# Patient Record
Sex: Female | Born: 1990 | Race: Black or African American | Hispanic: No | Marital: Single | Smoking: Former smoker
Health system: Southern US, Community
[De-identification: ages and names within clinical notes are randomized; demographics above are authoritative.]

## PROBLEM LIST (undated history)

## (undated) ENCOUNTER — Inpatient Hospital Stay (HOSPITAL_COMMUNITY): Payer: Self-pay

## (undated) DIAGNOSIS — E669 Obesity, unspecified: Secondary | ICD-10-CM

## (undated) DIAGNOSIS — O24419 Gestational diabetes mellitus in pregnancy, unspecified control: Secondary | ICD-10-CM

## (undated) DIAGNOSIS — F329 Major depressive disorder, single episode, unspecified: Secondary | ICD-10-CM

## (undated) DIAGNOSIS — F410 Panic disorder [episodic paroxysmal anxiety] without agoraphobia: Secondary | ICD-10-CM

## (undated) DIAGNOSIS — D473 Essential (hemorrhagic) thrombocythemia: Secondary | ICD-10-CM

## (undated) DIAGNOSIS — E119 Type 2 diabetes mellitus without complications: Secondary | ICD-10-CM

## (undated) DIAGNOSIS — A5901 Trichomonal vulvovaginitis: Secondary | ICD-10-CM

## (undated) DIAGNOSIS — F32A Depression, unspecified: Secondary | ICD-10-CM

## (undated) HISTORY — PX: TONSILLECTOMY AND ADENOIDECTOMY: SHX28

## (undated) HISTORY — PX: MOUTH SURGERY: SHX715

## (undated) HISTORY — PX: TONSILLECTOMY: SUR1361

---
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
## 2009-02-22 ENCOUNTER — Inpatient Hospital Stay (HOSPITAL_COMMUNITY): Admission: AD | Admit: 2009-02-22 | Discharge: 2009-02-22 | Payer: Self-pay | Admitting: Obstetrics

## 2009-02-22 ENCOUNTER — Ambulatory Visit: Payer: Self-pay | Admitting: Obstetrics and Gynecology

## 2009-03-23 ENCOUNTER — Ambulatory Visit (HOSPITAL_COMMUNITY): Admission: RE | Admit: 2009-03-23 | Discharge: 2009-03-23 | Payer: Self-pay | Admitting: Obstetrics

## 2009-04-26 ENCOUNTER — Inpatient Hospital Stay (HOSPITAL_COMMUNITY): Admission: AD | Admit: 2009-04-26 | Discharge: 2009-04-26 | Payer: Self-pay | Admitting: Obstetrics

## 2009-05-14 ENCOUNTER — Inpatient Hospital Stay (HOSPITAL_COMMUNITY): Admission: AD | Admit: 2009-05-14 | Discharge: 2009-05-14 | Payer: Self-pay | Admitting: Obstetrics

## 2009-05-16 ENCOUNTER — Inpatient Hospital Stay (HOSPITAL_COMMUNITY): Admission: AD | Admit: 2009-05-16 | Discharge: 2009-05-16 | Payer: Self-pay | Admitting: Obstetrics

## 2009-05-26 ENCOUNTER — Inpatient Hospital Stay (HOSPITAL_COMMUNITY): Admission: AD | Admit: 2009-05-26 | Discharge: 2009-05-28 | Payer: Self-pay | Admitting: Obstetrics

## 2009-06-08 ENCOUNTER — Inpatient Hospital Stay (HOSPITAL_COMMUNITY): Admission: AD | Admit: 2009-06-08 | Discharge: 2009-06-09 | Payer: Self-pay | Admitting: Obstetrics

## 2009-07-10 ENCOUNTER — Encounter: Admission: RE | Admit: 2009-07-10 | Discharge: 2009-07-10 | Payer: Self-pay | Admitting: Family Medicine

## 2009-12-18 ENCOUNTER — Emergency Department (HOSPITAL_COMMUNITY): Admission: EM | Admit: 2009-12-18 | Discharge: 2009-12-18 | Payer: Self-pay | Admitting: Emergency Medicine

## 2010-01-31 ENCOUNTER — Emergency Department (HOSPITAL_COMMUNITY): Admission: EM | Admit: 2010-01-31 | Discharge: 2010-01-31 | Payer: Self-pay | Admitting: Emergency Medicine

## 2010-05-31 ENCOUNTER — Emergency Department (HOSPITAL_COMMUNITY): Admission: EM | Admit: 2010-05-31 | Discharge: 2010-05-31 | Payer: Self-pay | Admitting: Emergency Medicine

## 2010-11-08 LAB — POCT CARDIAC MARKERS
CKMB, poc: <1 ng/mL — ABNORMAL LOW (ref 1.0–8.0)
Myoglobin, poc: 78 ng/mL (ref 12–200)
Troponin i, poc: <0.05 ng/mL (ref 0.00–0.09)

## 2010-11-08 LAB — POCT I-STAT, CHEM 8
BUN: 5 mg/dL — ABNORMAL LOW (ref 6–23)
Calcium, Ion: 1.11 mmol/L — ABNORMAL LOW (ref 1.12–1.32)
Chloride: 104 mEq/L (ref 96–112)
Creatinine, Ser: 1 mg/dL (ref 0.4–1.2)
Glucose, Bld: 90 mg/dL (ref 70–99)
HCT: 42 % (ref 36.0–46.0)
Hemoglobin: 14.3 g/dL (ref 12.0–15.0)
Potassium: 3.6 mEq/L (ref 3.5–5.1)
Sodium: 140 mEq/L (ref 135–145)
TCO2: 26 mmol/L (ref 0–100)

## 2010-11-08 LAB — POCT PREGNANCY, URINE: Preg Test, Ur: NEGATIVE

## 2010-11-08 LAB — D-DIMER, QUANTITATIVE: D-Dimer, Quant: 0.64 ug/mL-FEU — ABNORMAL HIGH (ref 0.00–0.48)

## 2010-11-25 LAB — CBC
HCT: 31.1 % — ABNORMAL LOW (ref 36.0–46.0)
HCT: 35.7 % — ABNORMAL LOW (ref 36.0–46.0)
Hemoglobin: 10.2 g/dL — ABNORMAL LOW (ref 12.0–15.0)
Hemoglobin: 11.5 g/dL — ABNORMAL LOW (ref 12.0–15.0)
MCHC: 32.2 g/dL (ref 30.0–36.0)
MCHC: 32.9 g/dL (ref 30.0–36.0)
MCV: 82.5 fL (ref 78.0–100.0)
MCV: 82.7 fL (ref 78.0–100.0)
Platelets: 354 10*3/uL (ref 150–400)
Platelets: 375 10*3/uL (ref 150–400)
RBC: 3.78 MIL/uL — ABNORMAL LOW (ref 3.87–5.11)
RBC: 4.32 MIL/uL (ref 3.87–5.11)
RDW: 13.6 % (ref 11.5–15.5)
RDW: 14.4 % (ref 11.5–15.5)
WBC: 11.9 10*3/uL — ABNORMAL HIGH (ref 4.0–10.5)
WBC: 21.2 10*3/uL — ABNORMAL HIGH (ref 4.0–10.5)

## 2010-11-25 LAB — CULTURE, ROUTINE-ABSCESS

## 2010-11-25 LAB — RPR: RPR Ser Ql: NONREACTIVE

## 2010-11-26 LAB — URINE CULTURE: Colony Count: 2000

## 2010-11-26 LAB — URINALYSIS, ROUTINE W REFLEX MICROSCOPIC
Bilirubin Urine: NEGATIVE
Glucose, UA: NEGATIVE mg/dL
Hgb urine dipstick: NEGATIVE
Ketones, ur: NEGATIVE mg/dL
Nitrite: NEGATIVE
Protein, ur: NEGATIVE mg/dL
Specific Gravity, Urine: 1.025 (ref 1.005–1.030)
Urobilinogen, UA: 0.2 mg/dL (ref 0.0–1.0)
pH: 6 (ref 5.0–8.0)

## 2010-11-26 LAB — GC/CHLAMYDIA PROBE AMP, GENITAL
Chlamydia, DNA Probe: NEGATIVE
GC Probe Amp, Genital: NEGATIVE

## 2010-11-26 LAB — WET PREP, GENITAL: Trich, Wet Prep: NONE SEEN

## 2010-11-26 LAB — URINE MICROSCOPIC-ADD ON

## 2010-11-28 LAB — CBC
HCT: 36.5 % (ref 36.0–46.0)
Hemoglobin: 12.3 g/dL (ref 12.0–15.0)
MCHC: 33.6 g/dL (ref 30.0–36.0)
MCV: 85 fL (ref 78.0–100.0)
Platelets: 372 10*3/uL (ref 150–400)
RBC: 4.3 MIL/uL (ref 3.87–5.11)
RDW: 13.1 % (ref 11.5–15.5)
WBC: 11.1 10*3/uL — ABNORMAL HIGH (ref 4.0–10.5)

## 2010-11-28 LAB — KLEIHAUER-BETKE STAIN
Fetal Cells %: 0 %
Quantitation Fetal Hemoglobin: 0 mL

## 2010-12-01 LAB — URINALYSIS, ROUTINE W REFLEX MICROSCOPIC
Bilirubin Urine: NEGATIVE
Glucose, UA: NEGATIVE mg/dL
Hgb urine dipstick: NEGATIVE
Ketones, ur: NEGATIVE mg/dL
Nitrite: NEGATIVE
Protein, ur: NEGATIVE mg/dL
Specific Gravity, Urine: 1.02 (ref 1.005–1.030)
Urobilinogen, UA: 0.2 mg/dL (ref 0.0–1.0)
pH: 7.5 (ref 5.0–8.0)

## 2010-12-01 LAB — GC/CHLAMYDIA PROBE AMP, GENITAL
Chlamydia, DNA Probe: NEGATIVE
GC Probe Amp, Genital: NEGATIVE

## 2010-12-01 LAB — URINE MICROSCOPIC-ADD ON

## 2010-12-01 LAB — WET PREP, GENITAL: Clue Cells Wet Prep HPF POC: NONE SEEN

## 2010-12-04 ENCOUNTER — Emergency Department (HOSPITAL_COMMUNITY)
Admission: EM | Admit: 2010-12-04 | Discharge: 2010-12-05 | Discharge status: Against Medical Advice | Payer: Self-pay | Attending: Emergency Medicine | Admitting: Emergency Medicine

## 2010-12-06 LAB — POCT CARDIAC MARKERS
CKMB, poc: 1.6 ng/mL (ref 1.0–8.0)
Myoglobin, poc: 68.8 ng/mL (ref 12–200)
Troponin i, poc: <0.05 ng/mL (ref 0.00–0.09)

## 2010-12-06 LAB — RAPID URINE DRUG SCREEN, HOSP PERFORMED
Amphetamines: NOT DETECTED
Barbiturates: NOT DETECTED
Benzodiazepines: NOT DETECTED
Cocaine: NOT DETECTED
Opiates: NOT DETECTED
Tetrahydrocannabinol: NOT DETECTED

## 2010-12-06 LAB — POCT PREGNANCY, URINE: Preg Test, Ur: NEGATIVE

## 2010-12-06 LAB — URINALYSIS, ROUTINE W REFLEX MICROSCOPIC
Bilirubin Urine: NEGATIVE
Glucose, UA: NEGATIVE mg/dL
Hgb urine dipstick: NEGATIVE
Ketones, ur: NEGATIVE mg/dL
Nitrite: NEGATIVE
Protein, ur: NEGATIVE mg/dL
Specific Gravity, Urine: 1.002 — ABNORMAL LOW (ref 1.005–1.030)
Urobilinogen, UA: 0.2 mg/dL (ref 0.0–1.0)
pH: 7.5 (ref 5.0–8.0)

## 2010-12-06 LAB — POCT I-STAT, CHEM 8
BUN: 5 mg/dL — ABNORMAL LOW (ref 6–23)
Calcium, Ion: 1.21 mmol/L (ref 1.12–1.32)
Chloride: 100 mEq/L (ref 96–112)
Creatinine, Ser: 1 mg/dL (ref 0.4–1.2)
Glucose, Bld: 98 mg/dL (ref 70–99)
HCT: 44 % (ref 36.0–46.0)
Hemoglobin: 15 g/dL (ref 12.0–15.0)
Potassium: 3.6 mEq/L (ref 3.5–5.1)
Sodium: 142 mEq/L (ref 135–145)
TCO2: 28 mmol/L (ref 0–100)

## 2010-12-06 LAB — ETHANOL: Alcohol, Ethyl (B): <5 mg/dL (ref 0–10)

## 2011-01-04 NOTE — Op Note (Signed)
[name], [name]             ACCOUNT NO.:  [id number]   MEDICAL RECORD NO.:  [id number]          PATIENT TYPE:  AMB   LOCATION:  DSC                          FACILITY:  [facility]   PHYSICIAN:  Christopher E. Ezzard Standing, M.D.DATE OF BIRTH:  [birth year]   DATE OF PROCEDURE:  07/17/2007  DATE OF DISCHARGE:                               OPERATIVE REPORT   PREOPERATIVE DIAGNOSES:  1. Recurrent tonsillitis.  2. Adenotonsillar hypertrophy.   POSTOPERATIVE DIAGNOSES:  1. Recurrent tonsillitis.  2. Adenotonsillar hypertrophy.   OPERATION:  Tonsillectomy and adenoidectomy.   SURGEON:  Kristine Garbe. Ezzard Standing, M.D.   ANESTHESIA:  General endotracheal.   COMPLICATIONS:  None.   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  She has missed several days of  school because of recurrent tonsil infections.  On exam she has large 2+  tonsils as well as some large adenoid tissue.  She is taken to the  operating room at this time for tonsillectomy and adenoidectomy.   DESCRIPTION OF PROCEDURE:  After adequate endotracheal anesthesia,  Phyllis Dalton received 1 g of Ancef IV preoperatively as well as 10 mg of  Decadron.  A mouth gag was used to expose the oropharynx.  The left and  right tonsils were then resected from the tonsillar fossa using cautery.  Care was taken to preserve the anterior and posterior tonsillar pillars  as well as the uvula.  Hemostasis was obtained with the cautery.  Following this, a rubber catheter was passed through the nose and out  the mouth retract the soft palate.  The nasopharynx was examined.  Phyllis Dalton had large adenoid tissue.  An adenoid curette was used to remove  the central pad of adenoid tissue.  Nasopharyngeal packs were placed for  hemostasis.  These were then removed, and further hemostasis was  obtained with suction cautery.  At  completion of adenoidectomy, the nose  and nasopharynx were irrigated with saline.  This completed the  procedure.  Phyllis Dalton was awakened from anesthesia and transferred to the  recovery room postoperatively, doing well.   DISPOSITION:  Phyllis Dalton will be observed overnight in the recovery care  center and discharged home the morning on amoxicillin suspension 400 mg  t.i.d. for 1 week, Tylenol or Lortab elixir 1 to 1-1/2 tbsp q.4 h.  p.r.n. pain.  I will have her follow up in my office in 2 weeks for  recheck..           ______________________________  Kristine Garbe Ezzard Standing, M.D.     CEN/MEDQ  D:  07/17/2007  T:  07/17/2007  Job:  [id number]   cc:   Fanny Dance, M.D.

## 2011-05-24 LAB — DIFFERENTIAL
Basophils Absolute: 0.1
Basophils Relative: 1
Eosinophils Absolute: 0.1
Eosinophils Relative: 1
Lymphocytes Relative: 22
Lymphs Abs: 1.6
Monocytes Absolute: 0.8
Monocytes Relative: 10
Neutro Abs: 5
Neutrophils Relative %: 66

## 2011-05-24 LAB — CBC
HCT: 38.9
Hemoglobin: 12.7
MCHC: 32.6
MCV: 83.5
Platelets: 399
RBC: 4.66
RDW: 13.1
WBC: 7.6

## 2011-05-24 LAB — BASIC METABOLIC PANEL
BUN: 10
CO2: 27
Calcium: 9.4
Chloride: 105
Creatinine, Ser: 0.82
GFR calc Af Amer: >60
GFR calc non Af Amer: >60
Glucose, Bld: 78
Potassium: 4.2
Sodium: 138

## 2011-05-24 LAB — D-DIMER, QUANTITATIVE: D-Dimer, Quant: 0.45

## 2011-05-31 LAB — POCT HEMOGLOBIN-HEMACUE
Hemoglobin: 13.3
Operator id: 116011

## 2011-07-16 ENCOUNTER — Encounter: Payer: Self-pay | Admitting: *Deleted

## 2011-07-16 ENCOUNTER — Emergency Department (HOSPITAL_COMMUNITY)
Admission: EM | Admit: 2011-07-16 | Discharge: 2011-07-16 | Disposition: A | Discharge status: Home / Self Care | Payer: Self-pay | Attending: Emergency Medicine | Admitting: Emergency Medicine

## 2011-07-16 DIAGNOSIS — IMO0001 Reserved for inherently not codable concepts without codable children: Secondary | ICD-10-CM | POA: Insufficient documentation

## 2011-07-16 DIAGNOSIS — R05 Cough: Secondary | ICD-10-CM | POA: Insufficient documentation

## 2011-07-16 DIAGNOSIS — J069 Acute upper respiratory infection, unspecified: Secondary | ICD-10-CM | POA: Insufficient documentation

## 2011-07-16 DIAGNOSIS — F17200 Nicotine dependence, unspecified, uncomplicated: Secondary | ICD-10-CM | POA: Insufficient documentation

## 2011-07-16 DIAGNOSIS — R059 Cough, unspecified: Secondary | ICD-10-CM | POA: Insufficient documentation

## 2011-07-16 MED ORDER — IBUPROFEN 800 MG PO TABS: 800.0000 mg | ORAL_TABLET | Freq: Three times a day (TID) | ORAL | Status: AC

## 2011-07-16 MED ORDER — AZITHROMYCIN 250 MG PO TABS: ORAL_TABLET | ORAL | Status: AC

## 2011-07-16 MED ORDER — IBUPROFEN 800 MG PO TABS
800.0000 mg | ORAL_TABLET | Freq: Once | ORAL | Status: AC
  Administered 2011-07-16: 800 mg via ORAL
  Filled 2011-07-16: qty 1

## 2011-07-16 NOTE — ED Provider Notes (Signed)
History     CSN: [id number] Arrival date & time: 07/16/2011 12:53 PM   First MD Initiated Contact with Patient 07/16/11 1400      Chief Complaint  Patient presents with  . Chills  . Generalized Body Aches  . Headache    (Consider location/radiation/quality/duration/timing/severity/associated sxs/prior treatment) HPI  Patient presents to emergency department complaining of a few day history of overall body aches, sinus pressure, nasal drainage, and intermittent headaches. Patient has taken over-the-counter cough and cold remedies without relief of symptoms. Patient states she's felt feverish but no known temperature. Denies neck stiffness, rash, nausea, vomiting, chest pain, shortness of breath, wheezing, abdominal pain, nausea, vomiting, diarrhea. Patient states she takes no medicine on a regular basis has no known medical problems. Denies aggravating or alleviating factors. The bones were gradual onset, persistent, and worsening.  History reviewed. No pertinent past medical history.  History reviewed. No pertinent past surgical history.  No family history on file.  History  Substance Use Topics  . Smoking status: Current Everyday Smoker  . Smokeless tobacco: Not on file  . Alcohol Use: Yes    OB History    Grav Para Term Preterm Abortions TAB SAB Ect Mult Living                  Review of Systems  All other systems reviewed and are negative.    Allergies  Review of patient's allergies indicates no known allergies.  Home Medications   Current Outpatient Rx  Name Route Sig Dispense Refill  . DIPHENHYDRAMINE HCL 25 MG PO CAPS Oral Take 50 mg by mouth every 6 (six) hours as needed. For decongestant.       BP 128/78  Pulse 108  Temp(Src) 98.4 F (36.9 C) (Oral)  Resp 23  SpO2 99%  Physical Exam  Vitals reviewed. Constitutional: She is oriented to person, place, and time. She appears well-developed and well-nourished. No distress.  HENT:  Head:  Normocephalic and atraumatic.  Right Ear: External ear normal.  Left Ear: External ear normal.  Nose: Nose normal.  Mouth/Throat: No oropharyngeal exudate.       Mild erythema of posterior pharynx and tonsils no tonsillar exudate or enlargement. Patent airway. Swallowing secretions well  Eyes: Conjunctivae and EOM are normal. Pupils are equal, round, and reactive to light.  Neck: Normal range of motion. Neck supple.  Cardiovascular: Normal rate, regular rhythm and normal heart sounds.  Exam reveals no gallop and no friction rub.   No murmur heard. Pulmonary/Chest: Effort normal and breath sounds normal. No respiratory distress. She has no wheezes. She has no rales. She exhibits no tenderness.  Abdominal: Soft. She exhibits no distension and no mass. There is no tenderness. There is no rebound and no guarding.  Lymphadenopathy:    She has no cervical adenopathy.  Neurological: She is alert and oriented to person, place, and time. She has normal reflexes.  Skin: Skin is warm and dry. No rash noted. She is not diaphoretic.  Psychiatric: She has a normal mood and affect.    ED Course  Procedures (including critical care time)  Labs Reviewed - No data to display No results found.   1. Cough   2. Upper respiratory tract infection       MDM  Patient is afebrile, nontoxic-appearing, in no acute distress. Pulse ox is 99% on room air. Patient has signs and symptoms of upper respiratory tract infection with cough. Patient is requesting antibiotic. Spoke at length with patient about  likely origin of upper respiratory tract infections being viral however patient insists on antibiotics.        Jenness Corner, [place] 07/16/11 1555

## 2011-07-16 NOTE — ED Notes (Signed)
Upper resp symptoms with chills, body aches, headache,

## 2011-07-16 NOTE — ED Provider Notes (Signed)
Medical screening examination/treatment/procedure(s) were performed by non-physician practitioner and as supervising physician I was immediately available for consultation/collaboration. Iva Knapp, MD, FACEP   Iva L Knapp, MD 07/16/11 1610 

## 2011-10-04 ENCOUNTER — Encounter (HOSPITAL_COMMUNITY): Payer: Self-pay | Admitting: *Deleted

## 2011-10-04 ENCOUNTER — Emergency Department (HOSPITAL_COMMUNITY)
Admission: EM | Admit: 2011-10-04 | Discharge: 2011-10-05 | Disposition: A | Discharge status: Home / Self Care | Payer: Medicaid Other | Attending: Emergency Medicine | Admitting: Emergency Medicine

## 2011-10-04 DIAGNOSIS — B3731 Acute candidiasis of vulva and vagina: Secondary | ICD-10-CM | POA: Insufficient documentation

## 2011-10-04 DIAGNOSIS — F17200 Nicotine dependence, unspecified, uncomplicated: Secondary | ICD-10-CM | POA: Insufficient documentation

## 2011-10-04 DIAGNOSIS — A599 Trichomoniasis, unspecified: Secondary | ICD-10-CM

## 2011-10-04 DIAGNOSIS — M549 Dorsalgia, unspecified: Secondary | ICD-10-CM | POA: Insufficient documentation

## 2011-10-04 DIAGNOSIS — B373 Candidiasis of vulva and vagina: Secondary | ICD-10-CM

## 2011-10-04 DIAGNOSIS — R109 Unspecified abdominal pain: Secondary | ICD-10-CM | POA: Insufficient documentation

## 2011-10-04 DIAGNOSIS — N898 Other specified noninflammatory disorders of vagina: Secondary | ICD-10-CM | POA: Insufficient documentation

## 2011-10-04 LAB — URINALYSIS, ROUTINE W REFLEX MICROSCOPIC
Bilirubin Urine: NEGATIVE
Glucose, UA: NEGATIVE mg/dL
Hgb urine dipstick: NEGATIVE
Ketones, ur: NEGATIVE mg/dL
Nitrite: NEGATIVE
Protein, ur: NEGATIVE mg/dL
Specific Gravity, Urine: 1.025 (ref 1.005–1.030)
Urobilinogen, UA: 0.2 mg/dL (ref 0.0–1.0)
pH: 7.5 (ref 5.0–8.0)

## 2011-10-04 LAB — URINE MICROSCOPIC-ADD ON

## 2011-10-04 LAB — PREGNANCY, URINE: Preg Test, Ur: NEGATIVE

## 2011-10-04 NOTE — ED Notes (Signed)
MD at bedside. 

## 2011-10-04 NOTE — ED Notes (Signed)
Pt in c/o generalized abd pain and lower back pain, also vaginal discharge and constipation

## 2011-10-05 LAB — WET PREP, GENITAL: Yeast Wet Prep HPF POC: NONE SEEN

## 2011-10-05 LAB — GC/CHLAMYDIA PROBE AMP, GENITAL
Chlamydia, DNA Probe: NEGATIVE
GC Probe Amp, Genital: NEGATIVE

## 2011-10-05 MED ORDER — ONDANSETRON 4 MG PO TBDP
4.0000 mg | ORAL_TABLET | Freq: Once | ORAL | Status: AC
  Administered 2011-10-05: 4 mg via ORAL
  Filled 2011-10-05: qty 1

## 2011-10-05 MED ORDER — AZITHROMYCIN 250 MG PO TABS
1000.0000 mg | ORAL_TABLET | Freq: Once | ORAL | Status: AC
  Administered 2011-10-05: 1000 mg via ORAL
  Filled 2011-10-05: qty 4

## 2011-10-05 MED ORDER — LIDOCAINE HCL 1 % IJ SOLN
INTRAMUSCULAR | Status: AC
  Administered 2011-10-05: 01:00:00
  Filled 2011-10-05: qty 20

## 2011-10-05 MED ORDER — FLUCONAZOLE 200 MG PO TABS: 200.0000 mg | ORAL_TABLET | Freq: Every day | ORAL | Status: AC

## 2011-10-05 MED ORDER — METRONIDAZOLE 500 MG PO TABS: 500.0000 mg | ORAL_TABLET | Freq: Three times a day (TID) | ORAL | Status: AC

## 2011-10-05 MED ORDER — CEFTRIAXONE SODIUM 250 MG IJ SOLR
250.0000 mg | Freq: Once | INTRAMUSCULAR | Status: AC
  Administered 2011-10-05: 250 mg via INTRAMUSCULAR
  Filled 2011-10-05: qty 250

## 2011-10-05 NOTE — ED Provider Notes (Signed)
History    21 year old female with lower abdominal pain. Gradual onset yesterday. Crampy in nature without radiation. Pain does not lateralize. Patient is also complaining of thick white vaginal discharge which is going on for couple days. No urinary complaints. No fever or chills. Last menstrual period was the end of January and normal for her. Does not think she is pregnant. No fever or chills. Denies history of abdominal or pelvic surgery. No nausea or vomiting  CSN: [id number]  Arrival date & time 10/04/11  1949   First MD Initiated Contact with Patient 10/04/11 2213      Chief Complaint  Patient presents with  . Abdominal Pain  . Back Pain    (Consider location/radiation/quality/duration/timing/severity/associated sxs/prior treatment) HPI  History reviewed. No pertinent past medical history.  History reviewed. No pertinent past surgical history.  History reviewed. No pertinent family history.  History  Substance Use Topics  . Smoking status: Current Everyday Smoker  . Smokeless tobacco: Not on file  . Alcohol Use: Yes    OB History    Grav Para Term Preterm Abortions TAB SAB Ect Mult Living                  Review of Systems   Review of symptoms negative unless otherwise noted in HPI.   Allergies  Review of patient's allergies indicates no known allergies.  Home Medications   Current Outpatient Rx  Name Route Sig Dispense Refill  . DIPHENHYDRAMINE HCL 25 MG PO CAPS Oral Take 50 mg by mouth every 6 (six) hours as needed. For decongestant.     Marland Kitchen OVER THE COUNTER MEDICATION Vaginal Place 1 application vaginally once. Monastat 1 day treatment.    Marland Kitchen FLUCONAZOLE 200 MG PO TABS Oral Take 1 tablet (200 mg total) by mouth daily. 2 tablet 0    Take one dose now and one dose after your finish y ...  . METRONIDAZOLE 500 MG PO TABS Oral Take 1 tablet (500 mg total) by mouth 3 (three) times daily. 21 tablet 0    BP 122/70  Pulse 78  Temp(Src) 98 F (36.7 C) (Oral)   Resp 20  SpO2 100%  Physical Exam  Nursing note and vitals reviewed. Constitutional: She appears well-developed and well-nourished. No distress.  HENT:  Head: Normocephalic and atraumatic.  Eyes: Conjunctivae are normal. Right eye exhibits no discharge. Left eye exhibits no discharge.  Neck: Neck supple.  Cardiovascular: Normal rate, regular rhythm and normal heart sounds.  Exam reveals no gallop and no friction rub.   No murmur heard. Pulmonary/Chest: Effort normal and breath sounds normal. No respiratory distress.  Abdominal: Soft. She exhibits no distension. There is no tenderness. There is no rebound and no guarding.  Genitourinary:       Normal external femoral genitalia. There is no adnexal adenopathy. No external lesions noted. There is thick clumpy vaginal discharge. No cervical lesions noted. No blood. There is no CMT, adnexal mass or adnexal tenderness. No costovertebral angle tenderness.  Musculoskeletal: She exhibits no edema and no tenderness.       No midline spinal tenderness.   Neurological: She is alert.  Skin: Skin is warm and dry.  Psychiatric: She has a normal mood and affect. Her behavior is normal. Thought content normal.    ED Course  Procedures (including critical care time)  Labs Reviewed  URINALYSIS, ROUTINE W REFLEX MICROSCOPIC - Abnormal; Notable for the following:    APPearance CLOUDY (*)    Leukocytes, UA SMALL (*)  All other components within normal limits  URINE MICROSCOPIC-ADD ON - Abnormal; Notable for the following:    Squamous Epithelial / LPF FEW (*)    All other components within normal limits  PREGNANCY, URINE  GC/CHLAMYDIA PROBE AMP, GENITAL  WET PREP, GENITAL   No results found.   1. Vaginal Discharge   2. Trichomonas   3. Yeast infection of the vagina       MDM  22 year old female with abdominal pain and vaginal discharge. Exam was consistent with yeast infection. Patient's urine also showed trichomonas. Discuss with patient  STD testing and treatment. Patient elected for empiric treatment of GC. There is no CMT, adnexal mass and adnexal tenderness. Abdominal exam is benign. Very low suspicion for surgical abdomen. Patient was treated in the ER for [facility] and given prescription for continued treatment for trichomonas and yeast infection. Patient is also complaining of lower back pain. This is chronic in nature and patient is to for over 2 years. There is no acute trauma. There is no fever, no neurological complaints and has a nonfocal neurological examination. Suspect that imaging of very little utility. Patient denies history of IV drug abuse. Plan symptomatic treatment including NSAIDs .        Raeford Razor, MD 10/05/11 0030

## 2011-10-05 NOTE — Discharge Instructions (Signed)
Vaginitis Vaginitis is when the vagina is sore, puffy (swollen), and red (inflamed). HOME CARE  Take your medicine as told. Finish them even if you start to feel better.   Do not have sex (intercourse) until treatment is done or as told by your doctor.   Take warm baths or sit in warm water (sitz bath).   Do not douche.   Do not use tampons, especially scented ones.   Wear cotton underwear.   Do not wear tight pants or pantyhose.   Tell your sex partner that you have a yeast infection. Your partner should see a doctor if symptoms appear, such as a mild rash or itching.   Your sex partner should be treated if your infection is hard to get rid of.   Use a cream to stop the itching. Make sure it is approved by your doctor.  GET HELP RIGHT AWAY IF:   The infection does not go away with treatment.   You have a temperature by mouth above 102 F (38.9 C).   You have belly (abdominal) pain.   You have bleeding from the vagina.  MAKE SURE YOU:   Understand these instructions.   Will watch this condition.   Will get help right away if you are not doing well or get worse.  Document Released: 11/04/2008 Document Revised: 04/20/2011 Document Reviewed: 11/04/2008 [facility] Patient Information 2012 [place], [place].Candidal Vulvovaginitis Candidal vulvovaginitis is an infection of the vagina and vulva. The vulva is the skin around the opening of the vagina. This may cause itching and discomfort in and around the vagina.  HOME CARE  Only take medicine as told by your doctor.   Do not have sex (intercourse) until the infection is healed or as told by your doctor.   Practice safe sex.   Tell your sex partner about your infection.   Do not douche or use tampons.   Wear cotton underwear. Do not wear tight pants or panty hose.   Eat yogurt. This may help treat and prevent yeast infections.  GET HELP RIGHT AWAY IF:   You have a fever.   Your problems get worse during treatment  or do not get better in 3 days.   You have discomfort, irritation, or itching in your vagina or vulva area.   You have pain after sex.   You start to get belly (abdominal) pain.  MAKE SURE YOU:  Understand these instructions.   Will watch your condition.   Will get help right away if you are not doing well or get worse.  Document Released: 11/04/2008 Document Revised: 04/20/2011 Document Reviewed: 11/04/2008 [facility] Patient Information 2012 [place], [place].Trichomoniasis Trichomoniasis is an infection, caused by the Trichomonas organism, that affects both women and men. In women, the outer female genitalia and the vagina are affected. In men, the penis is mainly affected, but the prostate and other reproductive organs can also be involved. Trichomoniasis is a sexually transmitted disease (STD) and is most often passed to another person through sexual contact. The majority of people who get trichomoniasis do so from a sexual encounter and are also at risk for other STDs. CAUSES   Sexual intercourse with an infected partner.   It can be present in swimming pools or hot tubs.  SYMPTOMS   Abnormal gray-green frothy vaginal discharge in women.   Vaginal itching and irritation in women.   Itching and irritation of the area outside the vagina in women.   Penile discharge with or without pain in  males.   Inflammation of the urethra (urethritis), causing painful urination.   Bleeding after sexual intercourse.  RELATED COMPLICATIONS  Pelvic inflammatory disease.   Infection of the uterus (endometritis).   Infertility.   Tubal (ectopic) pregnancy.   It can be associated with other STDs, including gonorrhea and chlamydia, hepatitis B, and HIV.  COMPLICATIONS DURING PREGNANCY  Early (premature) delivery.   Premature rupture of the membranes (PROM).   Low birth weight.  DIAGNOSIS   Visualization of Trichomonas under the microscope from the vagina discharge.   Ph of the  vagina greater than 4.5, tested with a test tape.   Trich Rapid Test.   Culture of the organism, but this is not usually needed.   It may be found on a Pap test.   Having a "strawberry cervix,"which means the cervix looks very red like a strawberry.  TREATMENT   You may be given medication to fight the infection. Inform your caregiver if you could be or are pregnant. Some medications used to treat the infection should not be taken during pregnancy.   Over-the-counter medications or creams to decrease itching or irritation may be recommended.   Your sexual partner will need to be treated if infected.  HOME CARE INSTRUCTIONS   Take all medication prescribed by your caregiver.   Take over-the-counter medication for itching or irritation as directed by your caregiver.   Do not have sexual intercourse while you have the infection.   Do not douche or wear tampons.   Discuss your infection with your partner, as your partner may have acquired the infection from you. Or, your partner may have been the person who transmitted the infection to you.   Have your sex partner examined and treated if necessary.   Practice safe, informed, and protected sex.   See your caregiver for other STD testing.  SEEK MEDICAL CARE IF:   You still have symptoms after you finish the medication.   You have an oral temperature above 102 F (38.9 C).   You develop belly (abdominal) pain.   You have pain when you urinate.   You have bleeding after sexual intercourse.   You develop a rash.   The medication makes you sick or makes you throw up (vomit).  Document Released: 02/01/2001 Document Revised: 04/20/2011 Document Reviewed: 02/27/2009 [employer] Patient Information 2012 Exit[employer], LLTrichomoniasis Trichomoniasis is an infection, caused by the Trichomonas organism, that affects both women and men. In women, the outer female genitalia and the vagina are affected. In men, the penis is mainly affected,  but the prostate and other reproductive organs can also be involved. Trichomoniasis is a sexually transmitted disease (STD) and is most often passed to another person through sexual contact. The majority of people who get trichomoniasis do so from a sexual encounter and are also at risk for other STDs. CAUSES   Sexual intercourse with an infected partner.   It can be present in swimming pools or hot tubs.  SYMPTOMS   Abnormal gray-green frothy vaginal discharge in women.   Vaginal itching and irritation in women.   Itching and irritation of the area outside the vagina in women.   Penile discharge with or without pain in males.   Inflammation of the urethra (urethritis), causing painful urination.   Bleeding after sexual intercourse.  RELATED COMPLICATIONS  Pelvic inflammatory disease.   Infection of the uterus (endometritis).   Infertility.   Tubal (ectopic) pregnancy.   It can be associated with other STDs, including gonorrhea and  chlamydia, hepatitis B, and HIV.  COMPLICATIONS DURING PREGNANCY  Early (premature) delivery.   Premature rupture of the membranes (PROM).   Low birth weight.  DIAGNOSIS   Visualization of Trichomonas under the microscope from the vagina discharge.   Ph of the vagina greater than 4.5, tested with a test tape.   Trich Rapid Test.   Culture of the organism, but this is not usually needed.   It may be found on a Pap test.   Having a "strawberry cervix,"which means the cervix looks very red like a strawberry.  TREATMENT   You may be given medication to fight the infection. Inform your caregiver if you could be or are pregnant. Some medications used to treat the infection should not be taken during pregnancy.   Over-the-counter medications or creams to decrease itching or irritation may be recommended.   Your sexual partner will need to be treated if infected.  HOME CARE INSTRUCTIONS   Take all medication prescribed by your caregiver.     Take over-the-counter medication for itching or irritation as directed by your caregiver.   Do not have sexual intercourse while you have the infection.   Do not douche or wear tampons.   Discuss your infection with your partner, as your partner may have acquired the infection from you. Or, your partner may have been the person who transmitted the infection to you.   Have your sex partner examined and treated if necessary.   Practice safe, informed, and protected sex.   See your caregiver for other STD testing.  SEEK MEDICAL CARE IF:   You still have symptoms after you finish the medication.   You have an oral temperature above 102 F (38.9 C).   You develop belly (abdominal) pain.   You have pain when you urinate.   You have bleeding after sexual intercourse.   You develop a rash.   The medication makes you sick or makes you throw up (vomit).  Document Released: 02/01/2001 Document Revised: 04/20/2011 Document Reviewed: 02/27/2009 [facility] Patient Information 2012 [place], [place].C.  RESOURCE GUIDE  Dental Problems  Patients with Medicaid: [facility] [id number]                                           [phone] [address] Phone:  [phone]                                                  Phone:  [phone]  If unable to pay or uninsured, contact:  [facility] or [facility]. to become qualified for the adult dental clinic.  Chronic Pain Problems Contact [facility]  [phone] Patients need to be referred by their primary care doctor.  Insufficient Money for Medicine Contact United Way:  call "211" or [facility] [phone].  No Primary Care Doctor Call [facility]  [phone] Other agencies that provide inexpensive medical care    [facility]  [phone]    [facility]  [phone]    [facility]  [phone]  Women's  Clinic  [phone]    [facility]  [phone]    [facility]  [phone]  Psychological Services [facility]  [phone] [facility]  [phone] [facility]   [phone] (emergency services [phone])  Substance Abuse Resources [facility]  9513359855 [facility] [phone] [facility] [place] [phone] Floydene Flock [phone] Residential & Outpatient Substance Abuse Program  239-318-0332  Abuse/Neglect [facility] Child Abuse Hotline (417)320-8811 [facility] Child Abuse Hotline 818-443-8718 (After Hours)  Emergency Shelter [facility] 2164716394  Maternity Homes [facility] at [facility] [place] [facility] [place] 234-596-4032 [facility] 769-041-5334  MRSA Hotline #:   289 025 8388    [facility] Resources  [facility]     [employer]                          [facility] Dept. [address] [place]                       [address]      [address]  Blondell Reveal Phone:  [phone]                                   Phone:  [phone]                 Phone:  [phone]  [facility] Phone:  [phone]  [facility] Child Abuse Hotline 402-621-2249 [phone] (After Hours)

## 2011-12-05 ENCOUNTER — Emergency Department (HOSPITAL_COMMUNITY)
Admission: EM | Admit: 2011-12-05 | Discharge: 2011-12-06 | Disposition: A | Discharge status: Home / Self Care | Payer: Medicaid Other | Attending: Emergency Medicine | Admitting: Emergency Medicine

## 2011-12-05 ENCOUNTER — Encounter (HOSPITAL_COMMUNITY): Payer: Self-pay | Admitting: Emergency Medicine

## 2011-12-05 DIAGNOSIS — M533 Sacrococcygeal disorders, not elsewhere classified: Secondary | ICD-10-CM | POA: Insufficient documentation

## 2011-12-05 DIAGNOSIS — F17200 Nicotine dependence, unspecified, uncomplicated: Secondary | ICD-10-CM | POA: Insufficient documentation

## 2011-12-05 DIAGNOSIS — M545 Low back pain, unspecified: Secondary | ICD-10-CM | POA: Insufficient documentation

## 2011-12-05 DIAGNOSIS — M549 Dorsalgia, unspecified: Secondary | ICD-10-CM

## 2011-12-05 DIAGNOSIS — E669 Obesity, unspecified: Secondary | ICD-10-CM | POA: Insufficient documentation

## 2011-12-05 DIAGNOSIS — R51 Headache: Secondary | ICD-10-CM | POA: Insufficient documentation

## 2011-12-05 DIAGNOSIS — M79609 Pain in unspecified limb: Secondary | ICD-10-CM | POA: Insufficient documentation

## 2011-12-05 DIAGNOSIS — M25539 Pain in unspecified wrist: Secondary | ICD-10-CM | POA: Insufficient documentation

## 2011-12-05 NOTE — ED Notes (Signed)
Pt states she is here for bilateral wrist pain, headache, and low back pain  Denies injury

## 2011-12-05 NOTE — ED Notes (Signed)
Pt. C/o back, head, and bilateral wrist pain.  Back pain radiates down right leg and has been present for several years.  Headache started a week and a half ago and is intermittent.  Pt. Describes head pain as throbbing.  Wrist pain comes and goes and started this past Sunday.

## 2011-12-06 MED ORDER — NAPROXEN 500 MG PO TABS: 500.0000 mg | ORAL_TABLET | Freq: Two times a day (BID) | ORAL | Status: AC

## 2011-12-06 MED ORDER — KETOROLAC TROMETHAMINE 60 MG/2ML IM SOLN
60.0000 mg | Freq: Once | INTRAMUSCULAR | Status: AC
  Administered 2011-12-06: 60 mg via INTRAMUSCULAR
  Filled 2011-12-06: qty 2

## 2011-12-06 NOTE — ED Provider Notes (Signed)
History     CSN: [id number]  Arrival date & time 12/05/11  2125   First MD Initiated Contact with Patient 12/05/11 2254      Chief Complaint  Patient presents with  . Wrist Pain  . Headache    (Consider location/radiation/quality/duration/timing/severity/associated sxs/prior treatment) Patient is a 21 y.o. female presenting with back pain, wrist pain, and headaches. The history is provided by the patient.  Back Pain  This is a chronic problem. The current episode started more than 1 week ago. The problem occurs daily. The problem has not changed since onset.The pain is associated with no known injury. The pain is present in the lumbar spine and sacro-iliac joint. The quality of the pain is described as shooting. The pain radiates to the right thigh. The symptoms are aggravated by bending and twisting. The pain is the same all the time. Associated symptoms include headaches. Pertinent negatives include no fever, no numbness, no abdominal pain, no abdominal swelling, no bowel incontinence, no perianal numbness, no bladder incontinence, no pelvic pain, no paresthesias, no tingling and no weakness. She has tried NSAIDs for the symptoms. Risk factors include obesity.  Wrist Pain This is a new problem. The current episode started yesterday. The problem occurs constantly. The problem has been unchanged. Associated symptoms include headaches. Pertinent negatives include no abdominal pain, fever, joint swelling, myalgias, nausea, numbness, vomiting or weakness. The symptoms are aggravated by bending. She has tried nothing for the symptoms.  Headache  This is a new problem. The current episode started 2 days ago. The problem occurs constantly. The problem has not changed since onset.The headache is associated with nothing. The pain is located in the frontal and bilateral region. The quality of the pain is described as throbbing. The pain does not radiate. Pertinent negatives include no fever, no  malaise/fatigue, no syncope, no nausea and no vomiting. She has tried nothing for the symptoms.  Wrist Pain This is a new problem. The current episode started yesterday. The problem occurs constantly. The problem has been unchanged. Associated symptoms include headaches. Pertinent negatives include no abdominal pain. The symptoms are aggravated by bending. She has tried nothing for the symptoms.   21 year old female who presents with multiple symptoms. She states that she has had chronic back pain for the past 2 years since the birth of her son. Pain is described in the low back, radiating to the SI joints, with occasional shooting pains going down mainly her right leg, but sometimes "switching" to her left leg. The pains radiate down to the level of her knee. Denies any numbness, weakness, inability to bear weight or walk. No bowel or bladder incontinence. Symptoms not significantly changed since onset. She has seen her PCP for this in the past and was given a prescription for ibuprofen, which she states "just isn't working."  She also presents with bilateral wrist pain which started on Sunday. No known injury. She has not noticed any swelling to the area. Denies reduction in range of motion, grip strength.  She also presents with headache. Headache has been present for the past 2 days it is located in the frontal area. No associated photophobia, phonophobia, nausea, vomiting. She's had no chest pain or shortness of breath or diaphoresis. No abdominal pain. She does not have a history of migraine. No treatment at home prior to arrival.   History reviewed. No pertinent past medical history.  History reviewed. No pertinent past surgical history.  Family History  Problem Relation Age of Onset  .  Diabetes Other     History  Substance Use Topics  . Smoking status: Current Everyday Smoker    Types: Cigarettes  . Smokeless tobacco: Not on file  . Alcohol Use: Yes     rare    OB History     Grav Para Term Preterm Abortions TAB SAB Ect Mult Living                  Review of Systems  Constitutional: Negative for fever and malaise/fatigue.  Cardiovascular: Negative for syncope.  Gastrointestinal: Negative for nausea, vomiting, abdominal pain and bowel incontinence.  Genitourinary: Negative for bladder incontinence and pelvic pain.  Musculoskeletal: Positive for back pain. Negative for myalgias and joint swelling.  Neurological: Positive for headaches. Negative for tingling, weakness, numbness and paresthesias.   review of systems as per history of present illness, 10 pt review otherwise negative  Allergies  Review of patient's allergies indicates no known allergies.  Home Medications   Current Outpatient Rx  Name Route Sig Dispense Refill  . IBUPROFEN 400 MG PO TABS Oral Take 400 mg by mouth every 6 (six) hours as needed. For pain relief    . OVER THE COUNTER MEDICATION Vaginal Place 1 application vaginally once. Monastat 1 day treatment.      BP 118/65  Pulse 89  Temp(Src) 99.1 F (37.3 C) (Oral)  Resp 18  SpO2 100%  LMP 11/04/2011  Physical Exam  Nursing note and vitals reviewed. Constitutional: She appears well-developed and well-nourished. No distress.       Obese  HENT:  Head: Normocephalic and atraumatic.  Right Ear: External ear normal.  Left Ear: External ear normal.  Mouth/Throat: Oropharynx is clear and moist. No oropharyngeal exudate.       Tympanic membranes normal bilaterally, no sinus tenderness to palpation.  Eyes: Conjunctivae and EOM are normal. Pupils are equal, round, and reactive to light.  Neck: Normal range of motion. Neck supple.       Negative meningeal signs, neck supple and is not rigid  Cardiovascular: Normal rate, regular rhythm and normal heart sounds.   Pulmonary/Chest: Effort normal and breath sounds normal. She exhibits no tenderness.  Abdominal: Soft. Bowel sounds are normal. There is no tenderness. There is no rebound and no  guarding.  Musculoskeletal:       Right wrist: Normal.       Left wrist: Normal.       Spine: No palpable stepoff, crepitus, or gross deformity appreciated. No midline tenderness. No appreciable spasm of paravertebral muscles. Negative SLR b/l. She does have some tenderness to exam over SI joints, but exam is not completely consistent. Negative axial loading testing.   No focal ttp to wrists or hands, no swelling, crepitus, deformity noted. Grips equal b/l. FROM at wrists. NVI with sensory intact to lt touch in radian, median, ulnar dist.  Lymphadenopathy:    She has no cervical adenopathy.  Neurological: She is alert. No cranial nerve deficit. She exhibits normal muscle tone. Coordination normal.  Reflex Scores:      Achilles reflexes are 2+ on the right side and 2+ on the left side. Skin: Skin is warm and dry. No rash noted. She is not diaphoretic.  Psychiatric: She has a normal mood and affect.    ED Course  Procedures (including critical care time)  Labs Reviewed - No data to display No results found.   1. Wrist pain   2. Back pain   3. Headache  MDM  Pt has no red flags on hx for back pain, no neuro deficits on exam, normal reflexes. No acute findings with wrist pain. No neuro deficits regarding HA.  Patient's records on [place] Controlled Substance Database reviewed. She has received oxycodone #160 on several occasions from a psychiatrist in [place], [place], with the most recent prescription being in February, which is somewhat suspicious. I did not review the database findings with the pt but stated that I did not recommend narcotic treatment given her symptoms, and she was agreeable with this plan. She was given IM Toradol here and discharged home with prescription for naproxen. Instructed to followup with her primary care doctor if she is not improving. RICE discussed. Return precautions discussed.       Grant Fontana, [place] 12/07/11 0155  Grant Fontana,  [place] 12/07/11 [phone]  Medical screening examination/treatment/procedure(s) were performed by non-physician practitioner and as supervising physician I was immediately available for consultation/collaboration.  Sunnie Nielsen, MD 12/07/11 [phone]

## 2011-12-06 NOTE — Discharge Instructions (Signed)
It is important to follow up with Dr. Daphine Deutscher for further evaluation and treatment of your pain. Please take the naproxen as prescribed. Return to the emergency department for numbness, weakness, or otherwise worsening condition.  RESOURCE GUIDE  Dental Problems  Patients with Medicaid: [facility] [phone] [address]                                           [phone] Phone:  [phone]                                                  Phone:  [phone]  If unable to pay or uninsured, contact:  [facility] or [facility]. to become qualified for the adult dental clinic.  Chronic Pain Problems Contact [facility]  [phone] Patients need to be referred by their primary care doctor.  Insufficient Money for Medicine Contact [employer] Way:  call "211" or [facility] (403)171-0906.  No Primary Care Doctor Call [facility]  [phone] Other agencies that provide inexpensive medical care    [facility]  [phone]    [facility]  [phone]    [facility]  [phone]    [facility]  [phone]    [facility]  [phone]    [facility]  [phone]  Psychological Services [facility] Behavioral Health  [phone] [facility]  [phone] [facility] Mental [facility]   [phone] (emergency services ([phone])  Substance Abuse Resources [facility]  769-250-9370 [facility] [phone] [place] [phone] Floydene Flock [phone] Residential & Outpatient Substance Abuse Program  [phone]  Abuse/Neglect [facility] Child Abuse Hotline 519-588-5094 [facility] Child Abuse Hotline 802-768-6663 (After Hours)  Emergency Shelter [facility] [phone]  [facility] [place] 670-671-4549 [facility] [phone]  MRSA  Hotline #:   779-373-2121    [facility] Resources  [facility]     [employer]                          [facility] Dept. [address] [place]                       [address]      [address]  [place]                                                Cristobal Goldmann Phone:  [phone]  Phone:  [phone]                 Phone:  [phone]  [facility] Phone:  [phone]  [facility] Child Abuse Hotline 6821502915 [phone] (After Hours)  Back Exercises Back exercises help treat and prevent back injuries. The goal of back exercises is to increase the strength of your abdominal and back muscles and the flexibility of your back. These exercises should be started when you no longer have back pain. Back exercises include:  Pelvic Tilt. Lie on your back with your knees bent. Tilt your pelvis until the lower part of your back is against the floor. Hold this position 5 to 10 sec and repeat 5 to 10 times.   Knee to Chest. Pull first 1 knee up against your chest and hold for 20 to 30 seconds, repeat this with the other knee, and then both knees. This may be done with the other leg straight or bent, whichever feels better.   Sit-Ups or Curl-Ups. Bend your knees 90 degrees. Start with tilting your pelvis, and do a partial, slow sit-up, lifting your trunk only 30 to 45 degrees off the floor. Take at least 2 to 3 seconds for each sit-up. Do not do sit-ups with your knees out straight. If partial sit-ups are difficult, simply do the above but with only tightening your abdominal muscles and holding it as directed.   Hip-Lift. Lie on your back with your knees flexed 90 degrees. Push down with your feet and shoulders as you raise your hips a couple inches off the floor; hold for 10 seconds, repeat 5 to 10 times.   Back arches. Lie on your stomach, propping  yourself up on bent elbows. Slowly press on your hands, causing an arch in your low back. Repeat 3 to 5 times. Any initial stiffness and discomfort should lessen with repetition over time.   Shoulder-Lifts. Lie face down with arms beside your body. Keep hips and torso pressed to floor as you slowly lift your head and shoulders off the floor.  Do not overdo your exercises, especially in the beginning. Exercises may cause you some mild back discomfort which lasts for a few minutes; however, if the pain is more severe, or lasts for more than 15 minutes, do not continue exercises until you see your caregiver. Improvement with exercise therapy for back problems is slow.  See your caregivers for assistance with developing a proper back exercise program. Document Released: 09/15/2004 Document Revised: 07/28/2011 Document Reviewed: 08/08/2005 [facility] Patient Information 2012 [place], [place].Back Pain, Adult Low back pain is very common. About 1 in 5 people have back pain.The cause of low back pain is rarely dangerous. The pain often gets better over time.About half of people with a sudden onset of back pain feel better in just 2 weeks. About 8 in 10 people feel better by 6 weeks.  CAUSES Some common causes of back pain include:  Strain of the muscles or ligaments supporting the spine.   Wear and tear (degeneration) of the spinal discs.   Arthritis.   Direct injury to the back.  DIAGNOSIS Most of the time, the direct cause of low back pain is not known.However, back pain can be treated effectively even when the exact cause of the pain is unknown.Answering your caregiver's questions about your overall health and symptoms is one of the most accurate ways to make sure the cause of your pain is not dangerous. If your caregiver needs more  information, he or she may order lab work or imaging tests (X-rays or MRIs).However, even if imaging tests show changes in your back, this usually does not require  surgery. HOME CARE INSTRUCTIONS For many people, back pain returns.Since low back pain is rarely dangerous, it is often a condition that people can learn to [facility] their own.   Remain active. It is stressful on the back to sit or stand in one place. Do not sit, drive, or stand in one place for more than 30 minutes at a time. Take short walks on level surfaces as soon as pain allows.Try to increase the length of time you walk each day.   Do not stay in bed.Resting more than 1 or 2 days can delay your recovery.   Do not avoid exercise or work.Your body is made to move.It is not dangerous to be active, even though your back may hurt.Your back will likely heal faster if you return to being active before your pain is gone.   Pay attention to your body when you bend and lift. Many people have less discomfortwhen lifting if they bend their knees, keep the load close to their bodies,and avoid twisting. Often, the most comfortable positions are those that put less stress on your recovering back.   Find a comfortable position to sleep. Use a firm mattress and lie on your side with your knees slightly bent. If you lie on your back, put a pillow under your knees.   Only take over-the-counter or prescription medicines as directed by your caregiver. Over-the-counter medicines to reduce pain and inflammation are often the most helpful.Your caregiver may prescribe muscle relaxant drugs.These medicines help dull your pain so you can more quickly return to your normal activities and healthy exercise.   Put ice on the injured area.   Put ice in a plastic bag.   Place a towel between your skin and the bag.   Leave the ice on for 15 to 20 minutes, 3 to 4 times a day for the first 2 to 3 days. After that, ice and heat may be alternated to reduce pain and spasms.   Ask your caregiver about trying back exercises and gentle massage. This may be of some benefit.   Avoid feeling anxious or  stressed.Stress increases muscle tension and can worsen back pain.It is important to recognize when you are anxious or stressed and learn ways to manage it.Exercise is a great option.  SEEK MEDICAL CARE IF:  You have pain that is not relieved with rest or medicine.   You have pain that does not improve in 1 week.   You have new symptoms.   You are generally not feeling well.  SEEK IMMEDIATE MEDICAL CARE IF:   You have pain that radiates from your back into your legs.   You develop new bowel or bladder control problems.   You have unusual weakness or numbness in your arms or legs.   You develop nausea or vomiting.   You develop abdominal pain.   You feel faint.  Document Released: 08/08/2005 Document Revised: 07/28/2011 Document Reviewed: 12/27/2010 [facility] Patient Information 2012 [place], [place].Headaches, Frequently Asked Questions MIGRAINE HEADACHES Q: What is migraine? What causes it? How can I treat it? A: Generally, migraine headaches begin as a dull ache. Then they develop into a constant, throbbing, and pulsating pain. You may experience pain at the temples. You may experience pain at the front or back of one or both sides of the head. The pain  is usually accompanied by a combination of:  Nausea.   Vomiting.   Sensitivity to light and noise.  Some people (about 15%) experience an aura (see below) before an attack. The cause of migraine is believed to be chemical reactions in the brain. Treatment for migraine may include over-the-counter or prescription medications. It may also include self-help techniques. These include relaxation training and biofeedback.  Q: What is an aura? A: About 15% of people with migraine get an "aura". This is a sign of neurological symptoms that occur before a migraine headache. You may see wavy or jagged lines, dots, or flashing lights. You might experience tunnel vision or blind spots in one or both eyes. The aura can include visual or  auditory hallucinations (something imagined). It may include disruptions in smell (such as strange odors), taste or touch. Other symptoms include:  Numbness.   A "pins and needles" sensation.   Difficulty in recalling or speaking the correct word.  These neurological events may last as long as 60 minutes. These symptoms will fade as the headache begins. Q: What is a trigger? A: Certain physical or environmental factors can lead to or "trigger" a migraine. These include:  Foods.   Hormonal changes.   Weather.   Stress.  It is important to remember that triggers are different for everyone. To help prevent migraine attacks, you need to figure out which triggers affect you. Keep a headache diary. This is a good way to track triggers. The diary will help you talk to your healthcare professional about your condition. Q: Does weather affect migraines? A: Bright sunshine, hot, humid conditions, and drastic changes in barometric pressure may lead to, or "trigger," a migraine attack in some people. But studies have shown that weather does not act as a trigger for everyone with migraines. Q: What is the link between migraine and hormones? A: Hormones start and regulate many of your body's functions. Hormones keep your body in balance within a constantly changing environment. The levels of hormones in your body are unbalanced at times. Examples are during menstruation, pregnancy, or menopause. That can lead to a migraine attack. In fact, about three quarters of all women with migraine report that their attacks are related to the menstrual cycle.  Q: Is there an increased risk of stroke for migraine sufferers? A: The likelihood of a migraine attack causing a stroke is very remote. That is not to say that migraine sufferers cannot have a stroke associated with their migraines. In persons under age 44, the most common associated factor for stroke is migraine headache. But over the course of a person's  normal life span, the occurrence of migraine headache may actually be associated with a reduced risk of dying from cerebrovascular disease due to stroke.  Q: What are acute medications for migraine? A: Acute medications are used to treat the pain of the headache after it has started. Examples over-the-counter medications, NSAIDs, ergots, and triptans.  Q: What are the triptans? A: Triptans are the newest class of abortive medications. They are specifically targeted to treat migraine. Triptans are vasoconstrictors. They moderate some chemical reactions in the brain. The triptans work on receptors in your brain. Triptans help to restore the balance of a neurotransmitter called serotonin. Fluctuations in levels of serotonin are thought to be a main cause of migraine.  Q: Are over-the-counter medications for migraine effective? A: Over-the-counter, or "OTC," medications may be effective in relieving mild to moderate pain and associated symptoms of migraine. But you  should see your caregiver before beginning any treatment regimen for migraine.  Q: What are preventive medications for migraine? A: Preventive medications for migraine are sometimes referred to as "prophylactic" treatments. They are used to reduce the frequency, severity, and length of migraine attacks. Examples of preventive medications include antiepileptic medications, antidepressants, beta-blockers, calcium channel blockers, and NSAIDs (nonsteroidal anti-inflammatory drugs). Q: Why are anticonvulsants used to treat migraine? A: During the past few years, there has been an increased interest in antiepileptic drugs for the prevention of migraine. They are sometimes referred to as "anticonvulsants". Both epilepsy and migraine may be caused by similar reactions in the brain.  Q: Why are antidepressants used to treat migraine? A: Antidepressants are typically used to treat people with depression. They may reduce migraine frequency by regulating  chemical levels, such as serotonin, in the brain.  Q: What alternative therapies are used to treat migraine? A: The term "alternative therapies" is often used to describe treatments considered outside the scope of conventional Western medicine. Examples of alternative therapy include acupuncture, acupressure, and yoga. Another common alternative treatment is herbal therapy. Some herbs are believed to relieve headache pain. Always discuss alternative therapies with your caregiver before proceeding. Some herbal products contain arsenic and other toxins. TENSION HEADACHES Q: What is a tension-type headache? What causes it? How can I treat it? A: Tension-type headaches occur randomly. They are often the result of temporary stress, anxiety, fatigue, or anger. Symptoms include soreness in your temples, a tightening band-like sensation around your head (a "vice-like" ache). Symptoms can also include a pulling feeling, pressure sensations, and contracting head and neck muscles. The headache begins in your forehead, temples, or the back of your head and neck. Treatment for tension-type headache may include over-the-counter or prescription medications. Treatment may also include self-help techniques such as relaxation training and biofeedback. CLUSTER HEADACHES Q: What is a cluster headache? What causes it? How can I treat it? A: Cluster headache gets its name because the attacks come in groups. The pain arrives with little, if any, warning. It is usually on one side of the head. A tearing or bloodshot eye and a runny nose on the same side of the headache may also accompany the pain. Cluster headaches are believed to be caused by chemical reactions in the brain. They have been described as the most severe and intense of any headache type. Treatment for cluster headache includes prescription medication and oxygen. SINUS HEADACHES Q: What is a sinus headache? What causes it? How can I treat it? A: When a cavity in  the bones of the face and skull (a sinus) becomes inflamed, the inflammation will cause localized pain. This condition is usually the result of an allergic reaction, a tumor, or an infection. If your headache is caused by a sinus blockage, such as an infection, you will probably have a fever. An x-ray will confirm a sinus blockage. Your caregiver's treatment might include antibiotics for the infection, as well as antihistamines or decongestants.  REBOUND HEADACHES Q: What is a rebound headache? What causes it? How can I treat it? A: A pattern of taking acute headache medications too often can lead to a condition known as "rebound headache." A pattern of taking too much headache medication includes taking it more than 2 days per week or in excessive amounts. That means more than the label or a caregiver advises. With rebound headaches, your medications not only stop relieving pain, they actually begin to cause headaches. Doctors treat rebound headache by  tapering the medication that is being overused. Sometimes your caregiver will gradually substitute a different type of treatment or medication. Stopping may be a challenge. Regularly overusing a medication increases the potential for serious side effects. Consult a caregiver if you regularly use headache medications more than 2 days per week or more than the label advises. ADDITIONAL QUESTIONS AND ANSWERS Q: What is biofeedback? A: Biofeedback is a self-help treatment. Biofeedback uses special equipment to monitor your body's involuntary physical responses. Biofeedback monitors:  Breathing.   Pulse.   Heart rate.   Temperature.   Muscle tension.   Brain activity.  Biofeedback helps you refine and perfect your relaxation exercises. You learn to control the physical responses that are related to stress. Once the technique has been mastered, you do not need the equipment any more. Q: Are headaches hereditary? A: Four out of five (80%) of people that  suffer report a family history of migraine. Scientists are not sure if this is genetic or a family predisposition. Despite the uncertainty, a child has a 50% chance of having migraine if one parent suffers. The child has a 75% chance if both parents suffer.  Q: Can children get headaches? A: By the time they reach high school, most young people have experienced some type of headache. Many safe and effective approaches or medications can prevent a headache from occurring or stop it after it has begun.  Q: What type of doctor should I see to diagnose and treat my headache? A: Start with your primary caregiver. Discuss his or her experience and approach to headaches. Discuss methods of classification, diagnosis, and treatment. Your caregiver may decide to recommend you to a headache specialist, depending upon your symptoms or other physical conditions. Having diabetes, allergies, etc., may require a more comprehensive and inclusive approach to your headache. The National Head[employer] Foundation will provide, upon request, a list of [facility] physician members in your state. Document Released: 10/29/2003 Document Revised: 07/28/2011 Document Reviewed: 04/07/2008 [facility] Patient Information 2012 [place], [place].

## 2013-01-02 ENCOUNTER — Inpatient Hospital Stay (HOSPITAL_COMMUNITY)
Admission: AD | Admit: 2013-01-02 | Discharge: 2013-01-02 | Disposition: A | Discharge status: Home / Self Care | Payer: Medicaid Other | Source: Ambulatory Visit | Attending: Obstetrics and Gynecology | Admitting: Obstetrics and Gynecology

## 2013-01-02 ENCOUNTER — Inpatient Hospital Stay (HOSPITAL_COMMUNITY): Payer: Medicaid Other

## 2013-01-02 ENCOUNTER — Encounter (HOSPITAL_COMMUNITY): Payer: Self-pay | Admitting: *Deleted

## 2013-01-02 DIAGNOSIS — O26899 Other specified pregnancy related conditions, unspecified trimester: Secondary | ICD-10-CM

## 2013-01-02 DIAGNOSIS — O98819 Other maternal infectious and parasitic diseases complicating pregnancy, unspecified trimester: Secondary | ICD-10-CM | POA: Insufficient documentation

## 2013-01-02 DIAGNOSIS — Z3490 Encounter for supervision of normal pregnancy, unspecified, unspecified trimester: Secondary | ICD-10-CM

## 2013-01-02 DIAGNOSIS — R109 Unspecified abdominal pain: Secondary | ICD-10-CM

## 2013-01-02 DIAGNOSIS — O9989 Other specified diseases and conditions complicating pregnancy, childbirth and the puerperium: Secondary | ICD-10-CM

## 2013-01-02 DIAGNOSIS — A5901 Trichomonal vulvovaginitis: Secondary | ICD-10-CM | POA: Insufficient documentation

## 2013-01-02 LAB — HCG, QUANTITATIVE, PREGNANCY: hCG, Beta Chain, Quant, S: 12372 m[IU]/mL — ABNORMAL HIGH (ref <5)

## 2013-01-02 LAB — URINE MICROSCOPIC-ADD ON

## 2013-01-02 LAB — CBC WITH DIFFERENTIAL/PLATELET
Basophils Absolute: 0.1 10*3/uL (ref 0.0–0.1)
Basophils Relative: 1 % (ref 0–1)
Eosinophils Absolute: 0.1 10*3/uL (ref 0.0–0.7)
Eosinophils Relative: 1 % (ref 0–5)
HCT: 39 % (ref 36.0–46.0)
Hemoglobin: 12.9 g/dL (ref 12.0–15.0)
Lymphocytes Relative: 29 % (ref 12–46)
Lymphs Abs: 3.5 10*3/uL (ref 0.7–4.0)
MCH: 26.7 pg (ref 26.0–34.0)
MCHC: 33.1 g/dL (ref 30.0–36.0)
MCV: 80.7 fL (ref 78.0–100.0)
Monocytes Absolute: 0.9 10*3/uL (ref 0.1–1.0)
Monocytes Relative: 8 % (ref 3–12)
Neutro Abs: 7.5 10*3/uL (ref 1.7–7.7)
Neutrophils Relative %: 62 % (ref 43–77)
Platelets: 459 10*3/uL — ABNORMAL HIGH (ref 150–400)
RBC: 4.83 MIL/uL (ref 3.87–5.11)
RDW: 13.6 % (ref 11.5–15.5)
WBC: 12.1 10*3/uL — ABNORMAL HIGH (ref 4.0–10.5)

## 2013-01-02 LAB — URINALYSIS, ROUTINE W REFLEX MICROSCOPIC
Bilirubin Urine: NEGATIVE
Glucose, UA: NEGATIVE mg/dL
Hgb urine dipstick: NEGATIVE
Ketones, ur: NEGATIVE mg/dL
Nitrite: NEGATIVE
Protein, ur: NEGATIVE mg/dL
Specific Gravity, Urine: >1.03 — ABNORMAL HIGH (ref 1.005–1.030)
Urobilinogen, UA: 0.2 mg/dL (ref 0.0–1.0)
pH: 6 (ref 5.0–8.0)

## 2013-01-02 LAB — POCT PREGNANCY, URINE: Preg Test, Ur: POSITIVE — AB

## 2013-01-02 LAB — WET PREP, GENITAL
Trich, Wet Prep: NONE SEEN
Yeast Wet Prep HPF POC: NONE SEEN

## 2013-01-02 MED ORDER — METRONIDAZOLE 500 MG PO TABS
2000.0000 mg | ORAL_TABLET | Freq: Once | ORAL | Status: AC
  Administered 2013-01-02: 2000 mg via ORAL
  Filled 2013-01-02: qty 4

## 2013-01-02 NOTE — MAU Note (Addendum)
Thinks might have a urinary tract infection.  Pain lower left side started this morning.  Has been constipated, had a bowel movement this morning.  Denies pain with urination, no frequency or urgency.

## 2013-01-02 NOTE — MAU Note (Signed)
preg confirmed with primary physician

## 2013-01-02 NOTE — MAU Note (Signed)
Patient states she started having pain this morning about 0900.

## 2013-01-02 NOTE — Progress Notes (Signed)
Pt treated about 4 yrs ago for anxiety

## 2013-01-02 NOTE — MAU Provider Note (Signed)
History     CSN: [id number]  Arrival date and time: 01/02/13 1729   First Provider Initiated Contact with Patient 01/02/13 1929      Chief Complaint  Patient presents with  . Abdominal Pain   HPI Phyllis Dalton is 22 y.o. G2P1001 6 weeks 0 days presenting with abdominal pain that she describes as a sharp pain in the lower abdomen that began this am. Rates 7/10, at this time 5/10.  Hasn't taken anything for pain.  Hard for her to eat.  Denies nausea, vomiting, fever, chills, diarrhea.Denies vaginal bleeding or discharge.  Reports constipation--last BM today but hard. Plans care with Dr. Gaynell Face.  LMP 11/21/12-described as normal.  1 Sexual partner. Hx of Trichomonal infection 2/14, treated.   Past Medical History  Diagnosis Date  . Medical history non-contributory     Past Surgical History  Procedure Laterality Date  . Mouth surgery      Family History  Problem Relation Age of Onset  . Diabetes Other   . Diabetes Mother   . HIV Mother     History  Substance Use Topics  . Smoking status: Former Smoker    Types: Cigarettes    Quit date: 12/25/2012  . Smokeless tobacco: Not on file  . Alcohol Use: No     Comment: rare    Allergies: No Known Allergies  No prescriptions prior to admission    Review of Systems  Constitutional: Negative for fever and chills.  Respiratory: Negative.   Cardiovascular: Negative.   Gastrointestinal: Positive for abdominal pain (cramping). Negative for heartburn, nausea, vomiting, diarrhea and constipation.  Genitourinary: Negative for dysuria, urgency and frequency.       Neg for vaginal bleeding   Physical Exam   Blood pressure 108/62, pulse 90, temperature 98.5 F (36.9 C), temperature source Oral, resp. rate 18, height 5\' 6"  (1.676 m), weight 237 lb (107.502 kg), last menstrual period 11/21/2012.  Physical Exam  Constitutional: She is oriented to person, place, and time. She appears well-developed and well-nourished. No  distress.  HENT:  Head: Normocephalic.  Neck: Normal range of motion.  Cardiovascular: Normal rate.   Respiratory: Effort normal.  GI: Soft. She exhibits no distension and no mass. There is no tenderness. There is no rebound and no guarding.  Genitourinary: There is no rash, tenderness or lesion on the right labia. There is no rash, tenderness or lesion on the left labia. Uterus is tender (mildly). Uterus is not enlarged. Cervix exhibits no discharge and no friability. Right adnexum displays no mass, no tenderness and no fullness. Left adnexum displays no mass, no tenderness and no fullness. No erythema, tenderness or bleeding around the vagina. Vaginal discharge:  positive for vaginal discharge with odor.  Neurological: She is alert and oriented to person, place, and time.  Skin: Skin is warm and dry.  Psychiatric: She has a normal mood and affect. Her behavior is normal.   Results for orders placed during the hospital encounter of 01/02/13 (from the past 24 hour(s))  URINALYSIS, ROUTINE W REFLEX MICROSCOPIC     Status: Abnormal   Collection Time    01/02/13  6:00 PM      Result Value Range   Color, Urine YELLOW  YELLOW   APPearance HAZY (*) CLEAR   Specific Gravity, Urine >1.030 (*) 1.005 - 1.030   pH 6.0  5.0 - 8.0   Glucose, UA NEGATIVE  NEGATIVE mg/dL   Hgb urine dipstick NEGATIVE  NEGATIVE   Bilirubin Urine NEGATIVE  NEGATIVE   Ketones, ur NEGATIVE  NEGATIVE mg/dL   Protein, ur NEGATIVE  NEGATIVE mg/dL   Urobilinogen, UA 0.2  0.0 - 1.0 mg/dL   Nitrite NEGATIVE  NEGATIVE   Leukocytes, UA TRACE (*) NEGATIVE  URINE MICROSCOPIC-ADD ON     Status: Abnormal   Collection Time    01/02/13  6:00 PM      Result Value Range   Squamous Epithelial / LPF MANY (*) RARE   WBC, UA 7-10  <3 WBC/hpf   Bacteria, UA FEW (*) RARE   Urine-Other TRICHOMONAS PRESENT    POCT PREGNANCY, URINE     Status: Abnormal   Collection Time    01/02/13  7:42 PM      Result Value Range   Preg Test, Ur  POSITIVE (*) NEGATIVE  WET PREP, GENITAL     Status: Abnormal   Collection Time    01/02/13  7:58 PM      Result Value Range   Yeast Wet Prep HPF POC NONE SEEN  NONE SEEN   Trich, Wet Prep NONE SEEN  NONE SEEN   Clue Cells Wet Prep HPF POC MODERATE (*) NONE SEEN   WBC, Wet Prep HPF POC FEW (*) NONE SEEN  CBC WITH DIFFERENTIAL     Status: Abnormal   Collection Time    01/02/13  8:05 PM      Result Value Range   WBC 12.1 (*) 4.0 - 10.5 K/uL   RBC 4.83  3.87 - 5.11 MIL/uL   Hemoglobin 12.9  12.0 - 15.0 g/dL   HCT 62.1  30.8 - 65.7 %   MCV 80.7  78.0 - 100.0 fL   MCH 26.7  26.0 - 34.0 pg   MCHC 33.1  30.0 - 36.0 g/dL   RDW 84.6  96.2 - 95.2 %   Platelets 459 (*) 150 - 400 K/uL   Neutrophils Relative % 62  43 - 77 %   Neutro Abs 7.5  1.7 - 7.7 K/uL   Lymphocytes Relative 29  12 - 46 %   Lymphs Abs 3.5  0.7 - 4.0 K/uL   Monocytes Relative 8  3 - 12 %   Monocytes Absolute 0.9  0.1 - 1.0 K/uL   Eosinophils Relative 1  0 - 5 %   Eosinophils Absolute 0.1  0.0 - 0.7 K/uL   Basophils Relative 1  0 - 1 %   Basophils Absolute 0.1  0.0 - 0.1 K/uL   MAU Course  Procedures  MDM  Flagyl 2gms po given in MAU  20:55 Care turned over to V. Smith Assessment and Plan  A:  Cramping in early pregnancy      Trichomonal infection  KEY,EVE M 01/02/2013, 7:44 PM   Care of pt assumed at 2100. Awaiting [place].   US Ob Comp Less 14 Wks  01/02/2013   *RADIOLOGY REPORT*  Clinical Data: Pelvic pain, pregnant.  OBSTETRIC <14 WK [place] AND TRANSVAGINAL OB US  Technique:  Both transabdominal and transvaginal ultrasound examinations were performed for complete evaluation of the gestation as well as the maternal uterus, adnexal regions, and pelvic cul-de-sac.  Transvaginal technique was performed to assess early pregnancy.  Comparison:  None.  Intrauterine gestational sac:  Visualized/normal in shape. Yolk sac: Identified Embryo: Not identified Cardiac Activity: Not applicable  MSD: 11 mm  5 w 5 d [place] EDC: 08/30/2013   Maternal uterus/adnexae: No subchorionic hemorrhage.  Normal sonographic appearance to the ovaries.  No free fluid.  IMPRESSION: Intrauterine  gestational sac and yolk sac.  No embryo at this time. Estimated age of 5 weeks 5 days by mean sac diameter.  Recommend correlation with quantitative beta HCG and follow-up ultrasound as warranted.   Original Report Authenticated By: Jearld Lesch, M.D.   US Ob Transvaginal  01/02/2013   *RADIOLOGY REPORT*  Clinical Data: Pelvic pain, pregnant.  OBSTETRIC <14 WK [place] AND TRANSVAGINAL OB US  Technique:  Both transabdominal and transvaginal ultrasound examinations were performed for complete evaluation of the gestation as well as the maternal uterus, adnexal regions, and pelvic cul-de-sac.  Transvaginal technique was performed to assess early pregnancy.  Comparison:  None.  Intrauterine gestational sac:  Visualized/normal in shape. Yolk sac: Identified Embryo: Not identified Cardiac Activity: Not applicable  MSD: 11 mm  5 w 5 d [place] EDC: 08/30/2013  Maternal uterus/adnexae: No subchorionic hemorrhage.  Normal sonographic appearance to the ovaries.  No free fluid.  IMPRESSION: Intrauterine gestational sac and yolk sac.  No embryo at this time. Estimated age of 5 weeks 5 days by mean sac diameter.  Recommend correlation with quantitative beta HCG and follow-up ultrasound as warranted.   Original Report Authenticated By: Jearld Lesch, M.D.   Assessment: 1. Abdominal pain complicating pregnancy, antepartum   2. Intrauterine pregnancy   3. Trichomonal vaginitis in pregnancy, first trimester    Plan: D/C home Partner need Tx. No IC until 1 week afterward. Always use condoms.  Follow-up Information   Follow up with MARSHALL,BERNARD A, MD. (for prenatal care)    Contact information:   [address] [place] [address] [phone]       Follow up with THE [facility] [place] MATERNITY ADMISSIONS. (As needed for emergencies)     Contact information:   [address] [place] [address] [phone]       Medication List     As of 01/04/2013  8:22 AM    Notice      You have not been prescribed any medications.        [place], CNM 01/04/2013 8:22 AM

## 2013-01-03 LAB — URINE CULTURE
Colony Count: NO GROWTH
Culture: NO GROWTH

## 2013-01-03 LAB — GC/CHLAMYDIA PROBE AMP
CT Probe RNA: POSITIVE — AB
GC Probe RNA: NEGATIVE

## 2013-01-03 LAB — ABO/RH: ABO/RH(D): O POS

## 2013-01-04 ENCOUNTER — Encounter (HOSPITAL_COMMUNITY): Payer: Self-pay | Admitting: Emergency Medicine

## 2013-01-04 ENCOUNTER — Emergency Department (HOSPITAL_COMMUNITY)
Admission: EM | Admit: 2013-01-04 | Discharge: 2013-01-04 | Discharge status: Against Medical Advice | Payer: Medicaid Other | Attending: Emergency Medicine | Admitting: Emergency Medicine

## 2013-01-04 DIAGNOSIS — F410 Panic disorder [episodic paroxysmal anxiety] without agoraphobia: Secondary | ICD-10-CM | POA: Insufficient documentation

## 2013-01-04 DIAGNOSIS — O99340 Other mental disorders complicating pregnancy, unspecified trimester: Secondary | ICD-10-CM | POA: Insufficient documentation

## 2013-01-04 HISTORY — DX: Panic disorder (episodic paroxysmal anxiety): F41.0

## 2013-01-04 NOTE — ED Notes (Signed)
Pt c/o panic attack today; pt sts hx of same; pt sts is [redacted] weeks pregnant with no complaints

## 2013-01-04 NOTE — ED Notes (Signed)
No answer x2 

## 2013-01-04 NOTE — ED Notes (Signed)
No answer x1

## 2013-01-07 ENCOUNTER — Encounter (HOSPITAL_COMMUNITY): Payer: Self-pay | Admitting: *Deleted

## 2013-01-07 ENCOUNTER — Inpatient Hospital Stay (HOSPITAL_COMMUNITY)
Admission: AD | Admit: 2013-01-07 | Discharge: 2013-01-07 | Disposition: A | Discharge status: Home / Self Care | Payer: Medicaid Other | Source: Ambulatory Visit | Attending: Obstetrics & Gynecology | Admitting: Obstetrics & Gynecology

## 2013-01-07 DIAGNOSIS — K5900 Constipation, unspecified: Secondary | ICD-10-CM

## 2013-01-07 DIAGNOSIS — R109 Unspecified abdominal pain: Secondary | ICD-10-CM | POA: Insufficient documentation

## 2013-01-07 DIAGNOSIS — O99891 Other specified diseases and conditions complicating pregnancy: Secondary | ICD-10-CM | POA: Insufficient documentation

## 2013-01-07 LAB — URINALYSIS, ROUTINE W REFLEX MICROSCOPIC
Bilirubin Urine: NEGATIVE
Glucose, UA: NEGATIVE mg/dL
Hgb urine dipstick: NEGATIVE
Ketones, ur: NEGATIVE mg/dL
Nitrite: NEGATIVE
Protein, ur: 30 mg/dL — AB
Specific Gravity, Urine: 1.02 (ref 1.005–1.030)
Urobilinogen, UA: 0.2 mg/dL (ref 0.0–1.0)
pH: 7.5 (ref 5.0–8.0)

## 2013-01-07 LAB — URINE MICROSCOPIC-ADD ON

## 2013-01-07 MED ORDER — POLYETHYLENE GLYCOL 3350 17 GM/SCOOP PO POWD: 17.0000 g | Freq: Every day | ORAL | Status: DC

## 2013-01-07 MED ORDER — PROMETHAZINE HCL 25 MG PO TABS: 25.0000 mg | ORAL_TABLET | Freq: Four times a day (QID) | ORAL | Status: DC | PRN

## 2013-01-07 NOTE — MAU Provider Note (Signed)
Attestation of Attending Supervision of Advanced Practitioner (CNM/NP): Evaluation and management procedures were performed by the Advanced Practitioner under my supervision and collaboration.  I have reviewed the Advanced Practitioner's note and chart, and I agree with the management and plan.  CONSTANT,PEGGY 01/07/2013 9:19 AM   

## 2013-01-07 NOTE — MAU Note (Signed)
Pt G2 P1 at 6.5wks having generalized abd pain x 1wk.  Pt LBM 5/16, unable to go to the bathroom.  Pt seen in MAU last wk, tx for trichomonas.

## 2013-01-07 NOTE — MAU Provider Note (Signed)
History     CSN: [id number]  Arrival date and time: 01/07/13 2039   First Provider Initiated Contact with Patient 01/07/13 2149      Chief Complaint  Patient presents with  . Abdominal Pain  . Constipation   HPI Ms. Phyllis Dalton is a 22 y.o. G2P1001 at [redacted]w[redacted]d who presents to MAU today with complaint of constipation and diffuse abdominal pain. The patient states pain since last visit on 01/02/13. She has not had BM since then either. She also states difficulty with urination and frequent N/V. She rates her pain at 9/10 now. She has not taken any medications for pain or nausea. She denies fever, but states occasional chills. IUP confirmed at last visit. Denies vaginal bleeding, discharge.  OB History   Grav Para Term Preterm Abortions TAB SAB Ect Mult Living   2 1 1  0 0 0 0 0 0 1      Past Medical History  Diagnosis Date  . Panic attack     Past Surgical History  Procedure Laterality Date  . Mouth surgery      Family History  Problem Relation Age of Onset  . Diabetes Other   . Diabetes Mother   . HIV Mother     History  Substance Use Topics  . Smoking status: Former Smoker    Types: Cigarettes    Quit date: 12/25/2012  . Smokeless tobacco: Not on file  . Alcohol Use: No     Comment: rare    Allergies: No Known Allergies  No prescriptions prior to admission    Review of Systems  Constitutional: Positive for chills. Negative for fever and malaise/fatigue.  Gastrointestinal: Positive for nausea, vomiting, abdominal pain and constipation. Negative for diarrhea.  Genitourinary: Negative for dysuria, urgency and frequency.       Neg - vaginal bleeding, discharge   Physical Exam   Blood pressure 110/68, pulse 91, temperature 98.3 F (36.8 C), temperature source Oral, resp. rate 16, height 5\' 10"  (1.778 m), weight 235 lb (106.595 kg), last menstrual period 11/21/2012.  Physical Exam  Constitutional: She is oriented to person, place, and time. She appears  well-developed and well-nourished. No distress.  Patient has difficulty sitting still  HENT:  Head: Normocephalic and atraumatic.  Cardiovascular: Normal rate, regular rhythm and normal heart sounds.   Respiratory: Effort normal and breath sounds normal. No respiratory distress.  GI: Soft. Bowel sounds are normal. She exhibits no distension and no mass. There is tenderness (moderate diffuse tenderness to palpation). There is no rebound and no guarding.  Neurological: She is alert and oriented to person, place, and time.  Skin: Skin is warm and dry. No erythema.  Psychiatric: She has a normal mood and affect.   Results for orders placed during the hospital encounter of 01/07/13 (from the past 24 hour(s))  URINALYSIS, ROUTINE W REFLEX MICROSCOPIC     Status: Abnormal   Collection Time    01/07/13  9:30 PM      Result Value Range   Color, Urine YELLOW  YELLOW   APPearance CLEAR  CLEAR   Specific Gravity, Urine 1.020  1.005 - 1.030   pH 7.5  5.0 - 8.0   Glucose, UA NEGATIVE  NEGATIVE mg/dL   Hgb urine dipstick NEGATIVE  NEGATIVE   Bilirubin Urine NEGATIVE  NEGATIVE   Ketones, ur NEGATIVE  NEGATIVE mg/dL   Protein, ur 30 (*) NEGATIVE mg/dL   Urobilinogen, UA 0.2  0.0 - 1.0 mg/dL   Nitrite NEGATIVE  NEGATIVE   Leukocytes, UA TRACE (*) NEGATIVE  URINE MICROSCOPIC-ADD ON     Status: Abnormal   Collection Time    01/07/13  9:30 PM      Result Value Range   Squamous Epithelial / LPF MANY (*) RARE   WBC, UA 3-6  <3 WBC/hpf   Bacteria, UA FEW (*) RARE    MAU Course  Procedures None  MDM Patient offered Fleet enema vs Miralax at home. Patient chose Miralax. Patient instructed to take dose hourly until BM. If no BM tonight may return tomorrow for enema.   Assessment and Plan  A: Constipation  P: Discharge home Rx for Miralax and Phenergan given to patient Patient advised on Miralax regimen for constipation Patient advised to increase PO hydration as tolerated Patient encouraged  to keep appointment to start prenatal care with Dr. Gaynell Face as scheduled Patient may return to MAU as needed or if her condition were to change or worsen  Freddi Starr, PA-C  01/07/2013, 10:14 PM

## 2013-01-08 LAB — URINE CULTURE: Colony Count: >=100000

## 2013-01-11 ENCOUNTER — Inpatient Hospital Stay (HOSPITAL_COMMUNITY)
Admission: AD | Admit: 2013-01-11 | Discharge: 2013-01-12 | Disposition: A | Discharge status: Home / Self Care | Payer: Medicaid Other | Source: Ambulatory Visit | Attending: Obstetrics and Gynecology | Admitting: Obstetrics and Gynecology

## 2013-01-11 ENCOUNTER — Inpatient Hospital Stay (HOSPITAL_COMMUNITY): Payer: Medicaid Other

## 2013-01-11 ENCOUNTER — Encounter (HOSPITAL_COMMUNITY): Payer: Self-pay

## 2013-01-11 DIAGNOSIS — O26899 Other specified pregnancy related conditions, unspecified trimester: Secondary | ICD-10-CM

## 2013-01-11 DIAGNOSIS — O3680X1 Pregnancy with inconclusive fetal viability, fetus 1: Secondary | ICD-10-CM

## 2013-01-11 DIAGNOSIS — R51 Headache: Secondary | ICD-10-CM | POA: Insufficient documentation

## 2013-01-11 DIAGNOSIS — O219 Vomiting of pregnancy, unspecified: Secondary | ICD-10-CM

## 2013-01-11 DIAGNOSIS — R109 Unspecified abdominal pain: Secondary | ICD-10-CM | POA: Insufficient documentation

## 2013-01-11 DIAGNOSIS — O210 Mild hyperemesis gravidarum: Secondary | ICD-10-CM | POA: Insufficient documentation

## 2013-01-11 DIAGNOSIS — K5900 Constipation, unspecified: Secondary | ICD-10-CM | POA: Insufficient documentation

## 2013-01-11 DIAGNOSIS — R1084 Generalized abdominal pain: Secondary | ICD-10-CM

## 2013-01-11 DIAGNOSIS — O99891 Other specified diseases and conditions complicating pregnancy: Secondary | ICD-10-CM

## 2013-01-11 DIAGNOSIS — O99611 Diseases of the digestive system complicating pregnancy, first trimester: Secondary | ICD-10-CM

## 2013-01-11 MED ORDER — METOCLOPRAMIDE HCL 10 MG PO TABS: 10.0000 mg | ORAL_TABLET | Freq: Three times a day (TID) | ORAL | Status: DC

## 2013-01-11 MED ORDER — DOCUSATE SODIUM 100 MG PO CAPS: 100.0000 mg | ORAL_CAPSULE | Freq: Two times a day (BID) | ORAL | Status: DC | PRN

## 2013-01-11 MED ORDER — METOCLOPRAMIDE HCL 10 MG PO TABS
10.0000 mg | ORAL_TABLET | Freq: Once | ORAL | Status: AC
  Administered 2013-01-11: 10 mg via ORAL
  Filled 2013-01-11: qty 1

## 2013-01-11 NOTE — MAU Note (Signed)
Have not had BM in about a week. Here few days ago and started on powder. Told if did not help to return for enema. Having abdominal cramping

## 2013-01-11 NOTE — MAU Provider Note (Signed)
Chief Complaint: Constipation, Emesis During Pregnancy and Headache  First Provider Initiated Contact with Patient 01/11/13 2225     SUBJECTIVE HPI: Phyllis Dalton is a 22 y.o. G2P1001 at [redacted]w[redacted]d by LMP who presents with no BM in 2 weeks and generalized abdominal cramping. Seen in MAU x2 for similar symptoms. Started on MiraLAX. No relief of symptoms. Instructed to return to MAU for enema as needed if MiraLAX did not work. No improvement in nausea with Phenergan.   Past Medical History  Diagnosis Date  . Panic attack    OB History   Grav Para Term Preterm Abortions TAB SAB Ect Mult Living   2 1 1  0 0 0 0 0 0 1     # Outc Date GA Lbr Len/2nd Wgt Sex Del Anes PTL Lv   1 TRM      SVD   Yes   2 CUR              Past Surgical History  Procedure Laterality Date  . Mouth surgery     History   Social History  . Marital Status: Single    Spouse Name: N/A    Number of Children: N/A  . Years of Education: N/A   Occupational History  . Not on file.   Social History Main Topics  . Smoking status: Former Smoker    Types: Cigarettes    Quit date: 12/25/2012  . Smokeless tobacco: Not on file  . Alcohol Use: No     Comment: rare  . Drug Use: No  . Sexually Active: Yes   Other Topics Concern  . Not on file   Social History Narrative  . No narrative on file   No current facility-administered medications on file prior to encounter.   Current Outpatient Prescriptions on File Prior to Encounter  Medication Sig Dispense Refill  . polyethylene glycol powder (GLYCOLAX/MIRALAX) powder Take 17 g by mouth daily.  255 g  0  . promethazine (PHENERGAN) 25 MG tablet Take 1 tablet (25 mg total) by mouth every 6 (six) hours as needed for nausea.  30 tablet  0   No Known Allergies  ROS: Positive for nausea, abdominal pain. Negative for fever, chills, vaginal discharge, vaginal bleeding, vomiting, urinary complaints.  OBJECTIVE Blood pressure 114/79, pulse 80, temperature 98.1 F (36.7  C), temperature source Oral, resp. rate 20, height 5\' 5"  (1.651 m), weight 102.422 kg (225 lb 12.8 oz), last menstrual period 11/21/2012, SpO2 100.00%. GENERAL: Well-developed, well-nourished female in mild distress.  HEENT: Normocephalic HEART: normal rate RESP: normal effort ABDOMEN: Distended, mild tenderness. Hyperactive bowel sounds. EXTREMITIES: Nontender, no edema NEURO: Alert and oriented SPECULUM EXAM: Deferred  LAB RESULTS No results found for this or any previous visit (from the past 24 hour(s)).  IMAGING [place] FHR 132, 7.2 week SIUP.   MAU COURSE Excellent results w/ soap suds enema. Abd pain resolved. Pt states she is feeling much better. Nausea resolved with Reglan. ASSESSMENT 1. Constipation in pregnancy in first trimester   2. Pregnancy with inconclusive fetal viability, fetus 1   3. Pregnancy with generalized abdominal pain, antepartum   4. Nausea and vomiting in pregnancy prior to [redacted] weeks gestation    PLAN Discharge home in stable condition. Increase fluids and fiber. Follow-up Information   Please follow up. (Start prenatal care)       Follow up with THE [facility]  MATERNITY ADMISSIONS. (As needed for emergencies)    Contact information:   [address]  [address] [id number] [place] [address] [phone]        Medication List    TAKE these medications       docusate sodium 100 MG capsule  Commonly known as:  COLACE  Take 1 capsule (100 mg total) by mouth 2 (two) times daily as needed for constipation.     metoCLOPramide 10 MG tablet  Commonly known as:  REGLAN  Take 1 tablet (10 mg total) by mouth 3 (three) times daily with meals.     polyethylene glycol powder powder  Commonly known as:  GLYCOLAX/MIRALAX  Take 17 g by mouth daily.     promethazine 25 MG tablet  Commonly known as:  PHENERGAN  Take 1 tablet (25 mg total) by mouth every 6 (six) hours as needed for nausea.       [place], [place] 01/11/2013   10:23 PM

## 2013-01-13 NOTE — MAU Provider Note (Signed)
Attestation of Attending Supervision of Advanced Practitioner (CNM/NP): Evaluation and management procedures were performed by the Advanced Practitioner under my supervision and collaboration.  I have reviewed the Advanced Practitioner's note and chart, and I agree with the management and plan.  CONSTANT,PEGGY 01/13/2013 8:21 AM   

## 2013-01-15 NOTE — MAU Provider Note (Signed)
Attestation of Attending Supervision of Advanced Practitioner (CNM/NP): Evaluation and management procedures were performed by the Advanced Practitioner under my supervision and collaboration. I have reviewed the Advanced Practitioner's note and chart, and I agree with the management and plan.  LEGGETT,KELLY H. 1:48 PM

## 2013-01-21 ENCOUNTER — Encounter (HOSPITAL_COMMUNITY): Payer: Self-pay | Admitting: *Deleted

## 2013-01-21 ENCOUNTER — Inpatient Hospital Stay (HOSPITAL_COMMUNITY)
Admission: AD | Admit: 2013-01-21 | Discharge: 2013-01-21 | Disposition: A | Discharge status: Home / Self Care | Payer: Medicaid Other | Source: Ambulatory Visit | Attending: Obstetrics and Gynecology | Admitting: Obstetrics and Gynecology

## 2013-01-21 DIAGNOSIS — A749 Chlamydial infection, unspecified: Secondary | ICD-10-CM

## 2013-01-21 DIAGNOSIS — R109 Unspecified abdominal pain: Secondary | ICD-10-CM | POA: Insufficient documentation

## 2013-01-21 DIAGNOSIS — N739 Female pelvic inflammatory disease, unspecified: Secondary | ICD-10-CM | POA: Insufficient documentation

## 2013-01-21 DIAGNOSIS — A5619 Other chlamydial genitourinary infection: Secondary | ICD-10-CM | POA: Insufficient documentation

## 2013-01-21 DIAGNOSIS — O26899 Other specified pregnancy related conditions, unspecified trimester: Secondary | ICD-10-CM

## 2013-01-21 HISTORY — DX: Obesity, unspecified: E66.9

## 2013-01-21 LAB — URINALYSIS, ROUTINE W REFLEX MICROSCOPIC
Glucose, UA: NEGATIVE mg/dL
Hgb urine dipstick: NEGATIVE
Ketones, ur: NEGATIVE mg/dL
Leukocytes, UA: NEGATIVE
Nitrite: NEGATIVE
Protein, ur: 30 mg/dL — AB
Specific Gravity, Urine: >1.03 — ABNORMAL HIGH (ref 1.005–1.030)
Urobilinogen, UA: 0.2 mg/dL (ref 0.0–1.0)
pH: 6 (ref 5.0–8.0)

## 2013-01-21 LAB — URINE MICROSCOPIC-ADD ON

## 2013-01-21 MED ORDER — AZITHROMYCIN 1 G PO PACK
1.0000 g | PACK | Freq: Once | ORAL | Status: AC
  Administered 2013-01-21: 1 g via ORAL
  Filled 2013-01-21: qty 1

## 2013-01-21 MED ORDER — ONDANSETRON 4 MG PO TBDP
4.0000 mg | ORAL_TABLET | Freq: Once | ORAL | Status: AC
  Administered 2013-01-21: 4 mg via ORAL
  Filled 2013-01-21: qty 1

## 2013-01-21 MED ORDER — IBUPROFEN 800 MG PO TABS
800.0000 mg | ORAL_TABLET | Freq: Once | ORAL | Status: AC
  Administered 2013-01-21: 800 mg via ORAL
  Filled 2013-01-21: qty 1

## 2013-01-21 MED ORDER — ONDANSETRON 8 MG PO TBDP: 8.0000 mg | ORAL_TABLET | Freq: Three times a day (TID) | ORAL | Status: DC | PRN

## 2013-01-21 NOTE — Progress Notes (Signed)
S:  Pt is feeling better.  Able to keep down po Zofran, Azithromycin, and Motrin per off-going CNM orders.  Pt reports s.o. Will seek treatment at health department.  Pt has had no vomiting in MAU.  She reports lower abdominal pain comes and goes, but better than on arrival.  No VB.  She reports having 2 previous u/s this pregnancy, and viability has been confirmed.   O:  .Marland Kitchen Filed Vitals:   01/21/13 1715 01/21/13 2020  BP: 99/56 126/70  Pulse: 97 98  Temp: 98.7 F (37.1 C)   TempSrc: Oral   Resp: 16 16  .Marland Kitchen Results for orders placed during the hospital encounter of 01/21/13 (from the past 24 hour(s))  URINALYSIS, ROUTINE W REFLEX MICROSCOPIC     Status: Abnormal   Collection Time    01/21/13  4:30 PM      Result Value Range   Color, Urine YELLOW  YELLOW   APPearance HAZY (*) CLEAR   Specific Gravity, Urine >1.030 (*) 1.005 - 1.030   pH 6.0  5.0 - 8.0   Glucose, UA NEGATIVE  NEGATIVE mg/dL   Hgb urine dipstick NEGATIVE  NEGATIVE   Bilirubin Urine SMALL (*) NEGATIVE   Ketones, ur NEGATIVE  NEGATIVE mg/dL   Protein, ur 30 (*) NEGATIVE mg/dL   Urobilinogen, UA 0.2  0.0 - 1.0 mg/dL   Nitrite NEGATIVE  NEGATIVE   Leukocytes, UA NEGATIVE  NEGATIVE  URINE MICROSCOPIC-ADD ON     Status: Abnormal   Collection Time    01/21/13  4:30 PM      Result Value Range   Squamous Epithelial / LPF RARE  RARE   WBC, UA 0-2  <3 WBC/hpf   Casts HYALINE CASTS (*) NEGATIVE   Urine-Other MICROSCOPIC EXAM PERFORMED ON UNCONCENTRATED URINE    Gen:  Pt sitting in bs chair on my arrival to room; NAD.  A: 1. [redacted]w[redacted]d     2. Abdominal pain improved     3. Treatment for pos chlamydia cx from 5/14, today     4. N/v of pregnancy  P:  D/c'd home, diet disc'd at length; given zofran Rx; has Phenergan at home and Reglan.  Pt to f/u 02/01/13 as scheduled, or prn worsening s/s or concerns.  Instructed on pelvic rest until NV when TOC will be done for CT.  C. Denny Levy, CNM

## 2013-01-21 NOTE — MAU Provider Note (Signed)
History   22 yo, G2P1001 at [redacted]w[redacted]d presented unannounced with left sided lower abdominal pain which radiates up to epigastric area.  The pain is constant for the last three weeks and worse when she eats.  Emesis 3x in past 24 hours.  Dx'd with Chlamydia 01/02/13 was not treated.  Denies VB, UCs, recent fever, resp or GI c/o's, UTI or PIH s/s.    No chief complaint on file.   OB History   Grav Para Term Preterm Abortions TAB SAB Ect Mult Living   2 1 1  0 0 0 0 0 0 1      Past Medical History  Diagnosis Date  . Panic attack   . Obesity     Past Surgical History  Procedure Laterality Date  . Mouth surgery      Family History  Problem Relation Age of Onset  . Diabetes Other   . Diabetes Mother   . HIV Mother     History  Substance Use Topics  . Smoking status: Former Smoker    Types: Cigarettes    Quit date: 12/25/2012  . Smokeless tobacco: Not on file  . Alcohol Use: No     Comment: rare    Allergies: No Known Allergies  Prescriptions prior to admission  Medication Sig Dispense Refill  . docusate sodium (COLACE) 100 MG capsule Take 1 capsule (100 mg total) by mouth 2 (two) times daily as needed for constipation.  30 capsule  2  . metoCLOPramide (REGLAN) 10 MG tablet Take 1 tablet (10 mg total) by mouth 3 (three) times daily with meals.  90 tablet  2  . polyethylene glycol powder (GLYCOLAX/MIRALAX) powder Take 17 g by mouth daily.  255 g  0  . promethazine (PHENERGAN) 25 MG tablet Take 1 tablet (25 mg total) by mouth every 6 (six) hours as needed for nausea.  30 tablet  0    ROS: see HPI above, all other systems are negative   Physical Exam   Blood pressure 99/56, pulse 97, temperature 98.7 F (37.1 C), temperature source Oral, resp. rate 16, last menstrual period 11/21/2012.  Chest: Clear Heart: RRR Abdomen: gravid, NT Extremities: WNL  Results for orders placed during the hospital encounter of 01/21/13 (from the past 48 hour(s))  URINALYSIS, ROUTINE W REFLEX  MICROSCOPIC     Status: Abnormal   Collection Time    01/21/13  4:30 PM      Result Value Range   Color, Urine YELLOW  YELLOW   APPearance HAZY (*) CLEAR   Specific Gravity, Urine >1.030 (*) 1.005 - 1.030   pH 6.0  5.0 - 8.0   Glucose, UA NEGATIVE  NEGATIVE mg/dL   Hgb urine dipstick NEGATIVE  NEGATIVE   Bilirubin Urine SMALL (*) NEGATIVE   Ketones, ur NEGATIVE  NEGATIVE mg/dL   Protein, ur 30 (*) NEGATIVE mg/dL   Urobilinogen, UA 0.2  0.0 - 1.0 mg/dL   Nitrite NEGATIVE  NEGATIVE   Leukocytes, UA NEGATIVE  NEGATIVE  URINE MICROSCOPIC-ADD ON     Status: Abnormal   Collection Time    01/21/13  4:30 PM      Result Value Range   Squamous Epithelial / LPF RARE  RARE   WBC, UA 0-2  <3 WBC/hpf   Casts HYALINE CASTS (*) NEGATIVE   Urine-Other MICROSCOPIC EXAM PERFORMED ON UNCONCENTRATED URINE     Comment: MUCOUS PRESENT    ED Course  IUP at [redacted]w[redacted]d Abdominal pain Chlamydia pos in 12/2012 without treatment  Zofran 4 mg ODT once Motrin 800 mg once Azithromycin 1 g PO once  Reported off to [facility] for discharge    Haroldine Laws CNM, MSN 01/21/2013 5:23 PM

## 2013-01-21 NOTE — MAU Provider Note (Signed)
Was diagnosed with chlamydia in May and not treated.  Having lower abd pain for the past three weeks. Has vomited 3x in past 24 hrs.

## 2013-01-22 LAB — OB RESULTS CONSOLE HIV ANTIBODY (ROUTINE TESTING): HIV: NONREACTIVE

## 2013-01-22 LAB — OB RESULTS CONSOLE ABO/RH: RH Type: POSITIVE

## 2013-01-22 LAB — OB RESULTS CONSOLE RPR: RPR: NONREACTIVE

## 2013-01-22 LAB — OB RESULTS CONSOLE HEPATITIS B SURFACE ANTIGEN: Hepatitis B Surface Ag: NEGATIVE

## 2013-01-22 LAB — OB RESULTS CONSOLE RUBELLA ANTIBODY, IGM: Rubella: IMMUNE

## 2013-01-22 LAB — OB RESULTS CONSOLE ANTIBODY SCREEN: Antibody Screen: NEGATIVE

## 2013-02-08 ENCOUNTER — Inpatient Hospital Stay (HOSPITAL_COMMUNITY)
Admission: AD | Admit: 2013-02-08 | Discharge: 2013-02-08 | Disposition: A | Discharge status: Home / Self Care | Payer: Medicaid Other | Source: Ambulatory Visit | Attending: Obstetrics and Gynecology | Admitting: Obstetrics and Gynecology

## 2013-02-08 ENCOUNTER — Encounter (HOSPITAL_COMMUNITY): Payer: Self-pay | Admitting: *Deleted

## 2013-02-08 DIAGNOSIS — R51 Headache: Secondary | ICD-10-CM | POA: Insufficient documentation

## 2013-02-08 DIAGNOSIS — K219 Gastro-esophageal reflux disease without esophagitis: Secondary | ICD-10-CM

## 2013-02-08 DIAGNOSIS — O210 Mild hyperemesis gravidarum: Secondary | ICD-10-CM | POA: Insufficient documentation

## 2013-02-08 DIAGNOSIS — O219 Vomiting of pregnancy, unspecified: Secondary | ICD-10-CM

## 2013-02-08 LAB — URINALYSIS, ROUTINE W REFLEX MICROSCOPIC
Bilirubin Urine: NEGATIVE
Glucose, UA: NEGATIVE mg/dL
Hgb urine dipstick: NEGATIVE
Ketones, ur: 15 mg/dL — AB
Leukocytes, UA: NEGATIVE
Nitrite: NEGATIVE
Protein, ur: NEGATIVE mg/dL
Specific Gravity, Urine: 1.015 (ref 1.005–1.030)
Urobilinogen, UA: 0.2 mg/dL (ref 0.0–1.0)
pH: 8 (ref 5.0–8.0)

## 2013-02-08 LAB — CBC
HCT: 35.8 % — ABNORMAL LOW (ref 36.0–46.0)
Hemoglobin: 12 g/dL (ref 12.0–15.0)
MCH: 26.6 pg (ref 26.0–34.0)
MCHC: 33.5 g/dL (ref 30.0–36.0)
MCV: 79.4 fL (ref 78.0–100.0)
Platelets: 421 10*3/uL — ABNORMAL HIGH (ref 150–400)
RBC: 4.51 MIL/uL (ref 3.87–5.11)
RDW: 13.3 % (ref 11.5–15.5)
WBC: 11.1 10*3/uL — ABNORMAL HIGH (ref 4.0–10.5)

## 2013-02-08 LAB — COMPREHENSIVE METABOLIC PANEL
ALT: 55 U/L — ABNORMAL HIGH (ref 0–35)
AST: 31 U/L (ref 0–37)
Albumin: 3.3 g/dL — ABNORMAL LOW (ref 3.5–5.2)
Alkaline Phosphatase: 96 U/L (ref 39–117)
BUN: 6 mg/dL (ref 6–23)
CO2: 28 mEq/L (ref 19–32)
Calcium: 9.8 mg/dL (ref 8.4–10.5)
Chloride: 98 mEq/L (ref 96–112)
Creatinine, Ser: 0.62 mg/dL (ref 0.50–1.10)
GFR calc Af Amer: >90 mL/min (ref >90)
GFR calc non Af Amer: >90 mL/min (ref >90)
Glucose, Bld: 93 mg/dL (ref 70–99)
Potassium: 3.8 mEq/L (ref 3.5–5.1)
Sodium: 136 mEq/L (ref 135–145)
Total Bilirubin: 0.3 mg/dL (ref 0.3–1.2)
Total Protein: 6.5 g/dL (ref 6.0–8.3)

## 2013-02-08 MED ORDER — SODIUM CHLORIDE 0.9 % IJ SOLN
INTRAMUSCULAR | Status: AC
  Administered 2013-02-08: 3 mL
  Filled 2013-02-08: qty 3

## 2013-02-08 MED ORDER — LACTATED RINGERS IV SOLN
INTRAVENOUS | Status: DC
  Administered 2013-02-08: 17:00:00 via INTRAVENOUS

## 2013-02-08 MED ORDER — ONDANSETRON HCL 4 MG/2ML IJ SOLN
4.0000 mg | Freq: Once | INTRAMUSCULAR | Status: AC
  Administered 2013-02-08: 4 mg via INTRAVENOUS
  Filled 2013-02-08: qty 2

## 2013-02-08 MED ORDER — PANTOPRAZOLE SODIUM 40 MG PO TBEC: 40.0000 mg | DELAYED_RELEASE_TABLET | Freq: Every day | ORAL | Status: DC

## 2013-02-08 MED ORDER — LACTATED RINGERS IV BOLUS (SEPSIS)
1000.0000 mL | Freq: Once | INTRAVENOUS | Status: AC
  Administered 2013-02-08: 1000 mL via INTRAVENOUS

## 2013-02-08 MED ORDER — ACETAMINOPHEN 500 MG PO TABS
500.0000 mg | ORAL_TABLET | Freq: Once | ORAL | Status: AC
  Administered 2013-02-08: 500 mg via ORAL
  Filled 2013-02-08: qty 1

## 2013-02-08 MED ORDER — FAMOTIDINE 20 MG PO TABS
20.0000 mg | ORAL_TABLET | Freq: Once | ORAL | Status: AC
  Administered 2013-02-08: 20 mg via ORAL
  Filled 2013-02-08: qty 1

## 2013-02-08 NOTE — MAU Provider Note (Signed)
History   22yo, G2P1001 at [redacted]w[redacted]d arrived via EMS. C/o N/V and HA x 2 days. Reports "spiting up blood" today, was described as dark pieces in her emesis then she spit out bright red. Also c/o chest pressure that worsens with emesis episodes, she says it "feels like there is something there that needs to come up" Denies VB, UCs, LOF, recent fever, resp c/o's, UTI or PIH s/s.  12 lead by EMS - NSR    Recently tx'd for Trichomonas 02/01/13   Chief Complaint  Patient presents with  . nausea, vomiting     OB History   Grav Para Term Preterm Abortions TAB SAB Ect Mult Living   2 1 1  0 0 0 0 0 0 1      Past Medical History  Diagnosis Date  . Panic attack   . Obesity     Past Surgical History  Procedure Laterality Date  . Mouth surgery      Family History  Problem Relation Age of Onset  . Diabetes Other   . Diabetes Mother   . HIV Mother     History  Substance Use Topics  . Smoking status: Former Smoker    Types: Cigarettes    Quit date: 12/25/2012  . Smokeless tobacco: Not on file  . Alcohol Use: No     Comment: rare    Allergies: No Known Allergies  Prescriptions prior to admission  Medication Sig Dispense Refill  . docusate sodium (COLACE) 100 MG capsule Take 1 capsule (100 mg total) by mouth 2 (two) times daily as needed for constipation.  30 capsule  2  . metoCLOPramide (REGLAN) 10 MG tablet Take 1 tablet (10 mg total) by mouth 3 (three) times daily with meals.  90 tablet  2  . ondansetron (ZOFRAN-ODT) 8 MG disintegrating tablet Take 8 mg by mouth every 8 (eight) hours as needed for nausea.      . polyethylene glycol powder (GLYCOLAX/MIRALAX) powder Take 17 g by mouth daily.  255 g  0  . promethazine (PHENERGAN) 25 MG tablet Take 1 tablet (25 mg total) by mouth every 6 (six) hours as needed for nausea.  30 tablet  0  . [DISCONTINUED] ondansetron (ZOFRAN ODT) 8 MG disintegrating tablet Take 1 tablet (8 mg total) by mouth every 8 (eight) hours as needed for nausea.   20 tablet  1    ROS: see HPI above, all other systems are negative   Physical Exam   Blood pressure 101/53, pulse 84, temperature 98.2 F (36.8 C), temperature source Oral, resp. rate 20, height 5\' 5"  (1.651 m), weight 227 lb (102.967 kg), last menstrual period 11/21/2012, SpO2 98.00%.  Chest: Clear Heart: RRR Abdomen: gravid, NT Extremities: WNL   ED Course  IUP at [redacted]w[redacted]d Severe N/V for 2 days Possible heartburn  IV LR bolus Zofran 4mg  IV Once Tylenol 500 mg Once for HA  Protonix 40mg  tab Once PO tolerance test  Pt tolerating crackers and juice well, stating she is "feeling better" No emesis since admission Pt asleep in MAU room  D/c home with rx Protonix 40 mg QD F/u at already scheduled ROB and U/S on 02/21/13   Haroldine Laws CNM, MSN 02/08/2013 3:44 PM

## 2013-02-08 NOTE — MAU Note (Signed)
[redacted] week Pregnant Patient arrived via EMS. C/O nausea, vomiting, and headache for 2 days. Patient states "spit up blood" today. Also c/o of chest pressure that worsens with emesis episodes; EMS reported completion of 12 lead which was SR.

## 2013-02-08 NOTE — MAU Note (Signed)
Patient states was given nausea medication at a past visit but has been unable to keep it down.

## 2013-02-27 ENCOUNTER — Inpatient Hospital Stay (HOSPITAL_COMMUNITY)
Admission: AD | Admit: 2013-02-27 | Discharge: 2013-02-27 | Disposition: A | Discharge status: Home / Self Care | Payer: Medicaid Other | Source: Ambulatory Visit | Attending: Obstetrics and Gynecology | Admitting: Obstetrics and Gynecology

## 2013-02-27 ENCOUNTER — Inpatient Hospital Stay (HOSPITAL_COMMUNITY): Payer: Medicaid Other

## 2013-02-27 ENCOUNTER — Encounter (HOSPITAL_COMMUNITY): Payer: Self-pay | Admitting: *Deleted

## 2013-02-27 DIAGNOSIS — O99891 Other specified diseases and conditions complicating pregnancy: Secondary | ICD-10-CM | POA: Insufficient documentation

## 2013-02-27 DIAGNOSIS — Y92009 Unspecified place in unspecified non-institutional (private) residence as the place of occurrence of the external cause: Secondary | ICD-10-CM | POA: Insufficient documentation

## 2013-02-27 DIAGNOSIS — W010XXA Fall on same level from slipping, tripping and stumbling without subsequent striking against object, initial encounter: Secondary | ICD-10-CM | POA: Insufficient documentation

## 2013-02-27 DIAGNOSIS — R109 Unspecified abdominal pain: Secondary | ICD-10-CM | POA: Insufficient documentation

## 2013-02-27 DIAGNOSIS — W19XXXA Unspecified fall, initial encounter: Secondary | ICD-10-CM

## 2013-02-27 MED ORDER — HYDROCODONE-ACETAMINOPHEN 5-325 MG PO TABS: 1.0000 | ORAL_TABLET | Freq: Four times a day (QID) | ORAL | Status: DC | PRN

## 2013-02-27 NOTE — Progress Notes (Signed)
Pt states she is having pain on  Her "whole right side where I fell"

## 2013-02-27 NOTE — MAU Provider Note (Signed)
History   22yo G2P1001 at [redacted]w[redacted]d presents today d/t a fall at home on a puddle of water on her stairs.  The fall occurred on her stairs and involved the right side of her abdomen.  Denies VB, UCs, LOF, recent fever, resp or GI c/o's, UTI or PIH s/s.  Chief Complaint  Patient presents with  . Fall    OB History   Grav Para Term Preterm Abortions TAB SAB Ect Mult Living   2 1 1  0 0 0 0 0 0 1      Past Medical History  Diagnosis Date  . Panic attack   . Obesity     Past Surgical History  Procedure Laterality Date  . Mouth surgery      Family History  Problem Relation Age of Onset  . Diabetes Other   . Diabetes Mother   . HIV Mother     History  Substance Use Topics  . Smoking status: Former Smoker    Types: Cigarettes    Quit date: 12/25/2012  . Smokeless tobacco: Not on file  . Alcohol Use: No     Comment: rare    Allergies: No Known Allergies  Prescriptions prior to admission  Medication Sig Dispense Refill  . metoCLOPramide (REGLAN) 10 MG tablet Take 1 tablet (10 mg total) by mouth 3 (three) times daily with meals.  90 tablet  2    ROS: see HPI above, all other systems are negative   Physical Exam   Blood pressure 104/72, temperature 98.5 F (36.9 C), temperature source Oral, resp. rate 16, height 5\' 6"  (1.676 m), weight 228 lb (103.42 kg), last menstrual period 11/21/2012.  Chest: Clear Heart: RRR Abdomen: gravid, tender on right side Extremities: WNL  FHT: 154  ED Course  IUP at [redacted]w[redacted]d Fall with abdominal involvement  U/S - SIUP, cardiac activity 146 bpm, No evidence of subchorionic hemorrhage or free fluid  D/C home with precautions F/u at [facility] on 03/21/13 or call if symptoms worsens   Haroldine Laws CNM, MSN 02/27/2013 4:12 PM

## 2013-02-27 NOTE — Progress Notes (Signed)
Pt fell today at home

## 2013-02-27 NOTE — MAU Note (Signed)
Pt states she fell down this morning and" I had an appointment at 1400, I called my Ob - Gyn  andI was told to come here" pt  States"I fell on her back on to her side and my butt."

## 2013-03-21 ENCOUNTER — Inpatient Hospital Stay (HOSPITAL_COMMUNITY)
Admission: EM | Admit: 2013-03-21 | Discharge: 2013-03-25 | DRG: 885 | Disposition: A | Discharge status: Home / Self Care | Payer: Medicaid Other | Attending: Psychiatry | Admitting: Psychiatry

## 2013-03-21 ENCOUNTER — Observation Stay (HOSPITAL_COMMUNITY)
Admission: EM | Admit: 2013-03-21 | Discharge: 2013-03-21 | Disposition: A | Discharge status: Other Acute Inpatient Hospital | Payer: Medicaid Other | Attending: Emergency Medicine | Admitting: Emergency Medicine

## 2013-03-21 ENCOUNTER — Encounter (HOSPITAL_COMMUNITY): Payer: Self-pay | Admitting: Emergency Medicine

## 2013-03-21 DIAGNOSIS — R45851 Suicidal ideations: Secondary | ICD-10-CM | POA: Insufficient documentation

## 2013-03-21 DIAGNOSIS — F322 Major depressive disorder, single episode, severe without psychotic features: Secondary | ICD-10-CM | POA: Diagnosis present

## 2013-03-21 DIAGNOSIS — F410 Panic disorder [episodic paroxysmal anxiety] without agoraphobia: Secondary | ICD-10-CM

## 2013-03-21 DIAGNOSIS — Z560 Unemployment, unspecified: Secondary | ICD-10-CM | POA: Insufficient documentation

## 2013-03-21 DIAGNOSIS — F32A Depression, unspecified: Secondary | ICD-10-CM

## 2013-03-21 DIAGNOSIS — F329 Major depressive disorder, single episode, unspecified: Secondary | ICD-10-CM | POA: Insufficient documentation

## 2013-03-21 DIAGNOSIS — Z3490 Encounter for supervision of normal pregnancy, unspecified, unspecified trimester: Secondary | ICD-10-CM

## 2013-03-21 DIAGNOSIS — O99340 Other mental disorders complicating pregnancy, unspecified trimester: Principal | ICD-10-CM | POA: Insufficient documentation

## 2013-03-21 LAB — CBC WITH DIFFERENTIAL/PLATELET
Basophils Absolute: 0 10*3/uL (ref 0.0–0.1)
Basophils Relative: 0 % (ref 0–1)
Eosinophils Absolute: 0.1 10*3/uL (ref 0.0–0.7)
Eosinophils Relative: 1 % (ref 0–5)
HCT: 38.2 % (ref 36.0–46.0)
Hemoglobin: 12.5 g/dL (ref 12.0–15.0)
Lymphocytes Relative: 20 % (ref 12–46)
Lymphs Abs: 1.7 10*3/uL (ref 0.7–4.0)
MCH: 26.5 pg (ref 26.0–34.0)
MCHC: 32.7 g/dL (ref 30.0–36.0)
MCV: 80.9 fL (ref 78.0–100.0)
Monocytes Absolute: 0.5 10*3/uL (ref 0.1–1.0)
Monocytes Relative: 6 % (ref 3–12)
Neutro Abs: 6.3 10*3/uL (ref 1.7–7.7)
Neutrophils Relative %: 73 % (ref 43–77)
Platelets: 460 10*3/uL — ABNORMAL HIGH (ref 150–400)
RBC: 4.72 MIL/uL (ref 3.87–5.11)
RDW: 13.6 % (ref 11.5–15.5)
WBC: 8.6 10*3/uL (ref 4.0–10.5)

## 2013-03-21 LAB — URINE MICROSCOPIC-ADD ON

## 2013-03-21 LAB — URINALYSIS, ROUTINE W REFLEX MICROSCOPIC
Bilirubin Urine: NEGATIVE
Glucose, UA: NEGATIVE mg/dL
Hgb urine dipstick: NEGATIVE
Ketones, ur: NEGATIVE mg/dL
Nitrite: NEGATIVE
Protein, ur: NEGATIVE mg/dL
Specific Gravity, Urine: 1.031 — ABNORMAL HIGH (ref 1.005–1.030)
Urobilinogen, UA: 1 mg/dL (ref 0.0–1.0)
pH: 8 (ref 5.0–8.0)

## 2013-03-21 LAB — BASIC METABOLIC PANEL
BUN: 5 mg/dL — ABNORMAL LOW (ref 6–23)
CO2: 27 mEq/L (ref 19–32)
Calcium: 9.3 mg/dL (ref 8.4–10.5)
Chloride: 100 mEq/L (ref 96–112)
Creatinine, Ser: 0.65 mg/dL (ref 0.50–1.10)
GFR calc Af Amer: >90 mL/min (ref >90)
GFR calc non Af Amer: >90 mL/min (ref >90)
Glucose, Bld: 87 mg/dL (ref 70–99)
Potassium: 3.7 mEq/L (ref 3.5–5.1)
Sodium: 135 mEq/L (ref 135–145)

## 2013-03-21 LAB — POCT PREGNANCY, URINE: Preg Test, Ur: POSITIVE — AB

## 2013-03-21 MED ORDER — ONDANSETRON 4 MG PO TBDP
8.0000 mg | ORAL_TABLET | Freq: Four times a day (QID) | ORAL | Status: DC | PRN
  Administered 2013-03-22 – 2013-03-25 (×6): 8 mg via ORAL
  Filled 2013-03-21: qty 2
  Filled 2013-03-21: qty 1

## 2013-03-21 MED ORDER — ONDANSETRON HCL 4 MG PO TABS
4.0000 mg | ORAL_TABLET | Freq: Three times a day (TID) | ORAL | Status: DC | PRN
  Administered 2013-03-21: 4 mg via ORAL
  Filled 2013-03-21: qty 1

## 2013-03-21 MED ORDER — ACETAMINOPHEN 325 MG PO TABS: 650.0000 mg | ORAL_TABLET | ORAL | Status: DC | PRN

## 2013-03-21 NOTE — ED Notes (Signed)
Up to the bathroom vomiting.  Pt reports she has been vomiting daily since the day she found she was pregnant and normally takes zofran daily-which whe did not have today
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
## 2013-03-21 NOTE — Consult Note (Signed)
Reason for Consult: Suicidal  Referring Physician: Dr. Gena Fray is an 22 y.o. female.  HPI: Patient is [redacted] weeks pregnant and has suicidal thoughts with a plan to wreck her car or cut herself.  She has no support for the baby, unemployed, and lives alone.  Ms. Brugger denies auditory/visual hallucinations, no current medications, no drug or alcohol use.  No prior hospitalizations for psych and no prior suicide attempts.  Past Medical History  Diagnosis Date  . Panic attack   . Obesity     Past Surgical History  Procedure Laterality Date  . Mouth surgery      Family History  Problem Relation Age of Onset  . Diabetes Other   . Diabetes Mother   . HIV Mother     Social History:  reports that she quit smoking about 2 months ago. Her smoking use included Cigarettes. She smoked 0.00 packs per day. She does not have any smokeless tobacco history on file. She reports that she does not drink alcohol or use illicit drugs.  Allergies: No Known Allergies  Medications: I have reviewed the patient's current medications.  Results for orders placed during the hospital encounter of 03/21/13 (from the past 48 hour(s))  URINALYSIS, ROUTINE W REFLEX MICROSCOPIC     Status: Abnormal   Collection Time    03/21/13 11:38 AM      Result Value Range   Color, Urine YELLOW  YELLOW   APPearance TURBID (*) CLEAR   Specific Gravity, Urine 1.031 (*) 1.005 - 1.030   pH 8.0  5.0 - 8.0   Glucose, UA NEGATIVE  NEGATIVE mg/dL   Hgb urine dipstick NEGATIVE  NEGATIVE   Bilirubin Urine NEGATIVE  NEGATIVE   Ketones, ur NEGATIVE  NEGATIVE mg/dL   Protein, ur NEGATIVE  NEGATIVE mg/dL   Urobilinogen, UA 1.0  0.0 - 1.0 mg/dL   Nitrite NEGATIVE  NEGATIVE   Leukocytes, UA SMALL (*) NEGATIVE  URINE MICROSCOPIC-ADD ON     Status: Abnormal   Collection Time    03/21/13 11:38 AM      Result Value Range   Squamous Epithelial / LPF MANY (*) RARE   WBC, UA 3-6  <3 WBC/hpf   Bacteria, UA FEW (*) RARE    Urine-Other MUCOUS PRESENT    POCT PREGNANCY, URINE     Status: Abnormal   Collection Time    03/21/13 11:45 AM      Result Value Range   Preg Test, Ur POSITIVE (*) NEGATIVE   Comment:            THE SENSITIVITY OF THIS     METHODOLOGY IS >24 mIU/mL  CBC WITH DIFFERENTIAL     Status: Abnormal   Collection Time    03/21/13 11:50 AM      Result Value Range   WBC 8.6  4.0 - 10.5 K/uL   RBC 4.72  3.87 - 5.11 MIL/uL   Hemoglobin 12.5  12.0 - 15.0 g/dL   HCT 16.1  09.6 - 04.5 %   MCV 80.9  78.0 - 100.0 fL   MCH 26.5  26.0 - 34.0 pg   MCHC 32.7  30.0 - 36.0 g/dL   RDW 40.9  81.1 - 91.4 %   Platelets 460 (*) 150 - 400 K/uL   Neutrophils Relative % 73  43 - 77 %   Neutro Abs 6.3  1.7 - 7.7 K/uL   Lymphocytes Relative 20  12 - 46 %   Lymphs  Abs 1.7  0.7 - 4.0 K/uL   Monocytes Relative 6  3 - 12 %   Monocytes Absolute 0.5  0.1 - 1.0 K/uL   Eosinophils Relative 1  0 - 5 %   Eosinophils Absolute 0.1  0.0 - 0.7 K/uL   Basophils Relative 0  0 - 1 %   Basophils Absolute 0.0  0.0 - 0.1 K/uL  BASIC METABOLIC PANEL     Status: Abnormal   Collection Time    03/21/13 11:50 AM      Result Value Range   Sodium 135  135 - 145 mEq/L   Potassium 3.7  3.5 - 5.1 mEq/L   Chloride 100  96 - 112 mEq/L   CO2 27  19 - 32 mEq/L   Glucose, Bld 87  70 - 99 mg/dL   BUN 5 (*) 6 - 23 mg/dL   Creatinine, Ser 1.61  0.50 - 1.10 mg/dL   Calcium 9.3  8.4 - 09.6 mg/dL   GFR calc non Af Amer >90  >90 mL/min   GFR calc Af Amer >90  >90 mL/min   Comment:            The eGFR has been calculated     using the CKD EPI equation.     This calculation has not been     validated in all clinical     situations.     eGFR's persistently     <90 mL/min signify     possible Chronic Kidney Disease.    No results found.  Review of Systems  Constitutional: Negative.   HENT: Negative.   Eyes: Negative.   Respiratory: Negative.   Cardiovascular: Negative.   Gastrointestinal: Negative.   Genitourinary: Negative.    Musculoskeletal: Negative.   Skin: Negative.   Neurological: Negative.   Endo/Heme/Allergies: Negative.   Psychiatric/Behavioral: Positive for depression and suicidal ideas.   Blood pressure 91/60, pulse 89, temperature 98 F (36.7 C), temperature source Oral, resp. rate 20, last menstrual period 11/21/2012, SpO2 100.00%. Physical Exam Completed by primary MD, reviewed  Family History:  No family history on file.   Mental Status Examination/Evaluation: Patient is dishelved, eye contact fair, depressed mood with congruent effect. She is calm and cooperative today, suicidal ideations to wreck her car or cut herself,denies homicidal thoughts.  Her thoughts are organized and goal directed.  There is no paranoia or delusions present at this time. She denies any auditory or visual hallucination. Her attention and concentration are fair.  Her insight and judgment are fair to poor.  DIAGNOSIS:  AXIS I  Major depressive disorder  AXIS II  Deferred   AXIS III  Pregnant, 17 weeks  AXIS IV  other psychosocial or environmental problems, unemployed, problems with primary support group   AXIS V  35 severe symptoms    Assessment/Plan:  Recommend inpatient psychiatric treatment  Nanine Means, PMH-NP 03/21/2013, 4:04 PM

## 2013-03-21 NOTE — ED Notes (Signed)
Per pt, feels depressed because she as though she she can not take care of unborn child-has no support system-having thought of harming self

## 2013-03-21 NOTE — ED Notes (Signed)
Report called to [facility] RN-transport in approx 30 mins

## 2013-03-21 NOTE — ED Notes (Signed)
Called the Adult Unit at [facility] and spoke to [facility] they just started an admission and if writer waits a few minutes then send pt.

## 2013-03-21 NOTE — ED Notes (Signed)
Sipping coke(helps w/ the nausea per pt), zofran given

## 2013-03-21 NOTE — ED Notes (Signed)
Pt was just in the bathroom vomiting she's requesting more nausea medicine. But just asked for peanut butter and graham crackers which was given to pt.

## 2013-03-21 NOTE — ED Notes (Signed)
Pt transported to [facility] via security and tech.  

## 2013-03-21 NOTE — ED Notes (Signed)
Up to the desk on the phone 

## 2013-03-21 NOTE — ED Notes (Signed)
Do not transport until after 1900 per Tanna Savoy

## 2013-03-21 NOTE — ED Notes (Signed)
Patient has one bag of belongings in locker 29. 

## 2013-03-21 NOTE — ED Notes (Signed)
Up to the bathroom 

## 2013-03-21 NOTE — ED Notes (Signed)
Pt eatting supper, grandmother into see

## 2013-03-21 NOTE — ED Notes (Signed)
Pt reports that she didn't want another baby and the father wants nothing to do with her or baby. She questions her ability to financially care for the baby although her 23 yr old son lives with her mom and has for the past year simply because he never wants to come home she allows him to stay there with her mom. She said she took care of him when he was a baby through 25 yrs old. She reports no psychiatric history at all. She just has been depressed about being pregnant again and it's getting worse. She admits to telling her OB today that she was suicidal with a plan to wreck her car or slit her wrists. She still feels this way but as you talk to her she says she's lost weight rather than gained and she smokes weed to help with the nausea and vomiting and to give herself an appetite because she doesn't have one most of the time. While the smoking weed isn't good she voices concern for the baby she's carrying. She doesn't believe in abortion and said she'll probably give baby up for adoption to someone in her family and if they won't take baby she'll go outside of the family and give baby up for adoption. Suggested she read up on possible effects of smoking weed while pregnant and what it can do to the baby. Suggested she look into resources available to her to help her take care of herself and her baby as well. She denies any other drug use or ETOH use. She denies HI/AVH.

## 2013-03-21 NOTE — ED Notes (Signed)
Patient has on bag of belongings in locker 29.

## 2013-03-21 NOTE — ED Provider Notes (Signed)
CSN: [id number]     Arrival date & time 03/21/13  1110 History     First MD Initiated Contact with Patient 03/21/13 1124     Chief Complaint  Patient presents with  . Medical Clearance    HPI Pt states she is depressed and having thoughts of hurting herself. Currently [redacted] weeks pregnant.  Has thought of "wrecking my car or cutting myself".. No actions.  States depression is over current pregnancy.  G2P1 1 living child. 22y/o, lives c pts mother.  Prenatal care includes tx for chlamydia, prior u/s Normal after fall in June. Past Medical History  Diagnosis Date  . Panic attack   . Obesity    Past Surgical History  Procedure Laterality Date  . Mouth surgery     Family History  Problem Relation Age of Onset  . Diabetes Other   . Diabetes Mother   . HIV Mother    History  Substance Use Topics  . Smoking status: Former Smoker    Types: Cigarettes    Quit date: 12/25/2012  . Smokeless tobacco: Not on file  . Alcohol Use: No     Comment: rare   OB History   Grav Para Term Preterm Abortions TAB SAB Ect Mult Living   2 1 1  0 0 0 0 0 0 1     Review of Systems  Constitutional: Negative.   HENT: Negative.   Eyes: Negative.   Respiratory: Negative.   Cardiovascular: Negative.   Gastrointestinal: Positive for nausea.  Endocrine: Negative.   Genitourinary: Negative.  Negative for vaginal bleeding and pelvic pain.  Allergic/Immunologic: Negative.   Neurological: Negative.   Hematological: Negative.   Psychiatric/Behavioral: Positive for suicidal ideas.    Allergies  Review of patient's allergies indicates no known allergies.  Home Medications   Current Outpatient Rx  Name  Route  Sig  Dispense  Refill  . ondansetron (ZOFRAN) 8 MG tablet   Oral   Take 8 mg by mouth every 8 (eight) hours as needed for nausea.          BP 113/65  Pulse 83  Temp(Src) 99.3 F (37.4 C) (Oral)  Resp 16  SpO2 100%  LMP 11/21/2012 Physical Exam  Constitutional: She appears  well-nourished.  HENT:  Head: Normocephalic.  Eyes: Pupils are equal, round, and reactive to light.  Neck: Normal range of motion.  Cardiovascular: Exam reveals no S3 and no S4.   Pulmonary/Chest: She has no decreased breath sounds. She has no wheezes. She has no rhonchi.  Abdominal: Soft. Normal appearance. There is tenderness.  Neurological: She is alert. She has normal strength. She is disoriented. A sensory deficit is present.  Psychiatric: Judgment and thought content normal. Her mood appears anxious. Her affect is not blunt, not labile and not inappropriate. Her speech is not rapid and/or pressured. She is slowed. She exhibits a depressed mood.    ED Course   Procedures (including critical care time)  Labs Reviewed  URINALYSIS, ROUTINE W REFLEX MICROSCOPIC - Abnormal; Notable for the following:    APPearance TURBID (*)    Specific Gravity, Urine 1.031 (*)    Leukocytes, UA SMALL (*)    All other components within normal limits  CBC WITH DIFFERENTIAL - Abnormal; Notable for the following:    Platelets 460 (*)    All other components within normal limits  BASIC METABOLIC PANEL - Abnormal; Notable for the following:    BUN 5 (*)    All other components within normal  limits  URINE MICROSCOPIC-ADD ON - Abnormal; Notable for the following:    Squamous Epithelial / LPF MANY (*)    Bacteria, UA FEW (*)    All other components within normal limits  POCT PREGNANCY, URINE - Abnormal; Notable for the following:    Preg Test, Ur POSITIVE (*)    All other components within normal limits  URINE CULTURE   No results found. 1. Depression   2. Pregnant     MDM  Depression. No medical or pregnancy issues.  Await Act team evaluation. Medically clear, with Urine Culture pending.  Claudean Kinds, MD 03/21/13 [id number]

## 2013-03-21 NOTE — BHH Counselor (Signed)
Patient accepted to [facility] by Jamison Lord,NP. The attending physician is Dr. Elsie Saas. The room assignment 504-1. The nursing report # is [phone].

## 2013-03-22 ENCOUNTER — Encounter (HOSPITAL_COMMUNITY): Payer: Self-pay

## 2013-03-22 DIAGNOSIS — F410 Panic disorder [episodic paroxysmal anxiety] without agoraphobia: Secondary | ICD-10-CM

## 2013-03-22 DIAGNOSIS — F332 Major depressive disorder, recurrent severe without psychotic features: Secondary | ICD-10-CM

## 2013-03-22 LAB — URINE CULTURE: Colony Count: 75000

## 2013-03-22 MED ORDER — ACETAMINOPHEN 500 MG PO TABS
1000.0000 mg | ORAL_TABLET | Freq: Four times a day (QID) | ORAL | Status: DC | PRN
  Administered 2013-03-22 – 2013-03-23 (×2): 1000 mg via ORAL
  Filled 2013-03-22 (×2): qty 2

## 2013-03-22 NOTE — BHH Group Notes (Signed)
Adult Psychoeducational Group Note  Date:  03/22/2013 Time:  10:07 PM  Group Topic/Focus:  Wrap-Up Group:   The focus of this group is to help patients review their daily goal of treatment and discuss progress on daily workbooks.  Participation Level:  Did Not Attend  Participation Quality:  None  Affect:  None  Cognitive:  None  Insight: None  Engagement in Group:  None  Modes of Intervention:  Discussion  Additional Comments:  Adeliz did not attend group.  Caroll Rancher A 03/22/2013, 10:07 PM
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
## 2013-03-22 NOTE — Tx Team (Signed)
Interdisciplinary Treatment Plan Update (Adult)  Date: 03/22/2013  Time Reviewed:  9:45 AM  Progress in Treatment: Attending groups: Yes Participating in groups:  Yes Taking medication as prescribed:  Yes Tolerating medication:  Yes Family/Significant othe contact made: CSW assessing  Patient understands diagnosis:  Yes Discussing patient identified problems/goals with staff:  Yes Medical problems stabilized or resolved:  Yes Denies suicidal/homicidal ideation: Yes Issues/concerns per patient self-inventory:  Yes Other:  New problem(s) identified: N/A  Discharge Plan or Barriers: CSW assessing for appropriate referrals.  Reason for Continuation of Hospitalization: Anxiety Depression Medication Stabilization  Comments: N/A  Estimated length of stay: 3-5 days  For review of initial/current patient goals, please see plan of care.  Attendees: Patient:     Family:     Physician:  Dr. Johnalagadda 03/22/2013 11:02 AM   Nursing:   Sheila Lilly, RN 03/22/2013 11:02 AM   Clinical Social Worker:  Chelsea [name], LCSWA 03/22/2013 11:02 AM   Other: Cynthia Goble, RN 03/22/2013 11:02 AM   Other:  Maseta Dorley, MA care coordination 03/22/2013 11:02 AM   Other:  Jennifer Clark, case manager 03/22/2013 11:02 AM   Other:  Roneica Byrd, RN 03/22/2013 11:02 AM  Other:    Other:    Other:    Other:    Other:    Other:     Scribe for Treatment Team:   [name], Chelsea Nicole, 03/22/2013 11:02 AM    

## 2013-03-22 NOTE — BHH Group Notes (Signed)
BHH LCSW Aftercare Discharge Planning Group Note   03/22/2013 8:45 AM  Participation Quality:  Did Not Attend   Chelsea [name], LCSWA 03/22/2013 9:39 AM     

## 2013-03-22 NOTE — Progress Notes (Signed)
D:  Phyllis Dalton denies any SI/HI/AVH at this time.  She reports that she slept poorly and has been in bed most of the day.  She is rating depression and hopelessness at 8/10.  She has not been attending groups, reporting nausea and back pain.  She reports off and on thoughts of hurting herself, but she contracts for safety. A:  Safety checks q 15 minutes.  Emotional support provided.  Medications administered as ordered. R: Safety maintained on unit.

## 2013-03-22 NOTE — Consult Note (Signed)
Note reviewed and agreed with  

## 2013-03-22 NOTE — Progress Notes (Signed)
Patient ID: Phyllis Dalton, female   DOB: Apr 18, 1991, 21 y.o.   MRN: [id number] Patient asleep; no s/s of distress noted at this time. Pt's respirations regular and unlabored.
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
## 2013-03-22 NOTE — Progress Notes (Signed)
Patient ID: Phyllis Dalton, female   DOB: 21-Apr-1991, 22 y.o.   MRN: [id number]  D: Patient pleasant and cooperative on assessment flat affect but brightens on approach. No distress noted. A: Q 15 minute safety checks, encourage staff/peer interaction, administer medications as ordered by MD. R: Pt declines to go to group session stating she is tired. Pt verbally contracts for safety.

## 2013-03-22 NOTE — BHH Suicide Risk Assessment (Signed)
Suicide Risk Assessment  Admission Assessment     Nursing information obtained from:    Demographic factors:    Current Mental Status:    Loss Factors:    Historical Factors:    Risk Reduction Factors:     CLINICAL FACTORS:   Severe Anxiety and/or Agitation Panic Attacks Depression:   Anhedonia Hopelessness Impulsivity Insomnia Recent sense of peace/wellbeing Severe Unstable or Poor Therapeutic Relationship  COGNITIVE FEATURES THAT CONTRIBUTE TO RISK:  Closed-mindedness Polarized thinking    SUICIDE RISK:   Mild:  Suicidal ideation of limited frequency, intensity, duration, and specificity.  There are no identifiable plans, no associated intent, mild dysphoria and related symptoms, good self-control (both objective and subjective assessment), few other risk factors, and identifiable protective factors, including available and accessible social support.  PLAN OF CARE: Admit voluntarily, emergently to [facility] for depression and suicidal ideation. Patient has been pregnant with unwanted baby and a gestation of 17 weeks.  I certify that inpatient services furnished can reasonably be expected to improve the patient's condition.    JONNALAGADDA,JANARDHAHA R. 03/22/2013, 11:46 AM

## 2013-03-22 NOTE — BHH Suicide Risk Assessment (Signed)
[facility] Adult Inpatient Family/Significant Other Suicide Prevention Education  Suicide Prevention Education:   Patient Refusal for Family/Significant Other Suicide Prevention Education: The patient has refused to provide written consent for family/significant other to be provided Family/Significant Other Suicide Prevention Education during admission and/or prior to discharge.  Physician notified.  CSW provided suicide prevention information with patient.    The suicide prevention education provided includes the following:  Suicide risk factors  Suicide prevention and interventions  National Suicide Hotline telephone number  [facility] assessment telephone number  [facility] Emergency Assistance 911  [facility] and/or Residential Mobile Crisis Unit telephone number   Reyes Ivan, [place] 03/22/2013 2:05 PM

## 2013-03-22 NOTE — Progress Notes (Signed)
Adult Psychoeducational Group Note  Date:  03/22/2013 Time:  6:36 PM  Group Topic/Focus:  Relapse Prevention Planning:   The focus of this group is to define relapse and discuss the need for planning to combat relapse.  Participation Level:  Did Not Attend   Phyllis Dalton 03/22/2013, 6:36 PM

## 2013-03-22 NOTE — [facility] Group Notes (Signed)
[facility] LCSW Group Therapy  03/22/2013  1:15 PM   Type of Therapy:  Group Therapy  Participation Level:  Minimal  Participation Quality:  Inattentive  Affect:  Appropriate, Depressed and Flat  Cognitive:  Alert and Appropriate  Insight:  Limited, Lacking  Engagement in Therapy:  Limited, Lacking  Modes of Intervention:  Clarification, Confrontation, Discussion, Education, Exploration, Limit-setting, Orientation, Problem-solving, Rapport Building, Dance movement psychotherapist, Socialization and Support  Summary of Progress/Problems: The topic for today was feelings about relapse.  Pt discussed what relapse prevention is to them and identified triggers that they are on the path to relapse.  Pt processed their feeling towards relapse and was able to relate to peers.  Pt discussed coping skills that can be used for relapse prevention.   Pt did not participate int he group discussion but sat through group and appeared to be listening to group discussion.    Reyes Ivan, LCSWA 03/22/2013 3:07 PM

## 2013-03-22 NOTE — Progress Notes (Signed)
Pt is a 22 yr old female who is currently [redacted] weeks pregnant. Pt admitted for SI with a plan to cut her wrist or wreck her car. Pt stressors is having no support system for her current pregnancy. Pt is also endorsing anxiety and depression. Pt also reports that she has been isolating herself from others. She is denying any AVH. She came in with nausea and vomiting. 1 count of emesis during the admission. Therefore some of the admission remains incomplete . See required admission documents on front of chart for more missing sections. At this time, the on call Doctor has only ordered Zofran for this pt's use. This is her second pregnancy. Pt is currently living alone. She denies any PMH.

## 2013-03-22 NOTE — Tx Team (Signed)
Initial Interdisciplinary Treatment Plan  PATIENT STRENGTHS: (choose at least two) Ability for insight Average or above average intelligence General fund of knowledge Motivation for treatment/growth  PATIENT STRESSORS: pregnancy with no support   PROBLEM LIST: Problem List/Patient Goals Date to be addressed Date deferred Reason deferred Estimated date of resolution  Increased risk for SI 7/31   D/c  Depression      Anxiety                                           DISCHARGE CRITERIA:  Improved stabilization in mood, thinking, and/or behavior Medical problems require only outpatient monitoring Need for constant or close observation no longer present Reduction of life-threatening or endangering symptoms to within safe limits  PRELIMINARY DISCHARGE PLAN: Outpatient therapy  PATIENT/FAMIILY INVOLVEMENT: This treatment plan has been presented to and reviewed with the patient, Phyllis Dalton.  The patient and family have been given the opportunity to ask questions and make suggestions.  INGRAM, CHARITY A 03/22/2013, 7:22 AM

## 2013-03-23 DIAGNOSIS — F411 Generalized anxiety disorder: Secondary | ICD-10-CM

## 2013-03-23 DIAGNOSIS — F329 Major depressive disorder, single episode, unspecified: Secondary | ICD-10-CM

## 2013-03-23 MED ORDER — LORATADINE 10 MG PO TABS
10.0000 mg | ORAL_TABLET | Freq: Every day | ORAL | Status: DC
  Administered 2013-03-23: 10 mg via ORAL
  Filled 2013-03-23 (×6): qty 1

## 2013-03-23 NOTE — Progress Notes (Signed)
Psychoeducational Group Note  Date: 03/23/2013 Time:  1015  Group Topic/Focus:  Identifying Needs:   The focus of this group is to help patients identify their personal needs that have been historically problematic and identify healthy behaviors to address their needs.  Participation Level:  Active  Participation Quality:  Appropriate  Affect:  Appropriate  Cognitive:  Appropriate  Insight:  Improving  Engagement in Group:  Engaged  Additional Comments:    Judge, Christine A 

## 2013-03-23 NOTE — Progress Notes (Signed)
[facility] MD Progress Note  03/23/2013 2:01 PM Phyllis Dalton  MRN:  [id number]  Subjective:  Patient has poor sleep, rated 1/10 depression, little anxiety, denies suicidal ideations.  She stated the groups have really helped her, "got  It all out", smiling.  She spoke with her grandmother and encouraging phone call.  Keneye did complain of allergy symptoms, Claritin 10 mg daily ordered after pharmacist consulted.  Sleep poor due to frequent napping yesterday.  Diagnosis:   Axis I: Anxiety Disorder NOS and Major Depression, single episode Axis II: Deferred Axis III:  Past Medical History  Diagnosis Date  . Panic attack   . Obesity    Axis IV: occupational problems, other psychosocial or environmental problems, problems related to social environment and problems with primary support group Axis V: 41-50 serious symptoms  ADL's:  Intact  Sleep: Poor  Appetite:  Fair  Suicidal Ideation:  Denies Homicidal Ideation:  Denies  Psychiatric Specialty Exam: Review of Systems  Constitutional: Negative.   HENT: Positive for congestion.   Eyes: Negative.   Respiratory: Negative.   Cardiovascular: Negative.   Gastrointestinal: Negative.   Genitourinary: Negative.   Musculoskeletal: Negative.   Skin: Negative.   Neurological: Negative.   Endo/Heme/Allergies: Negative.   Psychiatric/Behavioral: Positive for depression. The patient is nervous/anxious.     Blood pressure 130/74, pulse 108, temperature 98.5 F (36.9 C), temperature source Oral, resp. rate 17, height 5\' 3"  (1.6 m), weight 100.245 kg (221 lb), last menstrual period 11/21/2012.Body mass index is 39.16 kg/(m^2).  General Appearance: Casual  Eye Contact::  Fair  Speech:  Normal Rate  Volume:  Normal  Mood:  Anxious and Depressed  Affect:  Congruent  Thought Process:  Coherent  Orientation:  Full (Time, Place, and Person)  Thought Content:  WDL  Suicidal Thoughts:  No  Homicidal Thoughts:  No  Memory:  Immediate;    Fair Recent;   Fair Remote;   Fair  Judgement:  Fair  Insight:  Fair  Psychomotor Activity:  Decreased  Concentration:  Fair  Recall:  Fair  Akathisia:  No  Handed:  Right  AIMS (if indicated):     Assets:  Communication Skills Physical Health Resilience  Sleep:  Number of Hours: 6.5   Current Medications: Current Facility-Administered Medications  Medication Dose Route Frequency Provider Last Rate Last Dose  . acetaminophen (TYLENOL) tablet 1,000 mg  1,000 mg Oral Q6H PRN Phyllis Means, NP   1,000 mg at 03/22/13 1028  . loratadine (CLARITIN) tablet 10 mg  10 mg Oral Daily Phyllis Means, NP      . ondansetron (ZOFRAN-ODT) disintegrating tablet 8 mg  8 mg Oral Q6H PRN Nehemiah Settle, MD   8 mg at 03/22/13 1451    Lab Results: No results found for this or any previous visit (from the past 48 hour(s)).  Physical Findings: AIMS: Facial and Oral Movements Muscles of Facial Expression: None, normal Lips and Perioral Area: None, normal Jaw: None, normal Tongue: None, normal,Extremity Movements Upper (arms, wrists, hands, fingers): None, normal Lower (legs, knees, ankles, toes): None, normal, Trunk Movements Neck, shoulders, hips: None, normal, Overall Severity Severity of abnormal movements (highest score from questions above): None, normal Incapacitation due to abnormal movements: None, normal Patient's awareness of abnormal movements (rate only patient's report): No Awareness, Dental Status Current problems with teeth and/or dentures?: No Does patient usually wear dentures?: No  CIWA:    COWS:     Treatment Plan Summary: Daily contact with patient to assess  and evaluate symptoms and progress in treatment Medication management  Plan:  Review of chart, vital signs, medications, and notes. 1-Individual and group therapy 2-Medication management for depression and anxiety:  Medications reviewed with the patient and Claritin added for her allergies 3-Coping skills for  depression and anxiety 4-Continue crisis stabilization and management 5-Address health issues--monitoring vital signs, stable 6-Treatment plan in progress to prevent relapse of depression and anxiety  Medical Decision Making Problem Points:  Established problem, stable/improving (1) and Review of psycho-social stressors (1) Data Points:  Review of new medications or change in dosage (2)  I certify that inpatient services furnished can reasonably be expected to improve the patient's condition.   Phyllis Dalton, PMH-NP 03/23/2013, 2:01 PM  Reviewed the information documented and agree with the treatment plan.  JONNALAGADDA,JANARDHAHA R. 03/25/2013 12:13 PM

## 2013-03-23 NOTE — Progress Notes (Signed)
Patient ID: Phyllis Dalton, female   DOB: 1991/03/13, 22 y.o.   MRN: [id number] D)  Came to the med room this evening, was tearful and sad, stated she didn't want to talk about it, but indicated she had gotten a phone call and that it wasn't from a family member.  Also stated her back was aching and requested tylenol and heat packs.  Was quiet but appreciative.  Didn't feel like talking about any plans for after discharge.  Went to her room and went to bed. A)  Will continue to monitor for safety, continue POC, support R)  Safety maintained.

## 2013-03-23 NOTE — Progress Notes (Signed)
Patient ID: Phyllis Dalton, female   DOB: 09-Oct-1990, 22 y.o.   MRN: [id number] D)  Has been resting quietly tonight, eyes closed, resp reg, unlabored, no c/o's voiced. A)  Will continue to monitor for safety. R)  Safety maintained.

## 2013-03-23 NOTE — Progress Notes (Signed)
 .  Psychoeducational Group Note    Date: 03/23/2013 Time: 0930   Goal Setting Purpose of Group: To be able to set a goal that is measurable and that can be accomplished in one day Participation Level:  Active  Participation Quality:  Appropriate and Attentive  Affect:  Appropriate  Cognitive:  Appropriate  Insight:  Improving  Engagement in Group:  Improving  Additional Comments:    Judge, Christine A 

## 2013-03-23 NOTE — [facility] Group Notes (Signed)
[facility] Group Notes:  (Clinical Social Work)  03/23/2013   3:00-4:00PM  Summary of Progress/Problems:   The main focus of today's process group was for the patient to identify ways in which they have sabotaged their own mental health wellness/recovery.  Motivational interviewing was used to explore the reasons they engage in this behavior, and reasons they may have for wanting to change.  The Stages of Change were explained to the group using a handout, and patients identified where they are with regard to changing self-defeating behaviors.  The patient expressed that she over analyzes everything, and that steals all her joy away because she will think herself right out of being happy whereas she was happy earlier.  She feels she is in Preparation Stage because she is here getting medications she needs.  She was drowsy during group, stated this was caused by the meds.  Type of Therapy:  Process Group  Participation Level:  Active  Participation Quality:  Drowsy  Affect:  Blunted  Cognitive:  Oriented  Insight:  Improving  Engagement in Therapy:  Improving  Modes of Intervention:  Education, Motivational Interviewing   Ambrose Mantle, LCSW 03/23/2013, 5:44 PM

## 2013-03-23 NOTE — Progress Notes (Signed)
Adult Psychoeducational Group Note  Date:  03/23/2013 Time:  2:30 PM  Group Topic/Focus:  Healthy Communication:   The focus of this group is to discuss communication, barriers to communication, as well as healthy ways to communicate with others.  Participation Level:  Active  Participation Quality:  Appropriate  Affect:  Appropriate  Cognitive:  Appropriate  Insight: Appropriate  Engagement in Group:  Engaged  Modes of Intervention:  Discussion, Socialization and Support  Additional Comments:  Pt expressed still being angry with her child's father and other family members. Pt did identify how she could use "I feel" statements when she leaves the hospital.   Elijio Miles 03/23/2013, 2:30 PM

## 2013-03-23 NOTE — Progress Notes (Signed)
D) Pt. Has attended the groups and interacts with select peers. Rates her depression and hopelessness both at a 5. Denies SI and HI. C/O congestion. No complaints of morning sickness. A) Given support and reassurance. R) Denies SI and HI

## 2013-03-24 NOTE — Progress Notes (Signed)
Patient ID: Phyllis Dalton, female   DOB: [birth year], 22 y.o.   MRN: [id number] [facility] MD Progress Note  03/24/2013 1:46 PM KATALAYA BEEL  MRN:  [id number]  Subjective:  Patient is seen for the weekend rounds. Patient has been participating in unit activities including groups actively. Patient stated she started feeling better and her grandmother is supportive to her. She has stated her depression and anxiety as low today reportedly she slept better last night, 1/10 depression, denies suicidal ideations. Patient does not want medication for the depression because of pregnancy. She continued to have a low back pain mostly due to pregnancy.  Diagnosis:   Axis I: Anxiety Disorder NOS and Major Depression, single episode Axis II: Deferred Axis III:  Past Medical History  Diagnosis Date  . Panic attack   . Obesity    Axis IV: occupational problems, other psychosocial or environmental problems, problems related to social environment and problems with primary support group Axis V: 41-50 serious symptoms  ADL's:  Intact  Sleep: Poor  Appetite:  Fair  Suicidal Ideation:  Denies Homicidal Ideation:  Denies  Psychiatric Specialty Exam: Review of Systems  Constitutional: Negative.   HENT: Positive for congestion.   Eyes: Negative.   Respiratory: Negative.   Cardiovascular: Negative.   Gastrointestinal: Negative.   Genitourinary: Negative.   Musculoskeletal: Negative.   Skin: Negative.   Neurological: Negative.   Endo/Heme/Allergies: Negative.   Psychiatric/Behavioral: Positive for depression. The patient is nervous/anxious.     Blood pressure 99/67, pulse 86, temperature 98.4 F (36.9 C), temperature source Oral, resp. rate 16, height 5\' 3"  (1.6 m), weight 100.245 kg (221 lb), last menstrual period 11/21/2012.Body mass index is 39.16 kg/(m^2).  General Appearance: Casual  Eye Contact::  Fair  Speech:  Normal Rate  Volume:  Normal  Mood:  Anxious and Depressed  Affect:   Congruent  Thought Process:  Coherent  Orientation:  Full (Time, Place, and Person)  Thought Content:  WDL  Suicidal Thoughts:  No  Homicidal Thoughts:  No  Memory:  Immediate;   Fair Recent;   Fair Remote;   Fair  Judgement:  Fair  Insight:  Fair  Psychomotor Activity:  Decreased  Concentration:  Fair  Recall:  Fair  Akathisia:  No  Handed:  Right  AIMS (if indicated):     Assets:  Communication Skills Physical Health Resilience  Sleep:  Number of Hours: 6.5   Current Medications: Current Facility-Administered Medications  Medication Dose Route Frequency Provider Last Rate Last Dose  . acetaminophen (TYLENOL) tablet 1,000 mg  1,000 mg Oral Q6H PRN Nanine Means, NP   1,000 mg at 03/23/13 2059  . loratadine (CLARITIN) tablet 10 mg  10 mg Oral Daily Nanine Means, NP   10 mg at 03/23/13 1434  . ondansetron (ZOFRAN-ODT) disintegrating tablet 8 mg  8 mg Oral Q6H PRN Nehemiah Settle, MD   8 mg at 03/24/13 5621    Lab Results: No results found for this or any previous visit (from the past 48 hour(s)).  Physical Findings: AIMS: Facial and Oral Movements Muscles of Facial Expression: None, normal Lips and Perioral Area: None, normal Jaw: None, normal Tongue: None, normal,Extremity Movements Upper (arms, wrists, hands, fingers): None, normal Lower (legs, knees, ankles, toes): None, normal, Trunk Movements Neck, shoulders, hips: None, normal, Overall Severity Severity of abnormal movements (highest score from questions above): None, normal Incapacitation due to abnormal movements: None, normal Patient's awareness of abnormal movements (rate only patient's report): No Awareness, Dental  Status Current problems with teeth and/or dentures?: No Does patient usually wear dentures?: No  CIWA:    COWS:     Treatment Plan Summary: Daily contact with patient to assess and evaluate symptoms and progress in treatment Medication management  Plan:  Review of chart, vital signs,  medications, and notes. 1-Individual and group therapy 2-Medication management for depression and anxiety:  Medications reviewed with the patient and Claritin added for her allergies 3-Coping skills for depression and anxiety 4-Continue crisis stabilization and management 5-Address health issues--monitoring vital signs, stable 6-Treatment plan in progress to prevent relapse of depression and anxiety  Medical Decision Making Problem Points:  Established problem, stable/improving (1) and Review of psycho-social stressors (1) Data Points:  Review of new medications or change in dosage (2)  I certify that inpatient services furnished can reasonably be expected to improve the patient's condition.   Nehemiah Settle., M.D. 03/24/2013, 1:46 PM

## 2013-03-24 NOTE — Progress Notes (Signed)
D) Pt slept most of the morning and refused her Claritin stating "I don't need it". Stated to this Clinical research associate that she had a lot of trouble sleeping last night and therefore was not going to go to the groups this morning. Did get up for lunch and has stayed up most of the afternoon. Denies SI and HI A) Given support and encouraged to get out of the bed today so she would maintain her sleep cycle. Praise when she did get out of bed to attend the afternoon groups. R) Denies SI and HI.

## 2013-03-24 NOTE — [facility] Group Notes (Signed)
[facility] Group Notes:  (Clinical Social Work)  03/24/2013   3:00-4:00PM  Summary of Progress/Problems:   The main focus of today's process group was to   identify the patient's current support system and decide on other supports that can be put in place.  The picture on workbook was used to discuss why additional supports are needed, and a hand-out was distributed with four definitions/levels of support, then used to talk about how patients have given and received all different kinds of support.  An emphasis was placed on using counselor, doctor, therapy groups, 12-step groups, and problem-specific support groups to expand supports.  The patient identified current support of grandmother, but was in and out of the room several times, did not participate in identifying additional supports possible.  Type of Therapy:  Process Group  Participation Level:  Minimal  Participation Quality:  Inattentive  Affect:  Depressed and Flat  Cognitive:  Oriented  Insight:  Limited  Engagement in Therapy:  Limited  Modes of Intervention:  Education,  Support and [employer], LCSW 03/24/2013, 2:46 PM

## 2013-03-24 NOTE — Progress Notes (Signed)
Psychoeducational Group Note  Date:  03/24/2013 Time:  1015  Group Topic/Focus:  Making Healthy Choices:   The focus of this group is to help patients identify negative/unhealthy choices they were using prior to admission and identify positive/healthier coping strategies to replace them upon discharge.  Participation Level: Did not attend  Judge, Christine A 03/24/2013 

## 2013-03-24 NOTE — Progress Notes (Signed)
Patient ID: Phyllis Dalton, female   DOB: [birth year], 22 y.o.   MRN: [id number] D)  Seems a little brighter this evening, attended group, came to med window and asked for gatorade, said she"throws up ginger ale, and throws up Sprite", so if we don't have gatorade doesn't know what she'll drink.  Able to find 2 cans of gatorade, she laughed and clapped her hands.  Interacting with select peers, no c/o nausea this evening, laughing with peers and watching tv. A)  Will continue to monitor for safety, continue POC R)  Safety maintained.

## 2013-03-25 DIAGNOSIS — F329 Major depressive disorder, single episode, unspecified: Secondary | ICD-10-CM

## 2013-03-25 NOTE — Discharge Summary (Signed)
Physician Discharge Summary Note  Patient:  Phyllis Dalton is an 22 y.o., female MRN:  [id number] DOB:  05-04-1991 Patient phone:  [phone] (home)  Patient address:   [address] [place] [address],   Date of Admission:  03/21/2013 Date of Discharge: 03/25/2013  Reason for Admission:  Suicidal with plan to drive car into tree or to cut herself.  Discharge Diagnoses: Principal Problem:   Major depressive disorder, single episode, severe, without mention of psychotic behavior Active Problems:   Panic attack  Review of Systems  Constitutional: Negative.  Negative for fever, chills, weight loss, malaise/fatigue and diaphoresis.  HENT: Negative for congestion and sore throat.   Eyes: Negative for blurred vision, double vision and photophobia.  Respiratory: Negative for cough, shortness of breath and wheezing.   Cardiovascular: Negative for chest pain, palpitations and PND.  Gastrointestinal: Negative for heartburn, nausea, vomiting, abdominal pain, diarrhea and constipation.  Musculoskeletal: Negative for myalgias, joint pain and falls.  Neurological: Negative for dizziness, tingling, tremors, sensory change, speech change, focal weakness, seizures, loss of consciousness, weakness and headaches.  Endo/Heme/Allergies: Negative for polydipsia. Does not bruise/bleed easily.  Psychiatric/Behavioral: Negative for depression, suicidal ideas, hallucinations, memory loss and substance abuse. The patient is not nervous/anxious and does not have insomnia.    Discharge Diagnoses:  AXIS I: Depressive Disorder NOS  AXIS II: Deferred  AXIS III:  Past Medical History   Diagnosis  Date   .  Panic attack    .  Obesity     AXIS IV: occupational problems, other psychosocial or environmental problems, problems related to social environment and problems with primary support group  AXIS V: 61-70 mild symptoms  Level of Care:  OP  Hospital Course:          Phyllis Dalton presented to  the ED after reporting to her OB that she was suicidal with a plan to wreck her car or to cut her wrists. She reported that she did not want to be pregnant, the baby's father did not want to be involved with her or the baby, and she did not believe in abortion. Phyllis Dalton stated that she had been smoking marijuana daily during her pregnancy to cope with the nausea and stress of the unplanned pregnancy.  Her family is supportive in that her mother takes care of her 62 year old son.  She was given medical clearance and transferred to [facility] for further stabilization and crisis management.       Phyllis Dalton was admitted to the unit and evaluated. The symptoms were identified as depressed mood, anhedonia, fatigue, hopelessness, insomnia, weight loss, excessive worry, and panic.       The patient was oriented to the unit. She was encouraged to participate in unit programming and medication management was not started due to her current pregnancy.       Phyllis Dalton was evaluated each day by a clinical provider to ascertain the patient's response to treatment.  Improvement was noted by the patient's report of decreasing symptoms, improved sleep and appetite, affect, behavior, and participation in unit programming.  The patient was asked each day to complete a self inventory noting mood, mental status, pain, new symptoms, anxiety and concerns.        The patient responded well to being in a therapeutic and supportive environment. Positive and appropriate behavior was noted and the patient was motivated for recovery. The patient worked closely with the treatment team and case manager to develop a discharge plan  with appropriate goals. Coping skills, problem solving as well as relaxation therapies were also part of the unit programming.         By the day of discharge the patient was in much improved condition than upon admission.  Symptoms were reported as significantly decreased or resolved completely.  The patient denied  SI/HI and voiced no AVH. She was motivated to continue taking good care of herself and her baby, with a goal of continued improvement in mental health. She was discharged home with a plan to follow up as noted below.  Consults:  None  Significant Diagnostic Studies:  None  Discharge Vitals:   Blood pressure 95/65, pulse 137, temperature 97.8 F (36.6 C), temperature source Oral, resp. rate 18, height 5\' 3"  (1.6 m), weight 100.245 kg (221 lb), last menstrual period 11/21/2012. Body mass index is 39.16 kg/(m^2). Lab Results:   No results found for this or any previous visit (from the past 72 hour(s)).  Physical Findings: AIMS: Facial and Oral Movements Muscles of Facial Expression: None, normal Lips and Perioral Area: None, normal Jaw: None, normal Tongue: None, normal,Extremity Movements Upper (arms, wrists, hands, fingers): None, normal Lower (legs, knees, ankles, toes): None, normal, Trunk Movements Neck, shoulders, hips: None, normal, Overall Severity Severity of abnormal movements (highest score from questions above): None, normal Incapacitation due to abnormal movements: None, normal Patient's awareness of abnormal movements (rate only patient's report): No Awareness, Dental Status Current problems with teeth and/or dentures?: No Does patient usually wear dentures?: No  CIWA:    COWS:     Psychiatric Specialty Exam: See Psychiatric Specialty Exam and Suicide Risk Assessment completed by Attending Physician prior to discharge.  Discharge destination:  Home  Is patient on multiple antipsychotic therapies at discharge:  No   Has Patient had three or more failed trials of antipsychotic monotherapy by history:  No  Recommended Plan for Multiple Antipsychotic Therapies:  NA   Discharge Orders   Future Orders Complete By Expires     Diet - low sodium heart healthy  As directed     Discharge instructions  As directed     Comments:      Be sure to keep all of your follow up  appointments.  If you are unable to keep your follow up appointment, call your Doctor's office to let them know, and reschedule.  Make sure that you have enough medication to last until your appointment. Be sure to get plenty of rest. Going to bed at the same time each night will help. Try to avoid sleeping during the day.  Increase your activity as tolerated. Regular exercise will help you to sleep better and improve your mental health. Eating a heart healthy diet is recommended. Try to avoid salty or fried foods. Be sure to avoid all alcohol and illegal drugs.    Increase activity slowly  As directed           Keep all of your OB follow up appointments as scheduled.     Medication List    STOP taking these medications       ondansetron 8 MG tablet  Commonly known as:  ZOFRAN           Follow-up Information   Follow up with [facility] On 03/27/2013. (Walk in on this date for hospital discharge appointment.  Walk in clinic is Monday - Friday 8 am - 3 pm. )    Contact information:   [address] [place], [place]  [address] Phone: [phone] Fax: [phone]      Follow-up recommendations:   Activities: Resume activity as tolerated. Diet: Heart healthy low sodium diet Tests: Follow up testing will be determined by your out patient provider.  Comments:    Total Discharge Time:  Greater than 30 minutes.  Signed: Rona Ravens. Mashburn RPAC 1:38 PM 03/25/2013  Patient was seen and evaluated for suicide risk assessment, formulated discharge plans and case discussed with physician extender.Reviewed the information documented and agree with the treatment plan.  JONNALAGADDA,JANARDHAHA R. 03/26/2013 12:46 PM

## 2013-03-25 NOTE — Progress Notes (Signed)
[facility] Group Notes:  (Nursing/MHT/Case Management/Adjunct)  Date:  03/24/2013 Time:  2000  Type of Therapy:  Psychoeducational Skills  Participation Level:  Active  Participation Quality:  Monopolizing  Affect:  Excited  Cognitive:  Appropriate  Insight:  Good  Engagement in Group:  Monopolizing  Modes of Intervention:  Education  Summary of Progress/Problems: The patient shared with the group that she had a good day. She stated that she received more rest today. Her goal for tomorrow is to get discharged and to work on trying to love herself.   Phyllis Dalton 03/25/2013, 12:38 AM

## 2013-03-25 NOTE — [facility] Suicide Risk Assessment (Signed)
Suicide Risk Assessment  Discharge Assessment     Demographic Factors:  Adolescent or young adult, Low socioeconomic status, Unemployed and has [redacted] weeks gestation.  Mental Status Per Nursing Assessment::   On Admission:     Current Mental Status by Physician: Mental Status Examination: Patient appeared as per his stated age, casually dressed, and fairly groomed, and maintaining good eye contact. Patient has good mood and his affect was constricted. She has normal rate, rhythm, and volume of speech. Her thought process is linear and goal directed. Patient has denied suicidal, homicidal ideations, intentions or plans. Patient has no evidence of auditory or visual hallucinations, delusions, and paranoia. Patient has fair insight judgment and impulse control.  Loss Factors: Financial problems/change in socioeconomic status and Limited psychosocial support.  Historical Factors: NA  Risk Reduction Factors:   Sense of responsibility to family, Religious beliefs about death, Living with another person, especially a relative, Positive social support, Positive therapeutic relationship and Positive coping skills or problem solving skills  Continued Clinical Symptoms:  Depression:   Insomnia Recent sense of peace/wellbeing  Cognitive Features That Contribute To Risk:  Closed-mindedness Polarized thinking    Suicide Risk:  Minimal: No identifiable suicidal ideation.  Patients presenting with no risk factors but with morbid ruminations; may be classified as minimal risk based on the severity of the depressive symptoms  Discharge Diagnoses:   AXIS I:  Depressive Disorder NOS AXIS II:  Deferred AXIS III:   Past Medical History  Diagnosis Date  . Panic attack   . Obesity    AXIS IV:  occupational problems, other psychosocial or environmental problems, problems related to social environment and problems with primary support group AXIS V:  61-70 mild symptoms  Plan Of Care/Follow-up  recommendations:  Activity:  As tolerated Diet:  Regular  Is patient on multiple antipsychotic therapies at discharge:  No   Has Patient had three or more failed trials of antipsychotic monotherapy by history:  No  Recommended Plan for Multiple Antipsychotic Therapies: Not applicable  JONNALAGADDA,JANARDHAHA R., and the 03/25/2013, 11:04 AM

## 2013-03-25 NOTE — Tx Team (Signed)
Interdisciplinary Treatment Plan Update (Adult)  Date: 03/25/2013  Time Reviewed:  9:45 AM  Progress in Treatment: Attending groups: Yes Participating in groups:  Yes Taking medication as prescribed:  Yes Tolerating medication:  Yes Family/Significant othe contact made: No, pt refused Patient understands diagnosis:  Yes Discussing patient identified problems/goals with staff:  Yes Medical problems stabilized or resolved:  Yes Denies suicidal/homicidal ideation: Yes Issues/concerns per patient self-inventory:  Yes Other:  New problem(s) identified: N/A  Discharge Plan or Barriers: Pt will follow up at [facility] for medication management and therapy.  Reason for Continuation of Hospitalization: Stable to d/c  Comments: N/A  Estimated length of stay: D/C today  For review of initial/current patient goals, please see plan of care.  Attendees: Patient:  Phyllis Dalton  03/25/2013 11:08 AM  Family:     Physician:  Dr. Javier Glazier 03/25/2013 11:08 AM   Nursing:   Burnetta Sabin, RN 03/25/2013 11:08 AM   Clinical Social Worker:  Reyes Ivan, LCSWA 03/25/2013 11:08 AM   Other: Verne Spurr, PA  03/25/2013 11:08 AM   Other:  Frankey Shown, MA care coordination 03/25/2013 11:08 AM   Other:  Quintella Reichert, RN 03/25/2013 11:08 AM   Other:  Neill Loft, RN 03/25/2013 11:08 AM  Other:    Other:    Other:    Other:    Other:    Other:     Scribe for Treatment Team:   Carmina Miller, 03/25/2013 11:08 AM

## 2013-03-25 NOTE — Progress Notes (Signed)
[facility] Adult Case Management Discharge Plan :  Will you be returning to the same living situation after discharge: Yes,  returning home At discharge, do you have transportation home?:Yes,  access to transportation Do you have the ability to pay for your medications:Yes,  access to meds  Release of information consent forms completed and in the chart;  Patient's signature needed at discharge.  Patient to Follow up at: Follow-up Information   Follow up with [facility] On 03/27/2013. (Walk in on this date for hospital discharge appointment.  Walk in clinic is Monday - Friday 8 am - 3 pm. )    Contact information:   [address], [place] [address] Phone: [phone] Fax: [phone]      Patient denies SI/HI:   Yes,  denies SI/HI    Safety Planning and Suicide Prevention discussed:  Yes,  discussed with pt, pt refused consent to talk with family/friends.  See suicide prevention education note.    Phyllis Dalton 03/25/2013, 11:57 AM

## 2013-03-25 NOTE — Progress Notes (Signed)
Patient ID: Phyllis Dalton, female   DOB: August 27, 1990, 22 y.o.   MRN: [id number] Patient d/c note- Patient cooperative during d/c process. Verbalized ubderstanding of all instructions and f/u.  All belongings returned.  Denies SI/HI. Escorted to lobby to care of family.

## 2013-03-25 NOTE — [facility] Group Notes (Signed)
[facility] LCSW Aftercare Discharge Planning Group Note   03/25/2013 8:45 AM  Participation Quality:  Alert and Appropriate   Mood/Affect:  Appropriate and Calm  Depression Rating:  1  Anxiety Rating:  1  Thoughts of Suicide:  Pt denies SI/HI  Will you contract for safety?   Yes  Current AVH:  Pt denies  Plan for Discharge/Comments:  Pt attended discharge planning group and actively participated in group.  CSW provided pt with today's workbook.  Pt reports feeling stable to d/c today.  Pt will follow up at [facility] for medication management and therapy.  Pt will return home in [place].  No further needs voiced by pt at this time.    Transportation Means: Pt reports access to transportation  Supports: No supports mentioned at this time  Phyllis Dalton, LCSWA 03/25/2013 10:27 AM

## 2013-03-25 NOTE — Progress Notes (Signed)
Adult Psychoeducational Group Note  Date:  03/25/2013 Time:  2:09 PM  Group Topic/Focus:  Wellness Toolbox:   The focus of this group is to discuss various aspects of wellness, balancing those aspects and exploring ways to increase the ability to experience wellness.  Patients will create a wellness toolbox for use upon discharge.  Participation Level:  Active  Participation Quality:  Appropriate  Affect:  Appropriate and Blunted  Cognitive:  Appropriate  Insight: Appropriate  Engagement in Group:  Engaged  Modes of Intervention:  Clarification, Discussion and Exploration  Additional Comments:  Pt attended and actively participated in group with MHT. Pt discussed being discharged today and learning several skills to help with depression and anger.  Lorin Mercy 03/25/2013, 2:09 PM

## 2013-03-28 NOTE — Progress Notes (Signed)
Patient Discharge Instructions:  After Visit Summary (AVS):   Faxed to:  03/28/13 Discharge Summary Note:   Faxed to:  03/28/13 Psychiatric Admission Assessment Note:   Faxed to:  03/28/13 Suicide Risk Assessment - Discharge Assessment:   Faxed to:  03/28/13 Faxed/Sent to the Next Level Care provider:  03/28/13 Faxed to [facility] @ [phone]  Jerelene Redden, 03/28/2013, 4:20 PM

## 2013-04-04 ENCOUNTER — Encounter (HOSPITAL_COMMUNITY): Payer: Self-pay | Admitting: *Deleted

## 2013-04-04 ENCOUNTER — Inpatient Hospital Stay (HOSPITAL_COMMUNITY)
Admission: AD | Admit: 2013-04-04 | Discharge: 2013-04-04 | Disposition: A | Discharge status: Home / Self Care | Payer: Medicaid Other | Source: Ambulatory Visit | Attending: Obstetrics and Gynecology | Admitting: Obstetrics and Gynecology

## 2013-04-04 DIAGNOSIS — O26859 Spotting complicating pregnancy, unspecified trimester: Secondary | ICD-10-CM | POA: Insufficient documentation

## 2013-04-04 DIAGNOSIS — N909 Noninflammatory disorder of vulva and perineum, unspecified: Secondary | ICD-10-CM | POA: Insufficient documentation

## 2013-04-04 LAB — CBC
HCT: 34.5 % — ABNORMAL LOW (ref 36.0–46.0)
Hemoglobin: 11.6 g/dL — ABNORMAL LOW (ref 12.0–15.0)
MCH: 26.9 pg (ref 26.0–34.0)
MCHC: 33.6 g/dL (ref 30.0–36.0)
MCV: 80 fL (ref 78.0–100.0)
Platelets: 408 10*3/uL — ABNORMAL HIGH (ref 150–400)
RBC: 4.31 MIL/uL (ref 3.87–5.11)
RDW: 13.7 % (ref 11.5–15.5)
WBC: 10.8 10*3/uL — ABNORMAL HIGH (ref 4.0–10.5)

## 2013-04-04 LAB — WET PREP, GENITAL: Clue Cells Wet Prep HPF POC: NONE SEEN

## 2013-04-04 MED ORDER — METRONIDAZOLE 500 MG PO TABS: 500.0000 mg | ORAL_TABLET | Freq: Two times a day (BID) | ORAL | Status: AC

## 2013-04-04 MED ORDER — TERCONAZOLE 0.4 % VA CREA: 1.0000 | TOPICAL_CREAM | Freq: Every day | VAGINAL | Status: AC

## 2013-04-04 NOTE — MAU Provider Note (Signed)
History   22yo G2P1001 at [redacted]w[redacted]d presents with vaginal bleeding that appeared on her toilet paper 1 hour ago.  No bleeding since.  Pt reported that she scratched her labia in the shower this morning.  Denies recent IC or vaginal exams.  Denies cramping, UCs, recent fever, resp or GI c/o's, UTI s/s.  Chief Complaint  Patient presents with  . Vaginal Bleeding   OB History   Grav Para Term Preterm Abortions TAB SAB Ect Mult Living   2 1 1  0 0 0 0 0 0 1      Past Medical History  Diagnosis Date  . Panic attack   . Obesity     Past Surgical History  Procedure Laterality Date  . Mouth surgery      Family History  Problem Relation Age of Onset  . Diabetes Other   . Diabetes Mother   . HIV Mother     History  Substance Use Topics  . Smoking status: Former Smoker    Types: Cigarettes    Quit date: 12/25/2012  . Smokeless tobacco: Not on file  . Alcohol Use: No     Comment: rare    Allergies: No Known Allergies  Prescriptions prior to admission  Medication Sig Dispense Refill  . ibuprofen (ADVIL,MOTRIN) 200 MG tablet Take 200 mg by mouth every 6 (six) hours as needed for pain (back pain).      . ondansetron (ZOFRAN) 8 MG tablet Take by mouth every 8 (eight) hours as needed for nausea.        ROS: see HPI above, all other systems are negative   Physical Exam   Blood pressure 113/58, pulse 106, temperature 98.8 F (37.1 C), temperature source Oral, resp. rate 20, height 5\' 6"  (1.676 m), weight 232 lb 9.6 oz (105.507 kg), last menstrual period 11/21/2012, SpO2 98.00%.  Chest: Clear Heart: RRR Abdomen: gravid, NT Extremities: WNL  Pelvic exam: normal external genitalia, vulva, vagina, cervix, uterus and adnexa VULVA: normal appearing vulva with no masses, or tenderness, vulvar lesion appearing to be a scratch VAGINA: normal appearing vagina with normal color and discharge, no lesions CERVIX: normal appearing cervix without discharge or lesions UTERUS: uterus is  normal size, shape, consistency and nontender ADNEXA: normal adnexa in size, nontender and no masses.  Closed / TH / High   FHT:  ED Course  IUP at [redacted]w[redacted]d Vaginal spotting Labial lesion  Wet prep - Yeast, Trichimonas GC/CT culture - pending HSV culture - pending CBC - hgb 11.6  D/c home with bleeding precautions Metronidazole 500mg  BID PO x 7 days Terazole PV x 7days at HS F/u with already scheduled appointment at [facility] 04/05/13     Haroldine Laws CNM, MSN 04/04/2013 4:17 PM

## 2013-04-04 NOTE — MAU Note (Signed)
Haroldine Laws CNM informed of pt' s presence on the unit.

## 2013-04-04 NOTE — Progress Notes (Signed)
Pt states she states bleeding "was a lot at home and when i was in the  Bathroom here  it wasn't  a lot"

## 2013-04-04 NOTE — MAU Note (Signed)
Patient states she woke up about 30 minutes ago and had blood on the tissue with wiping. Patient is not wearing a pad at this. Denies abdominal pain, does have vaginal pain and itching.

## 2013-04-04 NOTE — MAU Note (Addendum)
Pt states she started having vaginal bleeding, about 1435. Pt states felt something wet coming down her leg.pt states she wiped with a tissue and noted it was blood. Pt adds that she fell on her back Tuesday

## 2013-04-05 LAB — GC/CHLAMYDIA PROBE AMP
CT Probe RNA: NEGATIVE
GC Probe RNA: NEGATIVE

## 2013-04-08 LAB — HERPES SIMPLEX VIRUS CULTURE: Culture: NOT DETECTED

## 2013-04-26 ENCOUNTER — Encounter (HOSPITAL_COMMUNITY): Payer: Self-pay | Admitting: *Deleted

## 2013-04-26 ENCOUNTER — Inpatient Hospital Stay (HOSPITAL_COMMUNITY)
Admission: AD | Admit: 2013-04-26 | Discharge: 2013-04-26 | Disposition: A | Discharge status: Home / Self Care | Payer: Medicaid Other | Source: Ambulatory Visit | Attending: Obstetrics and Gynecology | Admitting: Obstetrics and Gynecology

## 2013-04-26 DIAGNOSIS — R05 Cough: Secondary | ICD-10-CM | POA: Insufficient documentation

## 2013-04-26 DIAGNOSIS — R51 Headache: Secondary | ICD-10-CM | POA: Insufficient documentation

## 2013-04-26 DIAGNOSIS — O212 Late vomiting of pregnancy: Secondary | ICD-10-CM | POA: Insufficient documentation

## 2013-04-26 DIAGNOSIS — R059 Cough, unspecified: Secondary | ICD-10-CM | POA: Insufficient documentation

## 2013-04-26 DIAGNOSIS — O368190 Decreased fetal movements, unspecified trimester, not applicable or unspecified: Secondary | ICD-10-CM | POA: Insufficient documentation

## 2013-04-26 HISTORY — DX: Depression, unspecified: F32.A

## 2013-04-26 HISTORY — DX: Major depressive disorder, single episode, unspecified: F32.9

## 2013-04-26 LAB — URINALYSIS, ROUTINE W REFLEX MICROSCOPIC
Bilirubin Urine: NEGATIVE
Glucose, UA: NEGATIVE mg/dL
Hgb urine dipstick: NEGATIVE
Ketones, ur: 40 mg/dL — AB
Leukocytes, UA: NEGATIVE
Nitrite: NEGATIVE
Protein, ur: NEGATIVE mg/dL
Specific Gravity, Urine: 1.025 (ref 1.005–1.030)
Urobilinogen, UA: 0.2 mg/dL (ref 0.0–1.0)
pH: 7 (ref 5.0–8.0)

## 2013-04-26 LAB — COMPREHENSIVE METABOLIC PANEL
ALT: 11 U/L (ref 0–35)
AST: 15 U/L (ref 0–37)
Albumin: 3.6 g/dL (ref 3.5–5.2)
Alkaline Phosphatase: 108 U/L (ref 39–117)
BUN: 6 mg/dL (ref 6–23)
CO2: 24 mEq/L (ref 19–32)
Calcium: 10.1 mg/dL (ref 8.4–10.5)
Chloride: 101 mEq/L (ref 96–112)
Creatinine, Ser: 0.6 mg/dL (ref 0.50–1.10)
GFR calc Af Amer: >90 mL/min (ref >90)
GFR calc non Af Amer: >90 mL/min (ref >90)
Glucose, Bld: 86 mg/dL (ref 70–99)
Potassium: 4.1 mEq/L (ref 3.5–5.1)
Sodium: 137 mEq/L (ref 135–145)
Total Bilirubin: 0.3 mg/dL (ref 0.3–1.2)
Total Protein: 7.5 g/dL (ref 6.0–8.3)

## 2013-04-26 LAB — CBC WITH DIFFERENTIAL/PLATELET
Basophils Absolute: 0 10*3/uL (ref 0.0–0.1)
Basophils Relative: 0 % (ref 0–1)
Eosinophils Absolute: 0 10*3/uL (ref 0.0–0.7)
Eosinophils Relative: 0 % (ref 0–5)
HCT: 37.6 % (ref 36.0–46.0)
Hemoglobin: 12.7 g/dL (ref 12.0–15.0)
Lymphocytes Relative: 10 % — ABNORMAL LOW (ref 12–46)
Lymphs Abs: 1.5 10*3/uL (ref 0.7–4.0)
MCH: 27.1 pg (ref 26.0–34.0)
MCHC: 33.8 g/dL (ref 30.0–36.0)
MCV: 80.3 fL (ref 78.0–100.0)
Monocytes Absolute: 0.7 10*3/uL (ref 0.1–1.0)
Monocytes Relative: 4 % (ref 3–12)
Neutro Abs: 13.1 10*3/uL — ABNORMAL HIGH (ref 1.7–7.7)
Neutrophils Relative %: 86 % — ABNORMAL HIGH (ref 43–77)
Platelets: 460 10*3/uL — ABNORMAL HIGH (ref 150–400)
RBC: 4.68 MIL/uL (ref 3.87–5.11)
RDW: 13.6 % (ref 11.5–15.5)
WBC: 15.3 10*3/uL — ABNORMAL HIGH (ref 4.0–10.5)

## 2013-04-26 LAB — AMYLASE: Amylase: 136 U/L — ABNORMAL HIGH (ref 0–105)

## 2013-04-26 LAB — LIPASE, BLOOD: Lipase: 16 U/L (ref 11–59)

## 2013-04-26 MED ORDER — LACTATED RINGERS IV SOLN
INTRAVENOUS | Status: DC
Start: 2013-04-26 — End: 2013-04-26
  Administered 2013-04-26: 18:00:00 via INTRAVENOUS

## 2013-04-26 MED ORDER — ONDANSETRON 8 MG/NS 50 ML IVPB
8.0000 mg | Freq: Once | INTRAVENOUS | Status: AC
  Administered 2013-04-26: 8 mg via INTRAVENOUS
  Filled 2013-04-26: qty 8

## 2013-04-26 NOTE — MAU Note (Signed)
States if she takes Zofran as soon as she gets up, she is fine. If she waits, nothing seems to work. Sometimes she does not have it with her.

## 2013-04-26 NOTE — MAU Note (Signed)
Patient states she has had nausea with vomiting since 0600 today, had a fever of 100.1 at 0900 that went away with a nap. Has had chills off and on. Has had a cough since 9-4, worse with coughing and has had a headache today. Denies bleeding, leaking or contractions. Has not felt fetal movement since last night. Fetal heart rate in triage in the 140's with audible movement.

## 2013-04-26 NOTE — MAU Provider Note (Signed)
  History     CSN: [id number]  Arrival date and time: 04/26/13 1625   None     Chief Complaint  Patient presents with  . Emesis  . Fever  . Cough  . Headache   HPI Comments: Pt is a 22yo G32P1 at [redacted]w[redacted]d that arrives w persistent n/v, she's struggled with NVP, and had been managing well, but did not take zofran this AM, like she normally does, so today she has not been able to cope w sx's.   Emesis  Associated symptoms include coughing, a fever and headaches. Pertinent negatives include no abdominal pain or dizziness.  Fever  Associated symptoms include coughing, headaches, nausea and vomiting. Pertinent negatives include no abdominal pain.  Cough Associated symptoms include a fever and headaches.  Headache  Associated symptoms include coughing, a fever, nausea and vomiting. Pertinent negatives include no abdominal pain or dizziness.      Past Medical History  Diagnosis Date  . Panic attack   . Obesity   . Depression     Past Surgical History  Procedure Laterality Date  . Mouth surgery      Family History  Problem Relation Age of Onset  . Diabetes Other   . Diabetes Mother   . HIV Mother     History  Substance Use Topics  . Smoking status: Former Smoker    Types: Cigarettes    Quit date: 12/25/2012  . Smokeless tobacco: Never Used  . Alcohol Use: No     Comment: rare    Allergies: No Known Allergies  No prescriptions prior to admission    Review of Systems  Constitutional: Positive for fever.  Respiratory: Positive for cough.   Gastrointestinal: Positive for nausea and vomiting. Negative for abdominal pain.  Genitourinary: Negative for dysuria.  Neurological: Positive for headaches. Negative for dizziness and loss of consciousness.  All other systems reviewed and are negative.   Physical Exam   Blood pressure 105/67, pulse 91, temperature 98.5 F (36.9 C), temperature source Oral, resp. rate 18, last menstrual period 11/21/2012, SpO2  100.00%.  Physical Exam  Nursing note and vitals reviewed. Constitutional: She is oriented to person, place, and time. She appears well-developed and well-nourished.  HENT:  Head: Normocephalic.  Eyes: Pupils are equal, round, and reactive to light.  Neck: Normal range of motion.  Cardiovascular: Normal rate.   Respiratory: Effort normal.  GI: Soft.  Musculoskeletal: Normal range of motion.  Neurological: She is alert and oriented to person, place, and time. She has normal reflexes.  Skin: Skin is warm and dry.  Psychiatric: She has a normal mood and affect. Her behavior is normal.    MAU Course  Procedures   Assessment and Plan  IUP at [redacted]w[redacted]d NVP Sx's improved now, tolerated PO challege Pt to continue med at home, again rv'd important of dietary and medication mgmnt  dc'd home in stable condition  F/u [facility] routine   LILLARD,SHELLEY M 04/26/2013, 8:11 PM

## 2013-05-09 ENCOUNTER — Inpatient Hospital Stay (HOSPITAL_COMMUNITY)
Admission: AD | Admit: 2013-05-09 | Discharge: 2013-05-09 | Disposition: A | Discharge status: Home / Self Care | Payer: Medicaid Other | Source: Ambulatory Visit | Attending: Obstetrics and Gynecology | Admitting: Obstetrics and Gynecology

## 2013-05-09 ENCOUNTER — Inpatient Hospital Stay (HOSPITAL_COMMUNITY): Payer: Medicaid Other

## 2013-05-09 ENCOUNTER — Encounter (HOSPITAL_COMMUNITY): Payer: Self-pay | Admitting: *Deleted

## 2013-05-09 DIAGNOSIS — O98819 Other maternal infectious and parasitic diseases complicating pregnancy, unspecified trimester: Secondary | ICD-10-CM | POA: Insufficient documentation

## 2013-05-09 DIAGNOSIS — N760 Acute vaginitis: Secondary | ICD-10-CM

## 2013-05-09 DIAGNOSIS — A5901 Trichomonal vulvovaginitis: Secondary | ICD-10-CM

## 2013-05-09 DIAGNOSIS — O2390 Unspecified genitourinary tract infection in pregnancy, unspecified trimester: Secondary | ICD-10-CM | POA: Insufficient documentation

## 2013-05-09 DIAGNOSIS — A499 Bacterial infection, unspecified: Secondary | ICD-10-CM | POA: Insufficient documentation

## 2013-05-09 DIAGNOSIS — R109 Unspecified abdominal pain: Secondary | ICD-10-CM | POA: Insufficient documentation

## 2013-05-09 DIAGNOSIS — B9689 Other specified bacterial agents as the cause of diseases classified elsewhere: Secondary | ICD-10-CM | POA: Insufficient documentation

## 2013-05-09 DIAGNOSIS — O26899 Other specified pregnancy related conditions, unspecified trimester: Secondary | ICD-10-CM

## 2013-05-09 LAB — URINALYSIS, ROUTINE W REFLEX MICROSCOPIC
Bilirubin Urine: NEGATIVE
Glucose, UA: NEGATIVE mg/dL
Hgb urine dipstick: NEGATIVE
Ketones, ur: NEGATIVE mg/dL
Nitrite: NEGATIVE
Protein, ur: NEGATIVE mg/dL
Specific Gravity, Urine: 1.015 (ref 1.005–1.030)
Urobilinogen, UA: 0.2 mg/dL (ref 0.0–1.0)
pH: 7 (ref 5.0–8.0)

## 2013-05-09 LAB — CBC WITH DIFFERENTIAL/PLATELET
Basophils Absolute: 0.1 10*3/uL (ref 0.0–0.1)
Basophils Relative: 0 % (ref 0–1)
Eosinophils Absolute: 0.1 10*3/uL (ref 0.0–0.7)
Eosinophils Relative: 1 % (ref 0–5)
HCT: 33.2 % — ABNORMAL LOW (ref 36.0–46.0)
Hemoglobin: 11.2 g/dL — ABNORMAL LOW (ref 12.0–15.0)
Lymphocytes Relative: 21 % (ref 12–46)
Lymphs Abs: 2.4 10*3/uL (ref 0.7–4.0)
MCH: 26.9 pg (ref 26.0–34.0)
MCHC: 33.7 g/dL (ref 30.0–36.0)
MCV: 79.8 fL (ref 78.0–100.0)
Monocytes Absolute: 0.8 10*3/uL (ref 0.1–1.0)
Monocytes Relative: 7 % (ref 3–12)
Neutro Abs: 8 10*3/uL — ABNORMAL HIGH (ref 1.7–7.7)
Neutrophils Relative %: 71 % (ref 43–77)
Platelets: 400 10*3/uL (ref 150–400)
RBC: 4.16 MIL/uL (ref 3.87–5.11)
RDW: 13.3 % (ref 11.5–15.5)
WBC: 11.3 10*3/uL — ABNORMAL HIGH (ref 4.0–10.5)

## 2013-05-09 LAB — COMPREHENSIVE METABOLIC PANEL
ALT: 11 U/L (ref 0–35)
AST: 12 U/L (ref 0–37)
Albumin: 2.9 g/dL — ABNORMAL LOW (ref 3.5–5.2)
Alkaline Phosphatase: 98 U/L (ref 39–117)
BUN: 5 mg/dL — ABNORMAL LOW (ref 6–23)
CO2: 24 mEq/L (ref 19–32)
Calcium: 9.3 mg/dL (ref 8.4–10.5)
Chloride: 102 mEq/L (ref 96–112)
Creatinine, Ser: 0.59 mg/dL (ref 0.50–1.10)
GFR calc Af Amer: >90 mL/min (ref >90)
GFR calc non Af Amer: >90 mL/min (ref >90)
Glucose, Bld: 94 mg/dL (ref 70–99)
Potassium: 3.5 mEq/L (ref 3.5–5.1)
Sodium: 135 mEq/L (ref 135–145)
Total Bilirubin: 0.2 mg/dL — ABNORMAL LOW (ref 0.3–1.2)
Total Protein: 6.2 g/dL (ref 6.0–8.3)

## 2013-05-09 LAB — WET PREP, GENITAL: Yeast Wet Prep HPF POC: NONE SEEN

## 2013-05-09 LAB — URINE MICROSCOPIC-ADD ON

## 2013-05-09 LAB — FETAL FIBRONECTIN: Fetal Fibronectin: NEGATIVE

## 2013-05-09 LAB — URIC ACID: Uric Acid, Serum: 3.8 mg/dL (ref 2.4–7.0)

## 2013-05-09 LAB — AMYLASE: Amylase: 101 U/L (ref 0–105)

## 2013-05-09 LAB — LIPASE, BLOOD: Lipase: 15 U/L (ref 11–59)

## 2013-05-09 MED ORDER — ONDANSETRON 8 MG PO TBDP
8.0000 mg | ORAL_TABLET | Freq: Once | ORAL | Status: AC
  Administered 2013-05-09: 8 mg via ORAL
  Filled 2013-05-09: qty 1

## 2013-05-09 MED ORDER — METRONIDAZOLE 500 MG PO TABS: 500.0000 mg | ORAL_TABLET | Freq: Two times a day (BID) | ORAL | Status: DC

## 2013-05-09 NOTE — Progress Notes (Signed)
MD informed of pt's C/O severe LUQ & lower abd pain, see orders for labs & U/S.

## 2013-05-09 NOTE — Progress Notes (Signed)
MD informed of lab & U/S results, is on her way to do C/S, requests that MAU provider see pt.  Orders for GC/Chlamydia cultures received.

## 2013-05-09 NOTE — MAU Note (Signed)
Pt had sudden onset of severe LUQ pain @ 0730 this a.m., states she had some cramping in this area for 2 days.  Saw her MD yesterday & mentioned the pain.  Denies bleeding or LOF.

## 2013-05-09 NOTE — MAU Provider Note (Signed)
History     CSN: [id number]  Arrival date and time: 05/09/13 4098   First Provider Initiated Contact with Patient 05/09/13 1126      Chief Complaint  Patient presents with  . Abdominal Pain   HPI Pt is G2P1001 @[redacted]w[redacted]d  pregnant who presents with sudden onset of LUQ pain since this morning at 7:30am.   Pt states she has had cramping for 2 days.  Pt denies bleeding or loss of fluid.  Pt has hx of trichomonas but  Has not had sex since she was treated.  I was asked to see pt to further evaluate and do GC/Chlamdyia. At this time, pt's LUQ pain has resolved; pt still has some diffuse lower abdominal pain, but pain has improved. Pt denies nausea, vomiting, diarrhea, chills, fever Rn Note: Berneice Heinrich, RN Registered Nurse Signed  MAU Note Service date: 05/09/2013 8:03 AM   Pt had sudden onset of severe LUQ pain @ 0730 this a.m., states she had some cramping in this area for 2 days. Saw her MD yesterday & mentioned the pain. Denies bleeding or LOF.       Past Medical History  Diagnosis Date  . Panic attack   . Obesity   . Depression     Past Surgical History  Procedure Laterality Date  . Mouth surgery      Family History  Problem Relation Age of Onset  . Diabetes Other   . Diabetes Mother   . HIV Mother     History  Substance Use Topics  . Smoking status: Former Smoker    Types: Cigarettes    Quit date: 12/25/2012  . Smokeless tobacco: Never Used  . Alcohol Use: No     Comment: rare    Allergies: No Known Allergies  Prescriptions prior to admission  Medication Sig Dispense Refill  . ondansetron (ZOFRAN) 8 MG tablet Take 8 mg by mouth daily as needed for nausea.         Review of Systems  Constitutional: Negative for fever and chills.  Gastrointestinal: Positive for nausea and abdominal pain. Negative for vomiting, diarrhea and constipation.  Genitourinary: Negative for dysuria and frequency.   Physical Exam   Blood pressure 99/69, pulse 115, temperature  98.5 F (36.9 C), temperature source Oral, resp. rate 24, last menstrual period 11/21/2012.  Physical Exam  Nursing note and vitals reviewed. Constitutional: She is oriented to person, place, and time. She appears well-developed and well-nourished.  HENT:  Head: Normocephalic.  Eyes: Pupils are equal, round, and reactive to light.  Neck: Normal range of motion. Neck supple.  Cardiovascular: Normal rate.   Respiratory: Effort normal.  GI: Soft.  FHR reassuring for gestational age; no ctx noted  Genitourinary:  Mod-large amount of frothy white discharge in vault; cervix parous thick, closed, NT  Musculoskeletal: Normal range of motion.  Neurological: She is alert and oriented to person, place, and time.  Skin: Skin is warm and dry.  Psychiatric: She has a normal mood and affect.    MAU Course  Procedures Orders put in by Dr. Estanislado Pandy- results reviewed with Dr. Estanislado Pandy by RN MD informed of lab & U/S results, is on her way to do C/S, requests that MAU provider see pt. Orders for GC/Chlamydia cultures received.    Results for orders placed during the hospital encounter of 05/09/13 (from the past 24 hour(s))  CBC WITH DIFFERENTIAL     Status: Abnormal   Collection Time    05/09/13  8:26 AM  Result Value Range   WBC 11.3 (*) 4.0 - 10.5 K/uL   RBC 4.16  3.87 - 5.11 MIL/uL   Hemoglobin 11.2 (*) 12.0 - 15.0 g/dL   HCT 16.1 (*) 09.6 - 04.5 %   MCV 79.8  78.0 - 100.0 fL   MCH 26.9  26.0 - 34.0 pg   MCHC 33.7  30.0 - 36.0 g/dL   RDW 40.9  81.1 - 91.4 %   Platelets 400  150 - 400 K/uL   Neutrophils Relative % 71  43 - 77 %   Neutro Abs 8.0 (*) 1.7 - 7.7 K/uL   Lymphocytes Relative 21  12 - 46 %   Lymphs Abs 2.4  0.7 - 4.0 K/uL   Monocytes Relative 7  3 - 12 %   Monocytes Absolute 0.8  0.1 - 1.0 K/uL   Eosinophils Relative 1  0 - 5 %   Eosinophils Absolute 0.1  0.0 - 0.7 K/uL   Basophils Relative 0  0 - 1 %   Basophils Absolute 0.1  0.0 - 0.1 K/uL  COMPREHENSIVE METABOLIC PANEL      Status: Abnormal   Collection Time    05/09/13  8:26 AM      Result Value Range   Sodium 135  135 - 145 mEq/L   Potassium 3.5  3.5 - 5.1 mEq/L   Chloride 102  96 - 112 mEq/L   CO2 24  19 - 32 mEq/L   Glucose, Bld 94  70 - 99 mg/dL   BUN 5 (*) 6 - 23 mg/dL   Creatinine, Ser 7.82  0.50 - 1.10 mg/dL   Calcium 9.3  8.4 - 95.6 mg/dL   Total Protein 6.2  6.0 - 8.3 g/dL   Albumin 2.9 (*) 3.5 - 5.2 g/dL   AST 12  0 - 37 U/L   ALT 11  0 - 35 U/L   Alkaline Phosphatase 98  39 - 117 U/L   Total Bilirubin 0.2 (*) 0.3 - 1.2 mg/dL   GFR calc non Af Amer >90  >90 mL/min   GFR calc Af Amer >90  >90 mL/min  AMYLASE     Status: None   Collection Time    05/09/13  8:26 AM      Result Value Range   Amylase 101  0 - 105 U/L  LIPASE, BLOOD     Status: None   Collection Time    05/09/13  8:26 AM      Result Value Range   Lipase 15  11 - 59 U/L  URIC ACID     Status: None   Collection Time    05/09/13  8:26 AM      Result Value Range   Uric Acid, Serum 3.8  2.4 - 7.0 mg/dL  FETAL FIBRONECTIN     Status: None   Collection Time    05/09/13  8:26 AM      Result Value Range   Fetal Fibronectin NEGATIVE  NEGATIVE  WET PREP, GENITAL     Status: Abnormal   Collection Time    05/09/13  8:27 AM      Result Value Range   Yeast Wet Prep HPF POC NONE SEEN  NONE SEEN   Trich, Wet Prep MODERATE (*) NONE SEEN   Clue Cells Wet Prep HPF POC FEW (*) NONE SEEN   WBC, Wet Prep HPF POC FEW (*) NONE SEEN  URINALYSIS, ROUTINE W REFLEX MICROSCOPIC     Status: Abnormal  Collection Time    05/09/13  8:50 AM      Result Value Range   Color, Urine YELLOW  YELLOW   APPearance HAZY (*) CLEAR   Specific Gravity, Urine 1.015  1.005 - 1.030   pH 7.0  5.0 - 8.0   Glucose, UA NEGATIVE  NEGATIVE mg/dL   Hgb urine dipstick NEGATIVE  NEGATIVE   Bilirubin Urine NEGATIVE  NEGATIVE   Ketones, ur NEGATIVE  NEGATIVE mg/dL   Protein, ur NEGATIVE  NEGATIVE mg/dL   Urobilinogen, UA 0.2  0.0 - 1.0 mg/dL   Nitrite  NEGATIVE  NEGATIVE   Leukocytes, UA MODERATE (*) NEGATIVE  URINE MICROSCOPIC-ADD ON     Status: Abnormal   Collection Time    05/09/13  8:50 AM      Result Value Range   Squamous Epithelial / LPF MANY (*) RARE   WBC, UA 3-6  <3 WBC/hpf   RBC / HPF 0-2  <3 RBC/hpf   Bacteria, UA RARE  RARE   Urine-Other AMORPHOUS URATES/PHOSPHATES    pt has both trich and BV, so will give prescription for flagyl 500mg  BID for 7 days US Abdomen Complete  05/09/2013   CLINICAL DATA:  Left upper quadrant pain.  EXAM: ABDOMEN ULTRASOUND  COMPARISON:  None.  FINDINGS: Gallbladder  No gallstones or wall thickening. Negative sonographic Murphy's sign.  Common bile duct  Diameter: Normal caliber, 5 mm.  Liver  No focal lesion identified. Within normal limits in parenchymal echogenicity.  IVC  No abnormality visualized.  Pancreas  Visualized portion unremarkable.  Spleen  Size and appearance within normal limits.  Right Kidney  Length: 10.3 cm. Echogenicity within normal limits. No mass or hydronephrosis visualized.  Left Kidney  Length: 11.3 cm. Echogenicity within normal limits. No mass or hydronephrosis visualized.  Abdominal aorta  No aneurysm visualized.  IMPRESSION: No acute findings.   Electronically Signed   By: Charlett Nose M.D.   On: 05/09/2013 10:03  GC Chlamydia pending Pt's LUQ pain resolved and pt's lower abd pain improved to 2/10 Assessment and Plan  Abdominal pain in pregnancy- normal labs and ultrasound Information given on round ligament pain Trichomonas and BV-Flagyl 500mg  BID for 7 days F/u with OB appointment  LINEBERRY,SUSAN 05/09/2013, 11:27 AM

## 2013-05-10 LAB — GC/CHLAMYDIA PROBE AMP
CT Probe RNA: NEGATIVE
GC Probe RNA: NEGATIVE

## 2013-06-17 ENCOUNTER — Encounter (HOSPITAL_COMMUNITY): Payer: Self-pay

## 2013-06-17 ENCOUNTER — Inpatient Hospital Stay (HOSPITAL_COMMUNITY)
Admission: AD | Admit: 2013-06-17 | Discharge: 2013-06-17 | Disposition: A | Discharge status: Home / Self Care | Payer: Medicaid Other | Source: Ambulatory Visit | Attending: Obstetrics and Gynecology | Admitting: Obstetrics and Gynecology

## 2013-06-17 DIAGNOSIS — O212 Late vomiting of pregnancy: Secondary | ICD-10-CM | POA: Insufficient documentation

## 2013-06-17 DIAGNOSIS — R112 Nausea with vomiting, unspecified: Secondary | ICD-10-CM

## 2013-06-17 DIAGNOSIS — E860 Dehydration: Secondary | ICD-10-CM | POA: Insufficient documentation

## 2013-06-17 DIAGNOSIS — R109 Unspecified abdominal pain: Secondary | ICD-10-CM | POA: Insufficient documentation

## 2013-06-17 DIAGNOSIS — O99891 Other specified diseases and conditions complicating pregnancy: Secondary | ICD-10-CM | POA: Insufficient documentation

## 2013-06-17 DIAGNOSIS — Z3403 Encounter for supervision of normal first pregnancy, third trimester: Secondary | ICD-10-CM

## 2013-06-17 DIAGNOSIS — K219 Gastro-esophageal reflux disease without esophagitis: Secondary | ICD-10-CM | POA: Insufficient documentation

## 2013-06-17 LAB — URINE MICROSCOPIC-ADD ON

## 2013-06-17 LAB — URINALYSIS, ROUTINE W REFLEX MICROSCOPIC
Bilirubin Urine: NEGATIVE
Glucose, UA: NEGATIVE mg/dL
Ketones, ur: 15 mg/dL — AB
Nitrite: NEGATIVE
Protein, ur: 30 mg/dL — AB
Specific Gravity, Urine: >1.03 — ABNORMAL HIGH (ref 1.005–1.030)
Urobilinogen, UA: 0.2 mg/dL (ref 0.0–1.0)
pH: 6 (ref 5.0–8.0)

## 2013-06-17 MED ORDER — LACTATED RINGERS IV SOLN
Freq: Once | INTRAVENOUS | Status: AC
  Administered 2013-06-17: 16:00:00 via INTRAVENOUS

## 2013-06-17 MED ORDER — ONDANSETRON 8 MG PO TBDP: 8.0000 mg | ORAL_TABLET | Freq: Three times a day (TID) | ORAL | Status: DC | PRN

## 2013-06-17 MED ORDER — ONDANSETRON 8 MG/NS 50 ML IVPB
8.0000 mg | Freq: Once | INTRAVENOUS | Status: AC
  Administered 2013-06-17: 8 mg via INTRAVENOUS
  Filled 2013-06-17: qty 8

## 2013-06-17 MED ORDER — PANTOPRAZOLE SODIUM 40 MG PO TBEC: 40.0000 mg | DELAYED_RELEASE_TABLET | Freq: Every day | ORAL | Status: DC

## 2013-06-17 NOTE — Progress Notes (Signed)
Dr Pennie Rushing notified of patient and her presenting c/o n/v and abdominal pressure. Order received for 1 liter of LR bolus and 8mg  zofran iv. Will come to see patient.

## 2013-06-17 NOTE — MAU Provider Note (Signed)
History     CSN: [id number]  Arrival date & time 06/17/13  1532   None     Chief Complaint  Patient presents with  . Emesis  . Abdominal Pain    HPI Pt presents with onset of nausea and vomiting on Sat.  This has been a problem during the entire pregnancy and has been well managed with Zofran. She ran out of Zofran and tried marijuana for nausea relief without benefit.    Past Medical History  Diagnosis Date  . Panic attack   . Obesity   . Depression     Past Surgical History  Procedure Laterality Date  . Mouth surgery      Family History  Problem Relation Age of Onset  . Diabetes Other   . Diabetes Mother   . HIV Mother     History  Substance Use Topics  . Smoking status: Former Smoker    Types: Cigarettes    Quit date: 12/25/2012  . Smokeless tobacco: Never Used  . Alcohol Use: No     Comment: rare    OB History   Grav Para Term Preterm Abortions TAB SAB Ect Mult Living   2 1 1  0 0 0 0 0 0 1      Review of Systems Neg except as above and reflux at night.  Allergies  Review of patient's allergies indicates no known allergies.  Home Medications   Current Outpatient Rx  Name  Route  Sig  Dispense  Refill  . ondansetron (ZOFRAN ODT) 8 MG disintegrating tablet   Oral   Take 1 tablet (8 mg total) by mouth every 8 (eight) hours as needed for nausea.   20 tablet   3   . pantoprazole (PROTONIX) 40 MG tablet   Oral   Take 1 tablet (40 mg total) by mouth daily.   30 tablet   2     BP 117/74  Pulse 96  Resp 18  Ht 5' 5.5" (1.664 m)  Wt 230 lb 9.6 oz (104.599 kg)  BMI 37.78 kg/m2  SpO2 98%  LMP 11/21/2012  Physical Exam Cx Long/closed and high  Monitoring:  FHR Cat 1.  No contractions MAU Course  Procedures (including critical care time) IV fluids Zofran Po fluids  Labs Reviewed  URINALYSIS, ROUTINE W REFLEX MICROSCOPIC - Abnormal; Notable for the following:    APPearance CLOUDY (*)    Specific Gravity, Urine >1.030 (*)    Hgb  urine dipstick TRACE (*)    Ketones, ur 15 (*)    Protein, ur 30 (*)    Leukocytes, UA MODERATE (*)    All other components within normal limits  URINE MICROSCOPIC-ADD ON - Abnormal; Notable for the following:    Squamous Epithelial / LPF MANY (*)    Bacteria, UA MANY (*)    All other components within normal limits  URINE CULTURE   No results found.  Assessment 1. Nausea with vomiting   2. Supervision of normal first pregnancy, third trimester   3.  GERD 4.  Dehydration 5.  R/o UTI   MDM  Plan IV fluids given with single dose of Zofran and relief of sx.  Pt was able to eat crackers and drink juice prior to discharge. Zofran refilled Protonix for rx of GERD given Urine C&S sent Keep 06/19/13 appt.

## 2013-06-17 NOTE — MAU Note (Signed)
Patient states she has had vomiting for the entire pregnancy, but worse since this am and has seen blood in the vomit. Started having lower abdominal pain this am. Denies bleeding or leaking and reports good fetal movement. Denies diarrhea.

## 2013-06-17 NOTE — MAU Note (Signed)
Patient is in with c/o nausea and vomiting since today. She states that she "ran out" of her zofran 8mg  this Saturday, she did  Not vomit on Sunday. She started vomiting today and c/o lower abdominal pressure. Denies cramping or contraction, vaginal bleeding or lof. She reports good fetal movement.

## 2013-06-18 LAB — URINE CULTURE: Colony Count: 40000

## 2013-07-21 ENCOUNTER — Encounter (HOSPITAL_COMMUNITY): Payer: Self-pay | Admitting: *Deleted

## 2013-07-21 ENCOUNTER — Inpatient Hospital Stay (HOSPITAL_COMMUNITY)
Admission: AD | Admit: 2013-07-21 | Discharge: 2013-07-21 | Disposition: A | Discharge status: Home / Self Care | Payer: Medicaid Other | Source: Ambulatory Visit | Attending: Obstetrics and Gynecology | Admitting: Obstetrics and Gynecology

## 2013-07-21 DIAGNOSIS — O479 False labor, unspecified: Secondary | ICD-10-CM | POA: Insufficient documentation

## 2013-07-21 HISTORY — DX: Trichomonal vulvovaginitis: A59.01

## 2013-07-21 NOTE — MAU Note (Signed)
Pt arrived via EMS with c/o contractions all day approx 10 min apart with sharp, shooting pain down left leg only with contractions. Denies any bleeding or leaking.  Pt reports that contractions have subsided since she arrived in MAU.

## 2013-08-02 LAB — OB RESULTS CONSOLE GBS: GBS: POSITIVE

## 2013-08-02 LAB — OB RESULTS CONSOLE GC/CHLAMYDIA
Chlamydia: NEGATIVE
Gonorrhea: NEGATIVE

## 2013-08-16 ENCOUNTER — Encounter (HOSPITAL_COMMUNITY): Payer: Self-pay | Admitting: *Deleted

## 2013-08-16 ENCOUNTER — Inpatient Hospital Stay (HOSPITAL_COMMUNITY)
Admission: AD | Admit: 2013-08-16 | Discharge: 2013-08-16 | Disposition: A | Discharge status: Home / Self Care | Payer: Medicaid Other | Source: Ambulatory Visit | Attending: Obstetrics and Gynecology | Admitting: Obstetrics and Gynecology

## 2013-08-16 DIAGNOSIS — O479 False labor, unspecified: Secondary | ICD-10-CM | POA: Insufficient documentation

## 2013-08-16 DIAGNOSIS — N949 Unspecified condition associated with female genital organs and menstrual cycle: Secondary | ICD-10-CM | POA: Insufficient documentation

## 2013-08-16 NOTE — MAU Note (Signed)
States "my butt hurts", describes occ UC and general pelvic and rectal pressure, good FM, NO fluid, pink discharge last night

## 2013-08-17 ENCOUNTER — Inpatient Hospital Stay (HOSPITAL_COMMUNITY)
Admission: AD | Admit: 2013-08-17 | Discharge: 2013-08-17 | Disposition: A | Discharge status: Home / Self Care | Payer: Medicaid Other | Source: Ambulatory Visit | Attending: Obstetrics and Gynecology | Admitting: Obstetrics and Gynecology

## 2013-08-17 ENCOUNTER — Encounter (HOSPITAL_COMMUNITY): Payer: Self-pay

## 2013-08-17 DIAGNOSIS — Z22330 Carrier of Group B streptococcus: Secondary | ICD-10-CM | POA: Insufficient documentation

## 2013-08-17 DIAGNOSIS — O99891 Other specified diseases and conditions complicating pregnancy: Secondary | ICD-10-CM | POA: Insufficient documentation

## 2013-08-17 DIAGNOSIS — A749 Chlamydial infection, unspecified: Secondary | ICD-10-CM | POA: Diagnosis not present

## 2013-08-17 DIAGNOSIS — O99820 Streptococcus B carrier state complicating pregnancy: Secondary | ICD-10-CM

## 2013-08-17 DIAGNOSIS — Z87891 Personal history of nicotine dependence: Secondary | ICD-10-CM | POA: Insufficient documentation

## 2013-08-17 DIAGNOSIS — O479 False labor, unspecified: Secondary | ICD-10-CM | POA: Insufficient documentation

## 2013-08-17 LAB — AMNISURE RUPTURE OF MEMBRANE (ROM) NOT AT ARMC: Amnisure ROM: NEGATIVE

## 2013-08-17 NOTE — MAU Note (Signed)
Contractions stronger at 5 am, now they are 5-6 min apart.  Reports leaking since around 4 pm, clear watery small amt.  No bleeding.

## 2013-08-17 NOTE — MAU Provider Note (Signed)
History   22 yo G2P1001 at 24 3/7 weeks presented unannounced c/o UCs q 5-6 min and ? Leaking.  Seen by MAU staff yesterday for labor check--cervix then was 4 cm, 50%, vtx, -2, with minimal contractions.    Patient Active Problem List   Diagnosis Date Noted  . GBS (group B Streptococcus carrier), +RV culture, currently pregnant 08/17/2013  . Chlamydia infection complicating pregnancy 12/2012, negative TOC 08/17/2013  . Major depressive disorder, single episode, severe, without mention of psychotic behavior 03/21/2013  . Panic attack      Chief Complaint  Patient presents with  . Labor Eval   HPI:  See above  OB History   Grav Para Term Preterm Abortions TAB SAB Ect Mult Living   2 1 1  0 0 0 0 0 0 1      Past Medical History  Diagnosis Date  . Panic attack   . Obesity   . Depression   . Trichomonal vaginitis     Past Surgical History  Procedure Laterality Date  . Mouth surgery    . Tonsillectomy    . Addenoidectomy      Family History  Problem Relation Age of Onset  . Diabetes Other   . Diabetes Mother   . HIV Mother     History  Substance Use Topics  . Smoking status: Former Smoker    Types: Cigarettes    Quit date: 12/25/2012  . Smokeless tobacco: Never Used  . Alcohol Use: No     Comment: rare    Allergies: No Known Allergies  Prescriptions prior to admission  Medication Sig Dispense Refill  . ondansetron (ZOFRAN ODT) 8 MG disintegrating tablet Take 1 tablet (8 mg total) by mouth every 8 (eight) hours as needed for nausea.  20 tablet  3  . pantoprazole (PROTONIX) 40 MG tablet Take 1 tablet (40 mg total) by mouth daily.  30 tablet  2    ROS:  ? Leaking, occasional contractions Physical Exam   Blood pressure 108/72, pulse 101, temperature 98.2 F (36.8 C), temperature source Oral, resp. rate 20, last menstrual period 11/21/2012.  Physical Exam Chest clear  Heart RRR without murmur Abd gravid, NT Pelvic--cervix very posterior, 3-4 cm, vtx, -2  (no change from yesterday's exam) Ext WNL  FHR Category 1 UCs very irregular, mild.  Results for orders placed during the hospital encounter of 08/17/13 (from the past 24 hour(s))  AMNISURE RUPTURE OF MEMBRANE (ROM)     Status: None   Collection Time    08/17/13  8:52 PM      Result Value Range   Amnisure ROM NEGATIVE       ED Course  IUP at 38 3/7 weeks Amnisure negative Reactive NST  Plan: D/C home with labor precautions. Will have patient come to office at 1:45pm on Monday for ROB appt with me. To f/u prn.   Nigel Bridgeman CNM, [place] 08/17/2013 10:29 PM

## 2013-08-17 NOTE — Progress Notes (Signed)
V. Latham CNM in earlier to discuss d/c plan. Written and verbal d/c instructions given and understanding voiced. 

## 2013-08-22 ENCOUNTER — Encounter (HOSPITAL_COMMUNITY): Payer: Self-pay | Admitting: *Deleted

## 2013-08-22 ENCOUNTER — Inpatient Hospital Stay (HOSPITAL_COMMUNITY)
Admission: AD | Admit: 2013-08-22 | Discharge: 2013-08-22 | Disposition: A | Discharge status: Home / Self Care | Payer: Medicaid Other | Source: Ambulatory Visit | Attending: Obstetrics and Gynecology | Admitting: Obstetrics and Gynecology

## 2013-08-22 DIAGNOSIS — Z87891 Personal history of nicotine dependence: Secondary | ICD-10-CM | POA: Insufficient documentation

## 2013-08-22 DIAGNOSIS — O479 False labor, unspecified: Secondary | ICD-10-CM | POA: Insufficient documentation

## 2013-08-22 NOTE — MAU Provider Note (Signed)
History   22yo, G2P1001 at [redacted]w[redacted]d presents for labor check.  UCs started at 0300 and have gotten progressively worse over the last hour. Denies VB, LOF, recent fever, resp or GI c/o's, UTI or PIH s/s. GFM. Last checked on Monday - 4 cm per patient.   Chief Complaint  Patient presents with  . Contractions   HPI  OB History   Grav Para Term Preterm Abortions TAB SAB Ect Mult Living   2 1 1  0 0 0 0 0 0 1      Past Medical History  Diagnosis Date  . Panic attack   . Obesity   . Depression   . Trichomonal vaginitis     Past Surgical History  Procedure Laterality Date  . Mouth surgery    . Tonsillectomy    . Addenoidectomy      Family History  Problem Relation Age of Onset  . Diabetes Other   . Diabetes Mother   . HIV Mother     History  Substance Use Topics  . Smoking status: Former Smoker    Types: Cigarettes    Quit date: 12/25/2012  . Smokeless tobacco: Never Used  . Alcohol Use: No     Comment: rare    Allergies: No Known Allergies  Prescriptions prior to admission  Medication Sig Dispense Refill  . ondansetron (ZOFRAN ODT) 8 MG disintegrating tablet Take 1 tablet (8 mg total) by mouth every 8 (eight) hours as needed for nausea.  20 tablet  3  . pantoprazole (PROTONIX) 40 MG tablet Take 1 tablet (40 mg total) by mouth daily.  30 tablet  2    ROS ROS: see HPI above, all other systems are negative  Physical Exam   Blood pressure 126/64, pulse 111, temperature 98.2 F (36.8 C), temperature source Oral, resp. rate 20, height 5\' 5"  (1.651 m), weight 242 lb (109.77 kg), last menstrual period 11/21/2012, SpO2 100.00%.  Physical Exam Chest: Clear Heart: RRR Abdomen: gravid, NT Extremities: WNL  Dilation: 4 Effacement (%): 50 Cervical Position: Posterior Presentation: Vertex Exam by:: J. Oxley CNM  FHT: Reactive NST UCs: Irregular  ED Course  IUP at [redacted]w[redacted]d Labor check  No cervical change D/c home with precautions to continue early labor at  home F/u 1/5 for an already scheduled ROB    Linda Hedges CNM, MSN 08/22/2013 3:10 PM

## 2013-08-22 NOTE — Discharge Instructions (Signed)

## 2013-08-22 NOTE — L&D Delivery Note (Signed)
Delivery Note Pt progressed quickly with SVE C/C/+2 at 9:08 AM.  Pt pushed effectively with UCs.   At 9:20 AM a viable female was delivered via Vaginal, Spontaneous Delivery (Presentation: Right Occiput Anterior).  APGAR: 8, 9; weight pending.   Placenta status: Intact, Spontaneous.  Cord: 3 vessels with the following complications: .  Cord pH: N/A  Anesthesia:  1% Xylocaine local Episiotomy: None Lacerations: 1st degree lt vaginal wall Suture Repair: 3.0 monocryl Est. Blood Loss (mL): 350  Mom to postpartum.  Baby to Couplet care / Skin to Skin.  [place] O. 08/24/2013, 9:52 AM

## 2013-08-22 NOTE — MAU Note (Signed)
Patient presents to MAU with c/o contractions since 3 am that has gotten progressively worse over the last hour. Denies LOF or VB at this time. Reports good fetal movement. Last checked on MOnday and was 4 cm per patient.

## 2013-08-24 ENCOUNTER — Encounter (HOSPITAL_COMMUNITY): Payer: Self-pay | Admitting: *Deleted

## 2013-08-24 ENCOUNTER — Inpatient Hospital Stay (HOSPITAL_COMMUNITY)
Admission: AD | Admit: 2013-08-24 | Discharge: 2013-08-26 | DRG: 774 | Disposition: A | Discharge status: Home / Self Care | Payer: Medicaid Other | Source: Ambulatory Visit | Attending: Obstetrics and Gynecology | Admitting: Obstetrics and Gynecology

## 2013-08-24 DIAGNOSIS — F3289 Other specified depressive episodes: Secondary | ICD-10-CM | POA: Diagnosis present

## 2013-08-24 DIAGNOSIS — A5901 Trichomonal vulvovaginitis: Secondary | ICD-10-CM | POA: Diagnosis present

## 2013-08-24 DIAGNOSIS — O9989 Other specified diseases and conditions complicating pregnancy, childbirth and the puerperium: Secondary | ICD-10-CM

## 2013-08-24 DIAGNOSIS — A599 Trichomoniasis, unspecified: Secondary | ICD-10-CM | POA: Diagnosis not present

## 2013-08-24 DIAGNOSIS — O99344 Other mental disorders complicating childbirth: Secondary | ICD-10-CM | POA: Diagnosis present

## 2013-08-24 DIAGNOSIS — Z22330 Carrier of Group B streptococcus: Secondary | ICD-10-CM

## 2013-08-24 DIAGNOSIS — O9882 Other maternal infectious and parasitic diseases complicating childbirth: Secondary | ICD-10-CM | POA: Diagnosis present

## 2013-08-24 DIAGNOSIS — O99892 Other specified diseases and conditions complicating childbirth: Secondary | ICD-10-CM | POA: Diagnosis present

## 2013-08-24 DIAGNOSIS — IMO0001 Reserved for inherently not codable concepts without codable children: Secondary | ICD-10-CM

## 2013-08-24 DIAGNOSIS — F329 Major depressive disorder, single episode, unspecified: Secondary | ICD-10-CM | POA: Diagnosis present

## 2013-08-24 LAB — TYPE AND SCREEN
ABO/RH(D): O POS
Antibody Screen: NEGATIVE

## 2013-08-24 LAB — CBC
HCT: 33.6 % — ABNORMAL LOW (ref 36.0–46.0)
Hemoglobin: 11 g/dL — ABNORMAL LOW (ref 12.0–15.0)
MCH: 25.5 pg — ABNORMAL LOW (ref 26.0–34.0)
MCHC: 32.7 g/dL (ref 30.0–36.0)
MCV: 77.8 fL — ABNORMAL LOW (ref 78.0–100.0)
Platelets: 382 10*3/uL (ref 150–400)
RBC: 4.32 MIL/uL (ref 3.87–5.11)
RDW: 13.5 % (ref 11.5–15.5)
WBC: 10.6 10*3/uL — ABNORMAL HIGH (ref 4.0–10.5)

## 2013-08-24 LAB — RPR: RPR Ser Ql: NONREACTIVE

## 2013-08-24 MED ORDER — ONDANSETRON HCL 4 MG/2ML IJ SOLN: 4.0000 mg | Freq: Four times a day (QID) | INTRAMUSCULAR | Status: DC | PRN

## 2013-08-24 MED ORDER — PENICILLIN G POTASSIUM 5000000 UNITS IJ SOLR
2.5000 10*6.[IU] | INTRAVENOUS | Status: DC
  Administered 2013-08-24: 2.5 10*6.[IU] via INTRAVENOUS
  Filled 2013-08-24 (×3): qty 2.5

## 2013-08-24 MED ORDER — PRENATAL MULTIVITAMIN CH
1.0000 | ORAL_TABLET | Freq: Every day | ORAL | Status: DC
  Administered 2013-08-24 – 2013-08-25 (×2): 1 via ORAL
  Filled 2013-08-24 (×2): qty 1

## 2013-08-24 MED ORDER — IBUPROFEN 600 MG PO TABS
600.0000 mg | ORAL_TABLET | Freq: Four times a day (QID) | ORAL | Status: DC | PRN
  Administered 2013-08-24: 600 mg via ORAL
  Filled 2013-08-24: qty 1

## 2013-08-24 MED ORDER — WITCH HAZEL-GLYCERIN EX PADS: 1.0000 "application " | MEDICATED_PAD | CUTANEOUS | Status: DC | PRN

## 2013-08-24 MED ORDER — SIMETHICONE 80 MG PO CHEW
80.0000 mg | CHEWABLE_TABLET | ORAL | Status: DC | PRN
Start: 2013-08-24 — End: 2013-08-26

## 2013-08-24 MED ORDER — LANOLIN HYDROUS EX OINT: TOPICAL_OINTMENT | CUTANEOUS | Status: DC | PRN

## 2013-08-24 MED ORDER — ACETAMINOPHEN 325 MG PO TABS: 650.0000 mg | ORAL_TABLET | ORAL | Status: DC | PRN

## 2013-08-24 MED ORDER — ONDANSETRON HCL 4 MG/2ML IJ SOLN: 4.0000 mg | INTRAMUSCULAR | Status: DC | PRN

## 2013-08-24 MED ORDER — PENICILLIN G POTASSIUM 5000000 UNITS IJ SOLR
5.0000 10*6.[IU] | Freq: Once | INTRAVENOUS | Status: AC
  Administered 2013-08-24: 5 10*6.[IU] via INTRAVENOUS
  Filled 2013-08-24: qty 5

## 2013-08-24 MED ORDER — OXYTOCIN BOLUS FROM INFUSION
500.0000 mL | INTRAVENOUS | Status: DC
  Administered 2013-08-24: 500 mL via INTRAVENOUS

## 2013-08-24 MED ORDER — IBUPROFEN 600 MG PO TABS
600.0000 mg | ORAL_TABLET | Freq: Four times a day (QID) | ORAL | Status: DC
  Administered 2013-08-24 – 2013-08-26 (×7): 600 mg via ORAL
  Filled 2013-08-24 (×7): qty 1

## 2013-08-24 MED ORDER — FENTANYL CITRATE 0.05 MG/ML IJ SOLN
100.0000 ug | INTRAMUSCULAR | Status: DC | PRN
Start: 2013-08-24 — End: 2013-08-24
  Administered 2013-08-24: 100 ug via INTRAVENOUS
  Filled 2013-08-24: qty 2

## 2013-08-24 MED ORDER — ZOLPIDEM TARTRATE 5 MG PO TABS: 5.0000 mg | ORAL_TABLET | Freq: Every evening | ORAL | Status: DC | PRN

## 2013-08-24 MED ORDER — LIDOCAINE HCL (PF) 1 % IJ SOLN
30.0000 mL | INTRAMUSCULAR | Status: AC | PRN
  Administered 2013-08-24: 30 mL via SUBCUTANEOUS
  Filled 2013-08-24 (×2): qty 30

## 2013-08-24 MED ORDER — DIBUCAINE 1 % RE OINT: 1.0000 "application " | TOPICAL_OINTMENT | RECTAL | Status: DC | PRN

## 2013-08-24 MED ORDER — TETANUS-DIPHTH-ACELL PERTUSSIS 5-2.5-18.5 LF-MCG/0.5 IM SUSP
0.5000 mL | Freq: Once | INTRAMUSCULAR | Status: DC
Start: 2013-08-25 — End: 2013-08-26

## 2013-08-24 MED ORDER — BENZOCAINE-MENTHOL 20-0.5 % EX AERO
1.0000 "application " | INHALATION_SPRAY | CUTANEOUS | Status: DC | PRN
  Administered 2013-08-24: 1 via TOPICAL
  Filled 2013-08-24: qty 56

## 2013-08-24 MED ORDER — BUTORPHANOL TARTRATE 1 MG/ML IJ SOLN
1.0000 mg | INTRAMUSCULAR | Status: DC | PRN
  Administered 2013-08-24 (×2): 1 mg via INTRAVENOUS
  Filled 2013-08-24 (×2): qty 1

## 2013-08-24 MED ORDER — CITRIC ACID-SODIUM CITRATE 334-500 MG/5ML PO SOLN: 30.0000 mL | ORAL | Status: DC | PRN

## 2013-08-24 MED ORDER — LACTATED RINGERS IV SOLN: 500.0000 mL | INTRAVENOUS | Status: DC | PRN

## 2013-08-24 MED ORDER — SENNOSIDES-DOCUSATE SODIUM 8.6-50 MG PO TABS
2.0000 | ORAL_TABLET | ORAL | Status: DC
  Administered 2013-08-24 – 2013-08-25 (×2): 2 via ORAL
  Filled 2013-08-24 (×2): qty 2

## 2013-08-24 MED ORDER — OXYCODONE-ACETAMINOPHEN 5-325 MG PO TABS
1.0000 | ORAL_TABLET | ORAL | Status: DC | PRN
  Administered 2013-08-24 – 2013-08-26 (×6): 1 via ORAL
  Filled 2013-08-24 (×6): qty 1

## 2013-08-24 MED ORDER — FLEET ENEMA 7-19 GM/118ML RE ENEM: 1.0000 | ENEMA | RECTAL | Status: DC | PRN

## 2013-08-24 MED ORDER — DIPHENHYDRAMINE HCL 25 MG PO CAPS: 25.0000 mg | ORAL_CAPSULE | Freq: Four times a day (QID) | ORAL | Status: DC | PRN

## 2013-08-24 MED ORDER — GUAIFENESIN-DM 100-10 MG/5ML PO SYRP
5.0000 mL | ORAL_SOLUTION | ORAL | Status: DC | PRN
  Administered 2013-08-24 – 2013-08-25 (×3): 5 mL via ORAL
  Filled 2013-08-24 (×2): qty 5

## 2013-08-24 MED ORDER — LACTATED RINGERS IV SOLN
INTRAVENOUS | Status: DC
  Administered 2013-08-24: 03:00:00 via INTRAVENOUS

## 2013-08-24 MED ORDER — OXYTOCIN 40 UNITS IN LACTATED RINGERS INFUSION - SIMPLE MED
62.5000 mL/h | INTRAVENOUS | Status: DC
  Administered 2013-08-24: 62.5 mL/h via INTRAVENOUS
  Filled 2013-08-24: qty 1000

## 2013-08-24 MED ORDER — ONDANSETRON HCL 4 MG PO TABS: 4.0000 mg | ORAL_TABLET | ORAL | Status: DC | PRN

## 2013-08-24 MED ORDER — OXYCODONE-ACETAMINOPHEN 5-325 MG PO TABS
1.0000 | ORAL_TABLET | ORAL | Status: DC | PRN
  Administered 2013-08-24: 1 via ORAL
  Filled 2013-08-24: qty 1

## 2013-08-24 NOTE — MAU Note (Signed)
Pt reports her ctx are stronger and closer together. Denies  Bleeding or leaking of fluid. Good fetal movement.

## 2013-08-24 NOTE — Lactation Note (Signed)
This note was copied from the chart of Phyllis Dalton. Lactation Consultation Note  Patient Name: Phyllis Dalton [id number] Date: 08/24/2013 Reason for consult: Initial assessment Mom reports baby is nursing well, denies questions or concerns. BF basics reviewed. Lactation brochure left for review, advised of OP services and support group. Advised to call as needed for assist.   Maternal Data Formula Feeding for Exclusion: No Infant to breast within first hour of birth: Yes Has patient been taught Hand Expression?: Yes Does the patient have breastfeeding experience prior to this delivery?: Yes  Feeding Feeding Type: Breast Fed Length of feed: 15 min  LATCH Score/Interventions                      Lactation Tools Discussed/Used WIC Program: Yes   Consult Status Consult Status: Follow-up Date: 08/25/13 Follow-up type: In-patient    Katrine Coho 08/24/2013, 11:12 PM

## 2013-08-24 NOTE — Progress Notes (Signed)
Phyllis Dalton is a 23 y.o. G2P1001 at [redacted]w[redacted]d  admitted for active labor  Subjective: Pt breathing through UCs and c/o rectal pressure and urge to push with UCs.  Requesting IV pain med.  Last Stadol approx 1hr ago.  Objective: BP 108/65  Pulse 94  Temp(Src) 98.1 F (36.7 C) (Oral)  Resp 18  Ht 5\' 5"  (1.651 m)  Wt 109.77 kg (242 lb)  BMI 40.27 kg/m2  LMP 11/21/2012     FHT:  FHR: 135 bpm, variability: moderate,  accelerations:  Present,  decelerations:  Present Early UC:   regular, every 2-4 minutes SVE:   Dilation: 6 Effacement (%): 80 Station: -2 Exam by:: [employer] CNM  Labs: Lab Results  Component Value Date   WBC 10.6* 08/24/2013   HGB 11.0* 08/24/2013   HCT 33.6* 08/24/2013   MCV 77.8* 08/24/2013   PLT 382 08/24/2013    Assessment / Plan: IUP at 39w 3d Active labor  Labor: Progressing normally Preeclampsia:  no signs or symptoms of toxicity Fetal Wellbeing:  Category II Pain Control:  IV Stadol I/D:  GBS pos/Afebrile/PCN G Anticipated MOD:  NSVD  Positioned in lt lat/exag Sims to facilitate rotation and descent.  Will repeat Stadol at present.  [place] O. 08/24/2013, 7:39 AM

## 2013-08-24 NOTE — H&P (Signed)
Phyllis Dalton is a 23 y.o. female, G2P1001 at 14 3/7 weeks, presenting with increasing contractions since 11:45pm.  Denies leaking or bleeding, reports + FM.  Cervix has been 4 cm, 50% on most recent exam 08/22/13--patient has had several MAU visits over the last week for false labor.  Patient Active Problem List   Diagnosis Date Noted  . Infection due to trichomonas--recurrent during pregnancy 08/24/2013  . GBS (group B Streptococcus carrier), +RV culture, currently pregnant 08/17/2013  . Chlamydia infection complicating pregnancy 02/4127, negative TOC 08/17/2013  . Major depressive disorder, single episode, severe, without mention of psychotic behavior--hospitalized during pregnancy at [facility] 03/21/2013  . Panic attack   Expressed suicidal ideation in July, leading to hospitalization at [facility] for 5 days.  History of present pregnancy: Patient entered care at 10 weeks.   EDC of 08/28/13 was established by LMP and early [place] at [facility],.   Anatomy scan:  18 weeks, with normal findings and an posterior placenta.   Additional [place] evaluations:   20 weeks--to complete the [place] for anatomy 30 weeks--Growth WNL,  Significant prenatal events:  Trichomonas 6/13, 8/14, 9/18.  Hospitalized at [facility] at 17 week due to a severe depression. Last evaluation:  08/19/13 in the office, 08/22/12 in MAU--cervix 4 cm during the exam.  Received TDaP and flu vaccine. Pre albumin on 10/15--done due to poor weight in mother = WNL.  OB History   Grav Para Term Preterm Abortions TAB SAB Ect Mult Living   2 1 1  0 0 0 0 0 0 1    2010--SVB, term, 7 lbs, female  Past Medical History  Diagnosis Date  . Panic attack   . Obesity   . Depression   . Trichomonal vaginitis    Past Surgical History  Procedure Laterality Date  . Mouth surgery    . Tonsillectomy    . Addenoidectomy     Family History: family history includes Diabetes in her mother and other; HIV in her mother.  Social History:  reports that she quit smoking about 7  months ago. Her smoking use included Cigarettes. She smoked 0.00 packs per day. She has never used smokeless tobacco. She reports that she uses illicit drugs (Marijuana). She reports that she does not drink alcohol.   Prenatal Transfer Tool  Maternal Diabetes: No Genetic Screening: Normal Maternal Ultrasounds/Referrals: Normal Fetal Ultrasounds or other Referrals:  None Maternal Substance Abuse:  No Significant Maternal Medications:  None Significant Maternal Lab Results: Lab values include: Group B Strep positive    ROS:  Contractions, +FM  No Known Allergies   Dilation: 5.5 Effacement (%): 50 Station: -2 Exam by:: V.Latham,CNM Last menstrual period 11/21/2012. Cervix posterior BBOW  Chest clear Heart RRR without murmur Abd gravid, NT, FH 39 cm Pelvic: see above Ext: DTR 2+ without murmur   FHR: Category 1 UCs:  Very occasional  Prenatal labs: ABO, Rh: O/Positive/-- (06/03 0000) Antibody: Negative (06/03 0000) Rubella:   Immune RPR: Nonreactive (06/03 0000)  HBsAg: Negative (06/03 0000)  HIV: Non-reactive (06/03 0000)  GBS: Positive (12/12 0000) Sickle cell/Hgb electrophoresis:  NA Pap:  WNL 05/2012. GC: Negative at NOB and at 36 weeks Chlamydia:  Negative at NOB and at 36 weeks Pre-albumin WNL Genetic screenings:  Normal Quad screen Glucola:  WNL Other:  TDaP and flu vaccine given 10/15.       Assessment/Plan: IUP at 39 3/7 weeks Active labor GBS positive Hx depression/SI in July--stable now, no meds.  Plan: Admit to [facility]  per consult with Dr. Mancel Bale Routine [facility] orders GBS prophylaxis with PCN G per standard dosing. Patient definitely declines epidural, but planning IV med for pain. SW consult after delivery for psych hx.  [place], [place], [place] 08/24/2013, 3:02 AM

## 2013-08-24 NOTE — Progress Notes (Signed)
  Subjective: Sleeping at intervals.  Aware of contractions when awake, but not when asleep.  Objective: BP 104/65  Pulse 98  Temp(Src) 98.3 F (36.8 C) (Oral)  Resp 18  Ht 5\' 5"  (1.651 m)  Wt 242 lb (109.77 kg)  BMI 40.27 kg/m2  LMP 11/21/2012    Completed 1st dose of PCN  FHT:  Category 1 UC:   regular, every 5 minutes SVE:   Dilation: 6 Effacement (%): 50 Station: -2 Exam by:: Phyllis Dalton CNM Cervix very stretchy, but still posterior. BBOW with UC--AROM, clear fluid  Assessment / Plan: Early labor GBS positive Observe status of labor s/p AROM--augment prn.   Donnel Saxon 08/24/2013, 5:46 AM

## 2013-08-25 ENCOUNTER — Encounter (HOSPITAL_COMMUNITY): Payer: Self-pay | Admitting: *Deleted

## 2013-08-25 LAB — CBC
HCT: 31.1 % — ABNORMAL LOW (ref 36.0–46.0)
Hemoglobin: 10.2 g/dL — ABNORMAL LOW (ref 12.0–15.0)
MCH: 25.8 pg — ABNORMAL LOW (ref 26.0–34.0)
MCHC: 32.8 g/dL (ref 30.0–36.0)
MCV: 78.5 fL (ref 78.0–100.0)
Platelets: 372 10*3/uL (ref 150–400)
RBC: 3.96 MIL/uL (ref 3.87–5.11)
RDW: 13.7 % (ref 11.5–15.5)
WBC: 12.7 10*3/uL — ABNORMAL HIGH (ref 4.0–10.5)

## 2013-08-25 NOTE — Progress Notes (Signed)
Clinical Social Work Department PSYCHOSOCIAL ASSESSMENT - MATERNAL/CHILD 08/25/2013  Patient:  Phyllis Dalton, Phyllis Dalton  Account Number:  [id number]  Admit Date:  08/24/2013  [name] Name:   Phyllis Dalton    Clinical Social Worker:  CUMI BEVEL, LCSW   Date/Time:  08/25/2013 04:00 PM  Date Referred:  08/25/2013   Referral source  [facility]     Referred reason  Substance Abuse  Depression/Anxiety   Other referral source:    I:  FAMILY / [place] legal guardian:  PARENT  Guardian - Name Guardian - Age Phyllis Dalton. [place],  [id number]  Phyllis Dalton 26    Other household support members/support persons Other support:    II  PSYCHOSOCIAL DATA Information Source:  Patient Interview  Occupational hygienist Employment:   Museum/gallery curator resources:  [employer] [place] / Grade:   Maternity Care Coordinator / Child Services Coordination / Early Interventions:  Cultural issues impacting care:    III  STRENGTHS Strengths  Supportive family/friends  Home prepared for Child (including basic supplies)  Adequate Resources   Strength comment:    IV  RISK FACTORS AND CURRENT PROBLEMS Current Problem:       V  SOCIAL WORK ASSESSMENT Acknowledged order for Social Work consult to assess mother's history of depression and marijuana use.   Mother was receptive to social work intervention.  She is a single parent with one other dependent age 36.  Mother reports hx of depression.  Informed that she was under significant stress during the pregnancy and was hospitalized 3 days for suicide Ideation August 2014.   She denies any other hx of psychiatric treatment.  Mother states that she was referred for outpatient services but didn't follow through because she felt much better.  She denies any SI, HI, or current symptoms of depression or anxiety.    Discussed signs/symptoms of PP depression with mother.  Provided her with literature and treatment resources if needed.  Mother admits to occasional use of marijuana during pregnancy. Informed that she was unable to keep anything down during the first trimester and the medication being prescribe was not working.  She admit to use of marijuana about 3 times a week and states her medication for the nausea and vomiting was switched  she was then able to keep food down and no longer needed the marijuana. Last reported use 05/2013. Informed her of the hospital's drug screen policy.  UDS on newborn was negative.  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      VI SOCIAL WORK PLAN Social Work Plan  No Barriers to Discharge   Type of pt/family education:   If child protective services report - county:   If child protective services report - date:   Information/referral to community resources comment:   Other social work plan:   CSW will monitor drug screen.

## 2013-08-25 NOTE — Progress Notes (Signed)
Post Partum Day 1:S/P SVB, 1st degree vaginal laceration Subjective: Patient up ad lib, denies syncope or dizziness. Feeding:  Breast Contraceptive plan:   Nexplanon  Objective: Blood pressure 111/75, pulse 71, temperature 97.9 F (36.6 C), temperature source Oral, resp. rate 18, height 5\' 5"  (1.651 m), weight 242 lb (109.77 kg), last menstrual period 11/21/2012, unknown if currently breastfeeding.  Physical Exam:  General: alert Lochia: appropriate Uterine Fundus: firm Incision: Healing well DVT Evaluation: No evidence of DVT seen on physical exam. Negative Homan's sign.   Recent Labs  08/24/13 0240 08/25/13 0600  HGB 11.0* 10.2*  HCT 33.6* 31.1*    Assessment/Plan: S/P Vaginal delivery day 1 Continue current care Plan for discharge tomorrow Planning outpatient circumcision.   LOS: 1 day   LATHAM, VICKI 08/25/2013, 11:16 AM

## 2013-08-26 MED ORDER — OXYCODONE-ACETAMINOPHEN 5-325 MG PO TABS: 1.0000 | ORAL_TABLET | ORAL | Status: DC | PRN

## 2013-08-26 MED ORDER — IBUPROFEN 600 MG PO TABS: 600.0000 mg | ORAL_TABLET | Freq: Four times a day (QID) | ORAL | Status: DC

## 2013-08-26 NOTE — Progress Notes (Signed)
UR chart review completed.  

## 2013-08-26 NOTE — Discharge Summary (Signed)
Vaginal Delivery Discharge Summary  Phyllis Dalton  DOB:    1990-12-19 MRN:    [id number] CSN:    [id number]  Date of admission:                  08/24/2012  Date of discharge:                   08/26/2012  Procedures this admission: SVD   Date of Delivery: 08/24/2012  Newborn Data:  Live born female  Birth Weight: 7 lb 8.3 oz (3410 g) APGAR: 8, 9  Home with mother.  Circumcision Plan: outpatient   History of Present Illness:  Ms. Phyllis Dalton is a 23 y.o. female, G2P2002, who presents at [redacted]w[redacted]d weeks gestation. The patient has been followed at the [facility] and Gynecology division of [facility]. She was admitted onset of labor. Her pregnancy has been complicated by: none.  Hospital course:  The patient was admitted for .   Her labor was not complicated. She proceeded to have a vaginal delivery of a healthy infant. Her delivery was not complicated. Her postpartum course was not complicated.  She was discharged to home on postpartum day 2 doing well.  Feeding:  breast  Contraception:  nexplanon   Discharge hemoglobin:  Hemoglobin  Date Value Range Status  08/25/2013 10.2* 12.0 - 15.0 g/dL Final     HCT  Date Value Range Status  08/25/2013 31.1* 36.0 - 46.0 % Final    Discharge Physical Exam:   General: alert and no distress Lochia: appropriate Uterine Fundus: firm Incision: healing well DVT Evaluation: No evidence of DVT seen on physical exam. Negative Homan's sign.  Intrapartum Procedures: spontaneous vaginal delivery Postpartum Procedures: none Complications-Operative and Postpartum: none  Discharge Diagnoses: Term Pregnancy-delivered  Discharge Information:  Activity:           pelvic rest Diet:                routine Medications: PNV Condition:      stable Instructions:   Postpartum Care After Vaginal Delivery  After you deliver your newborn (postpartum period), the usual stay in the hospital is 24 72  hours. If there were problems with your labor or delivery, or if you have other medical problems, you might be in the hospital longer.  While you are in the hospital, you will receive help and instructions on how to care for yourself and your newborn during the postpartum period.  While you are in the hospital:  Be sure to tell your nurses if you have pain or discomfort, as well as where you feel the pain and what makes the pain worse.  If you had an incision made near your vagina (episiotomy) or if you had some tearing during delivery, the nurses may put ice packs on your episiotomy or tear. The ice packs may help to reduce the pain and swelling.  If you are breastfeeding, you may feel uncomfortable contractions of your uterus for a couple of weeks. This is normal. The contractions help your uterus get back to normal size.  It is normal to have some bleeding after delivery.  For the first 1 3 days after delivery, the flow is red and the amount may be similar to a period.  It is common for the flow to start and stop.  In the first few days, you may pass some small clots. Let your nurses know if you begin to pass large clots  or your flow increases.  Do not  flush blood clots down the toilet before having the nurse look at them.  During the next 3 10 days after delivery, your flow should become more watery and pink or brown-tinged in color.  Ten to fourteen days after delivery, your flow should be a small amount of yellowish-white discharge.  The amount of your flow will decrease over the first few weeks after delivery. Your flow may stop in 6 8 weeks. Most women have had their flow stop by 12 weeks after delivery.  You should change your sanitary pads frequently.  Wash your hands thoroughly with soap and water for at least 20 seconds after changing pads, using the toilet, or before holding or feeding your newborn.  You should feel like you need to empty your bladder within the first 6 8  hours after delivery.  In case you become weak, lightheaded, or faint, call your nurse before you get out of bed for the first time and before you take a shower for the first time.  Within the first few days after delivery, your breasts may begin to feel tender and full. This is called engorgement. Breast tenderness usually goes away within 48 72 hours after engorgement occurs. You may also notice milk leaking from your breasts. If you are not breastfeeding, do not stimulate your breasts. Breast stimulation can make your breasts produce more milk.  Spending as much time as possible with your newborn is very important. During this time, you and your newborn can feel close and get to know each other. Having your newborn stay in your room (rooming in) will help to strengthen the bond with your newborn. It will give you time to get to know your newborn and become comfortable caring for your newborn.  Your hormones change after delivery. Sometimes the hormone changes can temporarily cause you to feel sad or tearful. These feelings should not last more than a few days. If these feelings last longer than that, you should talk to your caregiver.  If desired, talk to your caregiver about methods of family planning or contraception.  Talk to your caregiver about immunizations. Your caregiver may want you to have the following immunizations before leaving the hospital:  Tetanus, diphtheria, and pertussis (Tdap) or tetanus and diphtheria (Td) immunization. It is very important that you and your family (including grandparents) or others caring for your newborn are up-to-date with the Tdap or Td immunizations. The Tdap or Td immunization can help protect your newborn from getting ill.  Rubella immunization.  Varicella (chickenpox) immunization.  Influenza immunization. You should receive this annual immunization if you did not receive the immunization during your pregnancy. Document Released: 06/05/2007  Document Revised: 05/02/2012 Document Reviewed: 04/04/2012 [facility] Patient Information 2014 [place].   Postpartum Depression and Baby Blues  The postpartum period begins right after the birth of a baby. During this time, there is often a great amount of joy and excitement. It is also a time of considerable changes in the life of the parent(s). Regardless of how many times a mother gives birth, each child brings new challenges and dynamics to the family. It is not unusual to have feelings of excitement accompanied by confusing shifts in moods, emotions, and thoughts. All mothers are at risk of developing postpartum depression or the "baby blues." These mood changes can occur right after giving birth, or they may occur many months after giving birth. The baby blues or postpartum depression can be mild or  severe. Additionally, postpartum depression can resolve rather quickly, or it can be a long-term condition. CAUSES Elevated hormones and their rapid decline are thought to be a main cause of postpartum depression and the baby blues. There are a number of hormones that radically change during and after pregnancy. Estrogen and progesterone usually decrease immediately after delivering your baby. The level of thyroid hormone and various cortisol steroids also rapidly drop. Other factors that play a major role in these changes include major life events and genetics.  RISK FACTORS If you have any of the following risks for the baby blues or postpartum depression, know what symptoms to watch out for during the postpartum period. Risk factors that may increase the likelihood of getting the baby blues or postpartum depression include:  Havinga personal or family history of depression.  Having depression while being pregnant.  Having premenstrual or oral contraceptive-associated mood issues.  Having exceptional life stress.  Having marital conflict.  Lacking a social support network.  Having a  baby with special needs.  Having health problems such as diabetes. SYMPTOMS Baby blues symptoms include:  Brief fluctuations in mood, such as going from extreme happiness to sadness.  Decreased concentration.  Difficulty sleeping.  Crying spells, tearfulness.  Irritability.  Anxiety. Postpartum depression symptoms typically begin within the first month after giving birth. These symptoms include:  Difficulty sleeping or excessive sleepiness.  Marked weight loss.  Agitation.  Feelings of worthlessness.  Lack of interest in activity or food. Postpartum psychosis is a very concerning condition and can be dangerous. Fortunately, it is rare. Displaying any of the following symptoms is cause for immediate medical attention. Postpartum psychosis symptoms include:  Hallucinations and delusions.  Bizarre or disorganized behavior.  Confusion or disorientation. DIAGNOSIS  A diagnosis is made by an evaluation of your symptoms. There are no medical or lab tests that lead to a diagnosis, but there are various questionnaires that a caregiver may use to identify those with the baby blues, postpartum depression, or psychosis. Often times, a screening tool called the [place] Postnatal Depression Scale is used to diagnose depression in the postpartum period.  TREATMENT The baby blues usually goes away on its own in 1 to 2 weeks. Social support is often all that is needed. You should be encouraged to get adequate sleep and rest. Occasionally, you may be given medicines to help you sleep.  Postpartum depression requires treatment as it can last several months or longer if it is not treated. Treatment may include individual or group therapy, medicine, or both to address any social, physiological, and psychological factors that may play a role in the depression. Regular exercise, a healthy diet, rest, and social support may also be strongly recommended.  Postpartum psychosis is more serious and  needs treatment right away. Hospitalization is often needed. HOME CARE INSTRUCTIONS  Get as much rest as you can. Nap when the baby sleeps.  Exercise regularly. Some women find yoga and walking to be beneficial.  Eat a balanced and nourishing diet.  Do little things that you enjoy. Have a cup of tea, take a bubble bath, read your favorite magazine, or listen to your favorite music.  Avoid alcohol.  Ask for help with household chores, cooking, grocery shopping, or running errands as needed. Do not try to do everything.  Talk to people close to you about how you are feeling. Get support from your partner, family members, friends, or other new moms.  Try to stay positive in how you think.  Think about the things you are grateful for.  Do not spend a lot of time alone.  Only take medicine as directed by your caregiver.  Keep all your postpartum appointments.  Let your caregiver know if you have any concerns. SEEK MEDICAL CARE IF: You are having a reaction or problems with your medicine. SEEK IMMEDIATE MEDICAL CARE IF:  You have suicidal feelings.  You feel you may harm the baby or someone else. Document Released: 05/12/2004 Document Revised: 10/31/2011 Document Reviewed: 06/14/2011 [facility] Patient Information 2014 [place], [place].   Discharge to: home  Follow-up Information   Follow up with [facility] In 5 weeks.   Specialty:  Obstetrics and Gynecology   Contact information:   [address] [place]  [address] [phone]       Charlane Ferretti 08/26/2013

## 2013-08-26 NOTE — Discharge Instructions (Signed)
Vaginal Delivery Care After Refer to this sheet in the next few weeks. These discharge instructions provide you with information on caring for yourself after delivery. Your caregiver may also give you specific instructions. Your treatment has been planned according to the most current medical practices available, but problems sometimes occur. Call your caregiver if you have any problems or questions after you go home. HOME CARE INSTRUCTIONS  Take over-the-counter or prescription medicines only as directed by your caregiver or pharmacist.  Do not drink alcohol, especially if you are breastfeeding or taking medicine to relieve pain.  Do not chew or smoke tobacco.  Do not use illegal drugs.  Continue to use good perineal care. Good perineal care includes:  Wiping your perineum from front to back.  Keeping your perineum clean.  Do not use tampons or douche until your caregiver says it is okay.  Shower, wash your hair, and take tub baths as directed by your caregiver.  Wear a well-fitting bra that provides breast support.  Eat healthy foods.  Drink enough fluids to keep your urine clear or pale yellow.  Eat high-fiber foods such as whole grain cereals and breads, brown rice, beans, and fresh fruits and vegetables every day. These foods may help prevent or relieve constipation.  Follow your cargiver's recommendations regarding resumption of activities such as climbing stairs, driving, lifting, exercising, or traveling.  Talk to your caregiver about resuming sexual activities. Resumption of sexual activities is dependent upon your risk of infection, your rate of healing, and your comfort and desire to resume sexual activity.  Try to have someone help you with your household activities and your newborn for at least a few days after you leave the hospital.  Rest as much as possible. Try to rest or take a nap when your newborn is sleeping.  Increase your activities gradually.  Keep all  of your scheduled postpartum appointments. It is very important to keep your scheduled follow-up appointments. At these appointments, your caregiver will be checking to make sure that you are healing physically and emotionally. SEEK MEDICAL CARE IF:   You are passing large clots from your vagina. Save any clots to show your caregiver.  You have a foul smelling discharge from your vagina.  You have trouble urinating.  You are urinating frequently.  You have pain when you urinate.  You have a change in your bowel movements.  You have increasing redness, pain, or swelling near your vaginal incision (episiotomy) or vaginal tear.  You have pus draining from your episiotomy or vaginal tear.  Your episiotomy or vaginal tear is separating.  You have painful, hard, or reddened breasts.  You have a severe headache.  You have blurred vision or see spots.  You feel sad or depressed.  You have thoughts of hurting yourself or your newborn.  You have questions about your care, the care of your newborn, or medicines.  You are dizzy or lightheaded.  You have a rash.  You have nausea or vomiting.  You were breastfeeding and have not had a menstrual period within 12 weeks after you stopped breastfeeding.  You are not breastfeeding and have not had a menstrual period by the 12th week after delivery.  You have a fever. SEEK IMMEDIATE MEDICAL CARE IF:   You have persistent pain.  You have chest pain.  You have shortness of breath.  You faint.  You have leg pain.  You have stomach pain.  Your vaginal bleeding saturates two or more sanitary pads  in 1 hour. MAKE SURE YOU:   Understand these instructions.  Will watch your condition.  Will get help right away if you are not doing well or get worse. Document Released: 08/05/2000 Document Revised: 05/02/2012 Document Reviewed: 04/04/2012 [facility] Patient Information 2014 [place].  Breastfeeding Deciding to breastfeed  is one of the best choices you can make for you and your baby. A change in hormones during pregnancy causes your breast tissue to grow and increases the number and size of your milk ducts. These hormones also allow proteins, sugars, and fats from your blood supply to make breast milk in your milk-producing glands. Hormones prevent breast milk from being released before your baby is born as well as prompt milk flow after birth. Once breastfeeding has begun, thoughts of your baby, as well as his or her sucking or crying, can stimulate the release of milk from your milk-producing glands.  BENEFITS OF BREASTFEEDING For Your Baby  Your first milk (colostrum) helps your baby's digestive system function better.   There are antibodies in your milk that help your baby fight off infections.   Your baby has a lower incidence of asthma, allergies, and sudden infant death syndrome.   The nutrients in breast milk are better for your baby than infant formulas and are designed uniquely for your baby's needs.   Breast milk improves your baby's brain development.   Your baby is less likely to develop other conditions, such as childhood obesity, asthma, or type 2 diabetes mellitus.  For You   Breastfeeding helps to create a very special bond between you and your baby.   Breastfeeding is convenient. Breast milk is always available at the correct temperature and costs nothing.   Breastfeeding helps to burn calories and helps you lose the weight gained during pregnancy.   Breastfeeding makes your uterus contract to its prepregnancy size faster and slows bleeding (lochia) after you give birth.   Breastfeeding helps to lower your risk of developing type 2 diabetes mellitus, osteoporosis, and breast or ovarian cancer later in life. SIGNS THAT YOUR BABY IS HUNGRY Early Signs of Hunger  Increased alertness or activity.  Stretching.  Movement of the head from side to side.  Movement of the head and  opening of the mouth when the corner of the mouth or cheek is stroked (rooting).  Increased sucking sounds, smacking lips, cooing, sighing, or squeaking.  Hand-to-mouth movements.  Increased sucking of fingers or hands. Late Signs of Hunger  Fussing.  Intermittent crying. Extreme Signs of Hunger Signs of extreme hunger will require calming and consoling before your baby will be able to breastfeed successfully. Do not wait for the following signs of extreme hunger to occur before you initiate breastfeeding:   Restlessness.  A loud, strong cry.   Screaming. BREASTFEEDING BASICS Breastfeeding Initiation  Find a comfortable place to sit or lie down, with your neck and back well supported.  Place a pillow or rolled up blanket under your baby to bring him or her to the level of your breast (if you are seated). Nursing pillows are specially designed to help support your arms and your baby while you breastfeed.  Make sure that your baby's abdomen is facing your abdomen.   Gently massage your breast. With your fingertips, massage from your chest wall toward your nipple in a circular motion. This encourages milk flow. You may need to continue this action during the feeding if your milk flows slowly.  Support your breast with 4 fingers underneath  and your thumb above your nipple. Make sure your fingers are well away from your nipple and your baby's mouth.   Stroke your baby's lips gently with your finger or nipple.   When your baby's mouth is open wide enough, quickly bring your baby to your breast, placing your entire nipple and as much of the colored area around your nipple (areola) as possible into your baby's mouth.   More areola should be visible above your baby's upper lip than below the lower lip.   Your baby's tongue should be between his or her lower gum and your breast.   Ensure that your baby's mouth is correctly positioned around your nipple (latched). Your baby's  lips should create a seal on your breast and be turned out (everted).  It is common for your baby to suck about 2 3 minutes in order to start the flow of breast milk. Latching Teaching your baby how to latch on to your breast properly is very important. An improper latch can cause nipple pain and decreased milk supply for you and poor weight gain in your baby. Also, if your baby is not latched onto your nipple properly, he or she may swallow some air during feeding. This can make your baby fussy. Burping your baby when you switch breasts during the feeding can help to get rid of the air. However, teaching your baby to latch on properly is still the best way to prevent fussiness from swallowing air while breastfeeding. Signs that your baby has successfully latched on to your nipple:    Silent tugging or silent sucking, without causing you pain.   Swallowing heard between every 3 4 sucks.    Muscle movement above and in front of his or her ears while sucking.  Signs that your baby has not successfully latched on to nipple:   Sucking sounds or smacking sounds from your baby while breastfeeding.  Nipple pain. If you think your baby has not latched on correctly, slip your finger into the corner of your baby's mouth to break the suction and place it between your baby's gums. Attempt breastfeeding initiation again. Signs of Successful Breastfeeding Signs from your baby:   A gradual decrease in the number of sucks or complete cessation of sucking.   Falling asleep.   Relaxation of his or her body.   Retention of a small amount of milk in his or her mouth.   Letting go of your breast by himself or herself. Signs from you:  Breasts that have increased in firmness, weight, and size 1 3 hours after feeding.   Breasts that are softer immediately after breastfeeding.  Increased milk volume, as well as a change in milk consistency and color by the 5th day of breastfeeding.   Nipples  that are not sore, cracked, or bleeding. Signs That Your [employer] is Getting Enough Milk  Wetting at least 3 diapers in a 24-hour period. The urine should be clear and pale yellow by age 63 days.  At least 3 stools in a 24-hour period by age 63 days. The stool should be soft and yellow.  At least 3 stools in a 24-hour period by age 74 days. The stool should be seedy and yellow.  No loss of weight greater than 10% of birth weight during the first 48 days of age.  Average weight gain of 4 7 ounces (120 210 mL) per week after age 82 days.  Consistent daily weight gain by age 63 days, without weight loss  after the age of 2 weeks. After a feeding, your baby may spit up a small amount. This is common. BREASTFEEDING FREQUENCY AND DURATION Frequent feeding will help you make more milk and can prevent sore nipples and breast engorgement. Breastfeed when you feel the need to reduce the fullness of your breasts or when your baby shows signs of hunger. This is called "breastfeeding on demand." Avoid introducing a pacifier to your baby while you are working to establish breastfeeding (the first 4 6 weeks after your baby is born). After this time you may choose to use a pacifier. Research has shown that pacifier use during the first year of a baby's life decreases the risk of sudden infant death syndrome (SIDS). Allow your baby to feed on each breast as long as he or she wants. Breastfeed until your baby is finished feeding. When your baby unlatches or falls asleep while feeding from the first breast, offer the second breast. Because newborns are often sleepy in the first few weeks of life, you may need to awaken your baby to get him or her to feed. Breastfeeding times will vary from baby to baby. However, the following rules can serve as a guide to help you ensure that your baby is properly fed:  Newborns (babies 33 weeks of age or younger) may breastfeed every 1 3 hours.  Newborns should not go longer than 3 hours  during the day or 5 hours during the night without breastfeeding.  You should breastfeed your baby a minimum of 8 times in a 24-hour period until you begin to introduce solid foods to your baby at around 30 months of age. BREAST MILK PUMPING Pumping and storing breast milk allows you to ensure that your baby is exclusively fed your breast milk, even at times when you are unable to breastfeed. This is especially important if you are going back to work while you are still breastfeeding or when you are not able to be present during feedings. Your lactation consultant can give you guidelines on how long it is safe to store breast milk.  A breast pump is a machine that allows you to pump milk from your breast into a sterile bottle. The pumped breast milk can then be stored in a refrigerator or freezer. Some breast pumps are operated by hand, while others use electricity. Ask your lactation consultant which type will work best for you. Breast pumps can be purchased, but some hospitals and breastfeeding support groups lease breast pumps on a monthly basis. A lactation consultant can teach you how to hand express breast milk, if you prefer not to use a pump.  CARING FOR YOUR BREASTS WHILE YOU BREASTFEED Nipples can become dry, cracked, and sore while breastfeeding. The following recommendations can help keep your breasts moisturized and healthy:  Avoid using soap on your nipples.   Wear a supportive bra. Although not required, special nursing bras and tank tops are designed to allow access to your breasts for breastfeeding without taking off your entire bra or top. Avoid wearing underwire style bras or extremely tight bras.  Air dry your nipples for 3 81minutes after each feeding.   Use only cotton bra pads to absorb leaked breast milk. Leaking of breast milk between feedings is normal.   Use lanolin on your nipples after breastfeeding. Lanolin helps to maintain your skin's normal moisture barrier. If you  use pure lanolin you do not need to wash it off before feeding your baby again. Pure lanolin is not  toxic to your baby. You may also hand express a few drops of breast milk and gently massage that milk into your nipples and allow the milk to air dry. In the first few weeks after giving birth, some women experience extremely full breasts (engorgement). Engorgement can make your breasts feel heavy, warm, and tender to the touch. Engorgement peaks within 3 5 days after you give birth. The following recommendations can help ease engorgement:  Completely empty your breasts while breastfeeding or pumping. You may want to start by applying warm, moist heat (in the shower or with warm water-soaked hand towels) just before feeding or pumping. This increases circulation and helps the milk flow. If your baby does not completely empty your breasts while breastfeeding, pump any extra milk after he or she is finished.  Wear a snug bra (nursing or regular) or tank top for 1 2 days to signal your body to slightly decrease milk production.  Apply ice packs to your breasts, unless this is too uncomfortable for you.  Make sure that your baby is latched on and positioned properly while breastfeeding. If engorgement persists after 48 hours of following these recommendations, contact your health care provider or a Science writer. OVERALL HEALTH CARE RECOMMENDATIONS WHILE BREASTFEEDING  Eat healthy foods. Alternate between meals and snacks, eating 3 of each per day. Because what you eat affects your breast milk, some of the foods may make your baby more irritable than usual. Avoid eating these foods if you are sure that they are negatively affecting your baby.  Drink milk, fruit juice, and water to satisfy your thirst (about 10 glasses a day).   Rest often, relax, and continue to take your prenatal vitamins to prevent fatigue, stress, and anemia.  Continue breast self-awareness checks.  Avoid chewing and  smoking tobacco.  Avoid alcohol and drug use. Some medicines that may be harmful to your baby can pass through breast milk. It is important to ask your health care provider before taking any medicine, including all over-the-counter and prescription medicine as well as vitamin and herbal supplements. It is possible to become pregnant while breastfeeding. If birth control is desired, ask your health care provider about options that will be safe for your baby. SEEK MEDICAL CARE IF:   You feel like you want to stop breastfeeding or have become frustrated with breastfeeding.  You have painful breasts or nipples.  Your nipples are cracked or bleeding.  Your breasts are red, tender, or warm.  You have a swollen area on either breast.  You have a fever or chills.  You have nausea or vomiting.  You have drainage other than breast milk from your nipples.  Your breasts do not become full before feedings by the 5th day after you give birth.  You feel sad and depressed.  Your baby is too sleepy to eat well.  Your baby is having trouble sleeping.   Your baby is wetting less than 3 diapers in a 24-hour period.  Your baby has less than 3 stools in a 24-hour period.  Your baby's skin or the white part of his or her eyes becomes yellow.   Your baby is not gaining weight by 16 days of age. SEEK IMMEDIATE MEDICAL CARE IF:   Your baby is overly tired (lethargic) and does not want to wake up and feed.  Your baby develops an unexplained fever. Document Released: 08/08/2005 Document Revised: 04/10/2013 Document Reviewed: 01/30/2013 [facility] Patient Information 2014 [place].  Postpartum Depression and Baby  Blues The postpartum period begins right after the birth of a baby. During this time, there is often a great amount of joy and excitement. It is also a time of considerable changes in the life of the parent(s). Regardless of how many times a mother gives birth, each child brings new  challenges and dynamics to the family. It is not unusual to have feelings of excitement accompanied by confusing shifts in moods, emotions, and thoughts. All mothers are at risk of developing postpartum depression or the "baby blues." These mood changes can occur right after giving birth, or they may occur many months after giving birth. The baby blues or postpartum depression can be mild or severe. Additionally, postpartum depression can resolve rather quickly, or it can be a long-term condition. CAUSES Elevated hormones and their rapid decline are thought to be a main cause of postpartum depression and the baby blues. There are a number of hormones that radically change during and after pregnancy. Estrogen and progesterone usually decrease immediately after delivering your baby. The level of thyroid hormone and various cortisol steroids also rapidly drop. Other factors that play a major role in these changes include major life events and genetics.  RISK FACTORS If you have any of the following risks for the baby blues or postpartum depression, know what symptoms to watch out for during the postpartum period. Risk factors that may increase the likelihood of getting the baby blues or postpartum depression include:  Havinga personal or family history of depression.  Having depression while being pregnant.  Having premenstrual or oral contraceptive-associated mood issues.  Having exceptional life stress.  Having marital conflict.  Lacking a social support network.  Having a baby with special needs.  Having health problems such as diabetes. SYMPTOMS Baby blues symptoms include:  Brief fluctuations in mood, such as going from extreme happiness to sadness.  Decreased concentration.  Difficulty sleeping.  Crying spells, tearfulness.  Irritability.  Anxiety. Postpartum depression symptoms typically begin within the first month after giving birth. These symptoms include:  Difficulty  sleeping or excessive sleepiness.  Marked weight loss.  Agitation.  Feelings of worthlessness.  Lack of interest in activity or food. Postpartum psychosis is a very concerning condition and can be dangerous. Fortunately, it is rare. Displaying any of the following symptoms is cause for immediate medical attention. Postpartum psychosis symptoms include:  Hallucinations and delusions.  Bizarre or disorganized behavior.  Confusion or disorientation. DIAGNOSIS  A diagnosis is made by an evaluation of your symptoms. There are no medical or lab tests that lead to a diagnosis, but there are various questionnaires that a caregiver may use to identify those with the baby blues, postpartum depression, or psychosis. Often times, a screening tool called the [place] Postnatal Depression Scale is used to diagnose depression in the postpartum period.  TREATMENT The baby blues usually goes away on its own in 1 to 2 weeks. Social support is often all that is needed. You should be encouraged to get adequate sleep and rest. Occasionally, you may be given medicines to help you sleep.  Postpartum depression requires treatment as it can last several months or longer if it is not treated. Treatment may include individual or group therapy, medicine, or both to address any social, physiological, and psychological factors that may play a role in the depression. Regular exercise, a healthy diet, rest, and social support may also be strongly recommended.  Postpartum psychosis is more serious and needs treatment right away. Hospitalization is often needed. HOME  CARE INSTRUCTIONS  Get as much rest as you can. Nap when the baby sleeps.  Exercise regularly. Some women find yoga and walking to be beneficial.  Eat a balanced and nourishing diet.  Do little things that you enjoy. Have a cup of tea, take a bubble bath, read your favorite magazine, or listen to your favorite music.  Avoid alcohol.  Ask for help with  household chores, cooking, grocery shopping, or running errands as needed. Do not try to do everything.  Talk to people close to you about how you are feeling. Get support from your partner, family members, friends, or other new moms.  Try to stay positive in how you think. Think about the things you are grateful for.  Do not spend a lot of time alone.  Only take medicine as directed by your caregiver.  Keep all your postpartum appointments.  Let your caregiver know if you have any concerns. SEEK MEDICAL CARE IF: You are having a reaction or problems with your medicine. SEEK IMMEDIATE MEDICAL CARE IF:  You have suicidal feelings.  You feel you may harm the baby or someone else. Document Released: 05/12/2004 Document Revised: 10/31/2011 Document Reviewed: 06/14/2011 [facility] Patient Information 2014 [place], [place].

## 2013-11-14 ENCOUNTER — Encounter (HOSPITAL_COMMUNITY): Payer: Self-pay | Admitting: Emergency Medicine

## 2013-11-14 ENCOUNTER — Emergency Department (HOSPITAL_COMMUNITY): Payer: Medicaid Other

## 2013-11-14 ENCOUNTER — Emergency Department (HOSPITAL_COMMUNITY)
Admission: EM | Admit: 2013-11-14 | Discharge: 2013-11-14 | Disposition: A | Discharge status: Home / Self Care | Payer: Medicaid Other | Attending: Emergency Medicine | Admitting: Emergency Medicine

## 2013-11-14 DIAGNOSIS — J069 Acute upper respiratory infection, unspecified: Secondary | ICD-10-CM | POA: Insufficient documentation

## 2013-11-14 DIAGNOSIS — Z9089 Acquired absence of other organs: Secondary | ICD-10-CM | POA: Insufficient documentation

## 2013-11-14 DIAGNOSIS — Z8659 Personal history of other mental and behavioral disorders: Secondary | ICD-10-CM | POA: Insufficient documentation

## 2013-11-14 DIAGNOSIS — Z87891 Personal history of nicotine dependence: Secondary | ICD-10-CM | POA: Insufficient documentation

## 2013-11-14 DIAGNOSIS — M5430 Sciatica, unspecified side: Secondary | ICD-10-CM | POA: Insufficient documentation

## 2013-11-14 DIAGNOSIS — M25561 Pain in right knee: Secondary | ICD-10-CM

## 2013-11-14 DIAGNOSIS — Z8619 Personal history of other infectious and parasitic diseases: Secondary | ICD-10-CM | POA: Insufficient documentation

## 2013-11-14 DIAGNOSIS — M5431 Sciatica, right side: Secondary | ICD-10-CM

## 2013-11-14 DIAGNOSIS — M62838 Other muscle spasm: Secondary | ICD-10-CM | POA: Insufficient documentation

## 2013-11-14 DIAGNOSIS — E669 Obesity, unspecified: Secondary | ICD-10-CM | POA: Insufficient documentation

## 2013-11-14 DIAGNOSIS — M25569 Pain in unspecified knee: Secondary | ICD-10-CM | POA: Insufficient documentation

## 2013-11-14 MED ORDER — APAP 325 MG PO TABS: 650.0000 mg | ORAL_TABLET | Freq: Four times a day (QID) | ORAL | Status: DC | PRN

## 2013-11-14 MED ORDER — SALINE SPRAY 0.65 % NA SOLN: 1.0000 | NASAL | Status: DC | PRN

## 2013-11-14 NOTE — ED Notes (Signed)
Pt alert, arrives from home, c/o pain in legs, onset several weeks, pain in chest, onset this week, pain in neck, onset today, resp even unlabored, skin pwd, ambulates to room, no obvious s/s of distress or discomfort noted

## 2013-11-14 NOTE — ED Provider Notes (Signed)
CSN: [id number]     Arrival date & time 11/14/13  8182 History   First MD Initiated Contact with Patient 11/14/13 [phone]     Chief Complaint  Patient presents with  . Muscle Pain    All Over     (Consider location/radiation/quality/duration/timing/severity/associated sxs/prior Treatment) HPI Comments: Phyllis Dalton is a 23 y.o. female with a past medical history of obesity, panic attacks, depression presenting the Emergency Department with multiple complaints: chest discomfort, low back pain, right knee pain, left shoulder pain.  The patient reports she has intermittent chest discomfort for 4 days.  She reports sharp pain, that can be left or migrate to the right side.  She reports the discomfort lasts for seconds to max of 3 minutes.  Denies associated SOB, palpitations. Denies pleurisy.  Denies history of murmur, previous MI, arrythmia, or family history of early MI.  No recent travel, family history or personal history of DVT/PE, lower extremity swelling, smoking, cancer, or exogenous estrogen. Back and body discomfort since Friday, worsened by movement. The patient reports cough and nasal congestion for several days, denies fever.   She reports right low back pain with radiation to right lower extremity for 5 years.  She reports persistent symptoms.  Denies weakness or paresthesias.  She also reports right knee pain since she started working.  She reports her work boots rub her right knee, no recent injury.  She also complains of left should and neck discomfort due to work.  She reports pain with movement.  Denies paresthesias, or weakness in upper extremities.   The history is provided by the patient and medical records. No language interpreter was used.    Past Medical History  Diagnosis Date  . Panic attack   . Obesity   . Depression   . Trichomonal vaginitis    Past Surgical History  Procedure Laterality Date  . Mouth surgery    . Tonsillectomy    . Addenoidectomy     Family  History  Problem Relation Age of Onset  . Diabetes Other   . Diabetes Mother   . HIV Mother    History  Substance Use Topics  . Smoking status: Former Smoker    Types: Cigarettes    Quit date: 12/25/2012  . Smokeless tobacco: Never Used  . Alcohol Use: No     Comment: rare   OB History   Grav Para Term Preterm Abortions TAB SAB Ect Mult Living   2 2 2  0 0 0 0 0 0 2     Review of Systems  Constitutional: Negative for fever and chills.  HENT: Positive for congestion and rhinorrhea.   Respiratory: Positive for cough. Negative for shortness of breath.   Cardiovascular: Positive for chest pain. Negative for palpitations and leg swelling.  Gastrointestinal: Negative for vomiting and abdominal pain.  Musculoskeletal: Positive for arthralgias, back pain and myalgias. Negative for gait problem and joint swelling.      Allergies  Review of patient's allergies indicates no known allergies.  Home Medications   Current Outpatient Rx  Name  Route  Sig  Dispense  Refill  . Aspirin-Caffeine (BAYER BACK & BODY PAIN EX ST) 500-32.5 MG TABS   Oral   Take 1 tablet by mouth every 6 (six) hours as needed (pain).          BP 112/58  Pulse 95  Temp(Src) 98.2 F (36.8 C) (Oral)  Resp 20  SpO2 97%  LMP 11/07/2013 Physical Exam  Nursing note and  vitals reviewed. Constitutional: She is oriented to person, place, and time. She appears well-developed and well-nourished. No distress.  HENT:  Head: Normocephalic and atraumatic.  Eyes: EOM are normal. Pupils are equal, round, and reactive to light. No scleral icterus.  Neck: Neck supple.  Cardiovascular: Normal rate, regular rhythm and normal heart sounds.   No murmur heard. Pulmonary/Chest: Effort normal and breath sounds normal. She has no wheezes.  Abdominal: Soft. Bowel sounds are normal. There is no tenderness. There is no rebound and no guarding.  Musculoskeletal: She exhibits no edema.       Left shoulder: She exhibits  tenderness. She exhibits normal range of motion and normal strength.       Right knee: She exhibits normal range of motion, no swelling, no effusion, no ecchymosis, no deformity, no laceration and no erythema. No tenderness found.       Lumbar back: She exhibits tenderness. She exhibits normal range of motion and no swelling.       Back:  Pressure of Right SI joint recreates Right lower leg discomfort.  Neurological: She is alert and oriented to person, place, and time. No cranial nerve deficit or sensory deficit. Coordination and gait normal.  Reflex Scores:      Patellar reflexes are 1+ on the right side and 1+ on the left side.      Achilles reflexes are 1+ on the right side and 1+ on the left side. Skin: Skin is warm and dry. No rash noted.  Psychiatric: She has a normal mood and affect. Her behavior is normal.    ED Course  Procedures (including critical care time) Labs Review Labs Reviewed - No data to display Imaging Review Dg Chest 2 View  11/14/2013   CLINICAL DATA:  Short of breath and cough  EXAM: CHEST  2 VIEW  COMPARISON:  01/31/2010  FINDINGS: The heart size and mediastinal contours are within normal limits. Both lungs are clear. The visualized skeletal structures are unremarkable.  IMPRESSION: No active cardiopulmonary disease.   Electronically Signed   By: Maryclare Bean M.D.   On: 11/14/2013 10:33     EKG Interpretation None      Date: 11/14/2013  Rate: 92   Rhythm: normal sinus rhythm  QRS Axis: normal  Intervals: normal  ST/T Wave abnormalities: normal  Conduction Disutrbances:none  Narrative Interpretation: Baseline wander in leads V4 and V6  Old EKG Reviewed:        MDM   Final diagnoses:  Upper respiratory infection  Sciatica of right side  Right knee pain  Trapezius muscle spasm  Breast feeding status of mother   Pt with numerous complaints.  Mostly muscular skeletal complaints, recreated with movement or palpation.  Atypical chest pain, normal EKG,  normal chest XR.  Obesity is pts only risk factor for ACS, doubt PE, no lower extremity swelling, no SOB, oxygen saturation 97% RA. Tylenol and RICE encouraged (once the patient is released by OB/GYN and is no longer breast feeding).  Saline nasal spray for URI. Discussed lab results, imaging results, and treatment plan with the patient. Return precautions given. Reports understanding and no other concerns at this time.  Patient is stable for discharge at this time.   Meds given in ED:  Medications - No data to display  Discharge Medication List as of 11/14/2013 11:20 AM    START taking these medications   Details  sodium chloride (OCEAN) 0.65 % SOLN nasal spray Place 1 spray into both nostrils as needed  for congestion., Starting 11/14/2013, Until Discontinued, Print            Lorrine Kin, PA-C 11/16/13 1231

## 2013-11-14 NOTE — Discharge Instructions (Signed)
Call for a follow up appointment with a Family or Primary Care Provider for further evaluation of your upper respiratory infection. Follow up with your OB/GYN for your Implant placement and medication that you can take while breast feeding. Place a pad between your boot and your right knee to eliminate the rubbing discomfort.  Return to the Emergency Department if Symptoms worsen.   Take medication as prescribed.  You can use saline nasal spray for your sinus congestion.

## 2013-11-16 NOTE — ED Provider Notes (Signed)
Medical screening examination/treatment/procedure(s) were performed by non-physician practitioner and as supervising physician I was immediately available for consultation/collaboration.   EKG Interpretation   Date/Time:  Thursday November 14 2013 08:45:34 EDT Ventricular Rate:  92 PR Interval:  135 QRS Duration: 89 QT Interval:  357 QTC Calculation: 442 R Axis:   42 Text Interpretation:  Sinus rhythm Baseline wander in lead(s) V4 V6 No  significant change since last tracing Reconfirmed by GOLDSTON  MD, SCOTT  ([id number]) on 11/16/2013 3:24:11 PM        Ephraim Hamburger, MD 11/16/13 1524

## 2013-11-18 ENCOUNTER — Encounter (HOSPITAL_COMMUNITY): Payer: Self-pay | Admitting: Emergency Medicine

## 2013-11-18 ENCOUNTER — Emergency Department (HOSPITAL_COMMUNITY)
Admission: EM | Admit: 2013-11-18 | Discharge: 2013-11-18 | Disposition: A | Discharge status: Home / Self Care | Payer: Medicaid Other | Attending: Emergency Medicine | Admitting: Emergency Medicine

## 2013-11-18 DIAGNOSIS — M79609 Pain in unspecified limb: Secondary | ICD-10-CM | POA: Insufficient documentation

## 2013-11-18 DIAGNOSIS — E669 Obesity, unspecified: Secondary | ICD-10-CM | POA: Insufficient documentation

## 2013-11-18 DIAGNOSIS — F3289 Other specified depressive episodes: Secondary | ICD-10-CM | POA: Insufficient documentation

## 2013-11-18 DIAGNOSIS — F329 Major depressive disorder, single episode, unspecified: Secondary | ICD-10-CM | POA: Insufficient documentation

## 2013-11-18 DIAGNOSIS — M5431 Sciatica, right side: Secondary | ICD-10-CM

## 2013-11-18 DIAGNOSIS — M5430 Sciatica, unspecified side: Secondary | ICD-10-CM | POA: Insufficient documentation

## 2013-11-18 DIAGNOSIS — R209 Unspecified disturbances of skin sensation: Secondary | ICD-10-CM | POA: Insufficient documentation

## 2013-11-18 DIAGNOSIS — Z87891 Personal history of nicotine dependence: Secondary | ICD-10-CM | POA: Insufficient documentation

## 2013-11-18 DIAGNOSIS — Z8619 Personal history of other infectious and parasitic diseases: Secondary | ICD-10-CM | POA: Insufficient documentation

## 2013-11-18 MED ORDER — CYCLOBENZAPRINE HCL 10 MG PO TABS: 10.0000 mg | ORAL_TABLET | Freq: Two times a day (BID) | ORAL | Status: DC | PRN

## 2013-11-18 MED ORDER — TRAMADOL HCL 50 MG PO TABS: 50.0000 mg | ORAL_TABLET | Freq: Four times a day (QID) | ORAL | Status: DC | PRN

## 2013-11-18 NOTE — ED Provider Notes (Signed)
CSN: [id number]     Arrival date & time 11/18/13  1137 History   First MD Initiated Contact with Patient 11/18/13 1252  This chart was scribed for non-physician practitioner Noland Fordyce, PA-C working with Neta Ehlers, MD by Anastasia Pall, ED scribe. This patient was seen in room [facility] and the patient's care was started at 1:17 PM.    Chief Complaint  Patient presents with  . Sciatica   (Consider location/radiation/quality/duration/timing/severity/associated sxs/prior Treatment) The history is provided by the patient. No language interpreter was used.   HPI Comments: MICHAELLA Dalton is a 23 y.o. female who presents to the Emergency Department complaining of 9/10, burning, constant back pain, onset yesterday, with an associated limp when ambulating secondary to her pain. She reports associated right foot numbness and LE pain. She reports coming to ED 3 days ago for a URI and back pain, she was told she had sciatica and states she was told to come back if her pain worsened. She reports someone falling on her back 9 years ago and has had similar intermittent pain ever since. She reports recent pregnancy, denies her back pain starting at that time. She has been taking Ibuprofen for her pain without relief. Does report some relief with certain stretches but not all the time. She denies upper back pain, neck pain, urinary symptoms, GI symptoms, and any other associated symptoms. She denies allergies to medications. She denies any medical history.   PCP - Ron Parker, MD  Past Medical History  Diagnosis Date  . Panic attack   . Obesity   . Depression   . Trichomonal vaginitis    Past Surgical History  Procedure Laterality Date  . Mouth surgery    . Tonsillectomy    . Addenoidectomy     Family History  Problem Relation Age of Onset  . Diabetes Other   . Diabetes Mother   . HIV Mother    History  Substance Use Topics  . Smoking status: Former Smoker    Types: Cigarettes     Quit date: 12/25/2012  . Smokeless tobacco: Never Used  . Alcohol Use: No     Comment: rare   OB History   Grav Para Term Preterm Abortions TAB SAB Ect Mult Living   2 2 2  0 0 0 0 0 0 2     Review of Systems  Gastrointestinal: Negative.   Genitourinary: Negative.   Musculoskeletal: Positive for arthralgias (right LE), back pain (lower) and gait problem (secondary to back pain). Negative for neck pain.  Neurological: Positive for numbness (right foot). Negative for weakness.   Allergies  Review of patient's allergies indicates no known allergies.  Home Medications   Current Outpatient Rx  Name  Route  Sig  Dispense  Refill  . Aspirin-Caffeine (BAYER BACK & BODY PAIN EX ST) 500-32.5 MG TABS   Oral   Take 1 tablet by mouth every 6 (six) hours as needed (pain).         Marland Kitchen ibuprofen (ADVIL,MOTRIN) 800 MG tablet   Oral   Take 800 mg by mouth every 6 (six) hours as needed for moderate pain.         . sodium chloride (OCEAN) 0.65 % SOLN nasal spray   Each Nare   Place 1 spray into both nostrils as needed for congestion.         . cyclobenzaprine (FLEXERIL) 10 MG tablet   Oral   Take 1 tablet (10 mg total) by mouth 2 (  two) times daily as needed for muscle spasms.   20 tablet   0   . traMADol (ULTRAM) 50 MG tablet   Oral   Take 1 tablet (50 mg total) by mouth every 6 (six) hours as needed.   15 tablet   0    BP 111/61  Pulse 83  Temp(Src) 98.3 F (36.8 C) (Oral)  Resp 20  SpO2 100%  LMP 11/07/2013  Breastfeeding? No  Physical Exam  Nursing note and vitals reviewed. Constitutional: She appears well-developed and well-nourished. No distress.  HENT:  Head: Normocephalic and atraumatic.  Eyes: Conjunctivae are normal. No scleral icterus.  Neck: Normal range of motion.  Cardiovascular: Normal rate, regular rhythm and normal heart sounds.   Pulmonary/Chest: Effort normal and breath sounds normal. No respiratory distress. She has no wheezes. She has no rales. She  exhibits no tenderness.  Abdominal: Soft. Bowel sounds are normal. She exhibits no distension and no mass. There is no tenderness. There is no rebound and no guarding.  Musculoskeletal: Normal range of motion.  Tenderness in right buttocks and right lateral thigh. Antalgic gait.  No midline spinal tenderness. FROM right hip w/o crepitus.   Neurological: She is alert.  Skin: Skin is warm and dry. She is not diaphoretic.  Skin in tact. No ecchymosis, erythema, or warmth. No red streaking, induration, or evidence of underlying infection.   ED Course  Procedures (including critical care time)  DIAGNOSTIC STUDIES: Oxygen Saturation is 100% on room air, normal by my interpretation.    COORDINATION OF CARE: 1:23 PM-Discussed treatment plan which includes pain medication and muscle relaxant with pt at bedside and pt agreed to plan.   Labs Review Labs Reviewed - No data to display Imaging Review No results found.   EKG Interpretation None      MDM   Final diagnoses:  Right sciatic nerve pain    pt presenting with right sciatic pain w/o hx of fall or trauma. No red flag symptoms. Hx of similar symptoms.  Will tx pain, do not believe imaging needed at this time.  Rx: flexeril and tramadol. Return precautions provided. Pt verbalized understanding and agreement with tx plan.   I personally performed the services described in this documentation, which was scribed in my presence. The recorded information has been reviewed and is accurate.    Noland Fordyce, PA-C 11/19/13 [phone]

## 2013-11-18 NOTE — ED Notes (Signed)
Pt states sciatica pain is back; unrelieved by ibuprofen; states since yesterday she hasn't been able to feel her foot; states is walking with a limp due to back pain

## 2013-11-20 NOTE — ED Provider Notes (Signed)
Medical screening examination/treatment/procedure(s) were performed by non-physician practitioner and as supervising physician I was immediately available for consultation/collaboration.   EKG Interpretation None        Neta Ehlers, MD 11/20/13 [phone]

## 2013-12-21 ENCOUNTER — Encounter (HOSPITAL_COMMUNITY): Payer: Self-pay | Admitting: Emergency Medicine

## 2013-12-21 ENCOUNTER — Emergency Department (HOSPITAL_COMMUNITY)
Admission: EM | Admit: 2013-12-21 | Discharge: 2013-12-21 | Disposition: A | Discharge status: Home / Self Care | Payer: Medicaid Other | Attending: Emergency Medicine | Admitting: Emergency Medicine

## 2013-12-21 DIAGNOSIS — F329 Major depressive disorder, single episode, unspecified: Secondary | ICD-10-CM | POA: Insufficient documentation

## 2013-12-21 DIAGNOSIS — Z23 Encounter for immunization: Secondary | ICD-10-CM | POA: Insufficient documentation

## 2013-12-21 DIAGNOSIS — F410 Panic disorder [episodic paroxysmal anxiety] without agoraphobia: Secondary | ICD-10-CM | POA: Insufficient documentation

## 2013-12-21 DIAGNOSIS — IMO0002 Reserved for concepts with insufficient information to code with codable children: Secondary | ICD-10-CM | POA: Insufficient documentation

## 2013-12-21 DIAGNOSIS — J329 Chronic sinusitis, unspecified: Secondary | ICD-10-CM | POA: Insufficient documentation

## 2013-12-21 DIAGNOSIS — L0390 Cellulitis, unspecified: Secondary | ICD-10-CM

## 2013-12-21 DIAGNOSIS — H9209 Otalgia, unspecified ear: Secondary | ICD-10-CM | POA: Insufficient documentation

## 2013-12-21 DIAGNOSIS — F3289 Other specified depressive episodes: Secondary | ICD-10-CM | POA: Insufficient documentation

## 2013-12-21 DIAGNOSIS — Z8619 Personal history of other infectious and parasitic diseases: Secondary | ICD-10-CM | POA: Insufficient documentation

## 2013-12-21 DIAGNOSIS — Z87891 Personal history of nicotine dependence: Secondary | ICD-10-CM | POA: Insufficient documentation

## 2013-12-21 DIAGNOSIS — E669 Obesity, unspecified: Secondary | ICD-10-CM | POA: Insufficient documentation

## 2013-12-21 MED ORDER — HYDROCODONE-ACETAMINOPHEN 5-325 MG PO TABS: 1.0000 | ORAL_TABLET | ORAL | Status: DC | PRN

## 2013-12-21 MED ORDER — AMOXICILLIN-POT CLAVULANATE 875-125 MG PO TABS: 1.0000 | ORAL_TABLET | Freq: Two times a day (BID) | ORAL | Status: DC

## 2013-12-21 MED ORDER — TETANUS-DIPHTH-ACELL PERTUSSIS 5-2.5-18.5 LF-MCG/0.5 IM SUSP
0.5000 mL | Freq: Once | INTRAMUSCULAR | Status: AC
  Administered 2013-12-21: 0.5 mL via INTRAMUSCULAR
  Filled 2013-12-21: qty 0.5

## 2013-12-21 NOTE — ED Notes (Signed)
Pt reports swelling, redness and pain in l/arm-inner aspect of elbow x 24 hrs. Cellulitis 3cm x 1.5 cm C/o sore throat, ear pain, sinus pressure and headache x 2 weeks

## 2013-12-21 NOTE — ED Notes (Signed)
Pt states c/o abscess to lt inner elbow since yesterday.  C/o nasal congestion w/ associated headache and otalgia.  Also wants a refill of flexeril.

## 2013-12-21 NOTE — Discharge Instructions (Signed)
Cellulitis Cellulitis is an infection of the skin and the tissue beneath it. The infected area is usually red and tender. Cellulitis occurs most often in the arms and lower legs.  CAUSES  Cellulitis is caused by bacteria that enter the skin through cracks or cuts in the skin. The most common types of bacteria that cause cellulitis are Staphylococcus and Streptococcus. SYMPTOMS   Redness and warmth.  Swelling.  Tenderness or pain.  Fever. DIAGNOSIS  Your caregiver can usually determine what is wrong based on a physical exam. Blood tests may also be done. TREATMENT  Treatment usually involves taking an antibiotic medicine. HOME CARE INSTRUCTIONS   Take your antibiotics as directed. Finish them even if you start to feel better.  Keep the infected arm or leg elevated to reduce swelling.  Apply a warm cloth to the affected area up to 4 times per day to relieve pain.  Only take over-the-counter or prescription medicines for pain, discomfort, or fever as directed by your caregiver.  Keep all follow-up appointments as directed by your caregiver. SEEK MEDICAL CARE IF:   You notice red streaks coming from the infected area.  Your red area gets larger or turns dark in color.  Your bone or joint underneath the infected area becomes painful after the skin has healed.  Your infection returns in the same area or another area.  You notice a swollen bump in the infected area.  You develop new symptoms. SEEK IMMEDIATE MEDICAL CARE IF:   You have a fever.  You feel very sleepy.  You develop vomiting or diarrhea.  You have a general ill feeling (malaise) with muscle aches and pains. MAKE SURE YOU:   Understand these instructions.  Will watch your condition.  Will get help right away if you are not doing well or get worse. Document Released: 05/18/2005 Document Revised: 02/07/2012 Document Reviewed: 10/24/2011 [facility] Patient Information 2014 [place]. Sinusitis Sinusitis is redness, soreness, and swelling (inflammation) of the paranasal sinuses. Paranasal sinuses are air pockets within the bones of your face (beneath the eyes, the middle of the forehead, or above the eyes). In healthy paranasal sinuses, mucus is able to drain out, and air is able to circulate through them by way of your nose. However, when your paranasal sinuses are inflamed, mucus and air can become trapped. This can allow bacteria and other germs to grow and cause infection. Sinusitis can develop quickly and last only a short time (acute) or continue over a long period (chronic). Sinusitis that lasts for more than 12 weeks is considered chronic.  CAUSES  Causes of sinusitis include:  Allergies.  Structural abnormalities, such as displacement of the cartilage that separates your nostrils (deviated septum), which can decrease the air flow through your nose and sinuses and affect sinus drainage.  Functional abnormalities, such as when the small hairs (cilia) that line your sinuses and help remove mucus do not work properly or are not present. SYMPTOMS  Symptoms of acute and chronic sinusitis are the same. The primary symptoms are pain and pressure around the affected sinuses. Other symptoms include:  Upper toothache.  Earache.  Headache.  Bad breath.  Decreased sense of smell and taste.  A cough, which worsens when you are lying flat.  Fatigue.  Fever.  Thick drainage from your nose, which often is green and may contain pus (purulent).  Swelling and warmth over the affected sinuses. DIAGNOSIS  Your caregiver will perform a physical exam. During the exam, your caregiver may:  Look in your nose for signs of abnormal growths in your nostrils (nasal polyps).  Tap over the affected sinus to check for signs of infection.  View the inside of your sinuses (endoscopy) with a special imaging device with a light attached (endoscope), which is inserted into your  sinuses. If your caregiver suspects that you have chronic sinusitis, one or more of the following tests may be recommended:  Allergy tests.  Nasal culture A sample of mucus is taken from your nose and sent to a lab and screened for bacteria.  Nasal cytology A sample of mucus is taken from your nose and examined by your caregiver to determine if your sinusitis is related to an allergy. TREATMENT  Most cases of acute sinusitis are related to a viral infection and will resolve on their own within 10 days. Sometimes medicines are prescribed to help relieve symptoms (pain medicine, decongestants, nasal steroid sprays, or saline sprays).  However, for sinusitis related to a bacterial infection, your caregiver will prescribe antibiotic medicines. These are medicines that will help kill the bacteria causing the infection.  Rarely, sinusitis is caused by a fungal infection. In theses cases, your caregiver will prescribe antifungal medicine. For some cases of chronic sinusitis, surgery is needed. Generally, these are cases in which sinusitis recurs more than 3 times per year, despite other treatments. HOME CARE INSTRUCTIONS   Drink plenty of water. Water helps thin the mucus so your sinuses can drain more easily.  Use a humidifier.  Inhale steam 3 to 4 times a day (for example, sit in the bathroom with the shower running).  Apply a warm, moist washcloth to your face 3 to 4 times a day, or as directed by your caregiver.  Use saline nasal sprays to help moisten and clean your sinuses.  Take over-the-counter or prescription medicines for pain, discomfort, or fever only as directed by your caregiver. SEEK IMMEDIATE MEDICAL CARE IF:  You have increasing pain or severe headaches.  You have nausea, vomiting, or drowsiness.  You have swelling around your face.  You have vision problems.  You have a stiff neck.  You have difficulty breathing. MAKE SURE YOU:   Understand these  instructions.  Will watch your condition.  Will get help right away if you are not doing well or get worse. Document Released: 08/08/2005 Document Revised: 10/31/2011 Document Reviewed: 08/23/2011 [facility] Patient Information 2014 [place], [place].

## 2013-12-21 NOTE — ED Provider Notes (Signed)
CSN: [id number]     Arrival date & time 12/21/13  1420 History   First MD Initiated Contact with Patient 12/21/13 1456     Chief Complaint  Patient presents with  . Nasal Congestion  . Otalgia  . Abscess    arm  . Cellulitis    l/elbow-inner aspect  3cm x 1.5 cm raised area  . Headache    sinus pressure  . Back Pain    recurrent     (Consider location/radiation/quality/duration/timing/severity/associated sxs/prior Treatment) HPI Comments: Patient presents to the ED with a chief complaint of multiple complaints.    She complains of sinus congestion, headache, rhinorrhea, otalgia, and sore throat that have been gradually worsening for the past 2 weeks.  She does have seasonal allergies, but states that this feels worse.  She denies any fevers or chills.  She has not tried taking anything to alleviate her symptoms.  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  Additionally, she states that she has had a rash developing on her arm for the past 24 hours.  She denies any injury, but states that she sleeps with her son in her arms, and might have been cut by his tooth.  She states that the pain is moderate.  It is worsened with movement.  Rest makes it better.  The history is provided by the patient. No language interpreter was used.    Past Medical History  Diagnosis Date  . Panic attack   . Obesity   . Depression   . Trichomonal vaginitis    Past Surgical History  Procedure Laterality Date  . Mouth surgery    . Tonsillectomy    . Addenoidectomy     Family History  Problem Relation Age of Onset  . Diabetes Other   . Diabetes Mother   . HIV Mother    History  Substance Use Topics  . Smoking status: Former Smoker    Types: Cigarettes    Quit date: 12/25/2012  . Smokeless tobacco: Never Used  . Alcohol Use: No     Comment: rare   OB History   Grav Para Term Preterm Abortions TAB SAB Ect Mult Living   2 2 2  0 0 0 0 0 0 2      Review of Systems  Constitutional: Negative for fever and chills.  HENT: Positive for ear pain, postnasal drip, sinus pressure and sore throat.   Respiratory: Negative for shortness of breath.   Cardiovascular: Negative for chest pain.  Gastrointestinal: Negative for nausea, vomiting, diarrhea and constipation.  Genitourinary: Negative for dysuria.  Skin: Positive for rash.      Allergies  Review of patient's allergies indicates no known allergies.  Home Medications   Prior to Admission medications   Medication Sig Start Date End Date Taking? Authorizing Provider  cyclobenzaprine (FLEXERIL) 10 MG tablet Take 1 tablet (10 mg total) by mouth 2 (two) times daily as needed for muscle spasms. 11/18/13  Yes Noland Fordyce, PA-C  traMADol (ULTRAM) 50 MG tablet Take 1 tablet (50 mg total) by mouth every 6 (six) hours as needed. 11/18/13  Yes Noland Fordyce, PA-C   BP 121/59  Pulse 108  Temp(Src) 98.4 F (36.9 C) (Oral)  Resp 16  SpO2 100% Physical Exam  Nursing note and vitals reviewed. Constitutional: She is oriented to person, place, and time. She appears well-developed and well-nourished.  HENT:  Head: Normocephalic and atraumatic.  Eyes: Conjunctivae and EOM  are normal. Pupils are equal, round, and reactive to light.  Neck: Normal range of motion. Neck supple.  Cardiovascular: Normal rate and regular rhythm.  Exam reveals no gallop and no friction rub.   No murmur heard. Normal rate on my exam  Pulmonary/Chest: Effort normal and breath sounds normal. No respiratory distress. She has no wheezes. She has no rales. She exhibits no tenderness.  Abdominal: Soft. Bowel sounds are normal. She exhibits no distension and no mass. There is no tenderness. There is no rebound and no guarding.  Musculoskeletal: Normal range of motion. She exhibits no edema and no tenderness.  Neurological: She is alert and oriented to person, place, and time.  Skin: Skin is warm and dry.  Mild cellulitis  to left antecubital fossa, no abscess  Psychiatric: She has a normal mood and affect. Her behavior is normal. Judgment and thought content normal.    ED Course  Procedures (including critical care time) Labs Review Labs Reviewed - No data to display  Imaging Review No results found.   EKG Interpretation None      MDM   Final diagnoses:  Cellulitis  Sinusitis    Patient with mild cellulitis and possibly sinusitis.  Will treat with augmentin.  Tdap updated.  Recommend follow-up in 2 days for recheck.  Return sooner for worsening symptoms.  Patient understands and agrees with the plan.  She is stable and ready for discharge.     Montine Circle, PA-C 12/21/13 2132

## 2013-12-25 NOTE — ED Provider Notes (Signed)
Medical screening examination/treatment/procedure(s) were performed by non-physician practitioner and as supervising physician I was immediately available for consultation/collaboration.   Babette Relic, MD 12/25/13 2217

## 2014-03-01 ENCOUNTER — Encounter (HOSPITAL_COMMUNITY): Payer: Self-pay | Admitting: Emergency Medicine

## 2014-03-01 ENCOUNTER — Emergency Department (HOSPITAL_COMMUNITY)
Admission: EM | Admit: 2014-03-01 | Discharge: 2014-03-01 | Disposition: A | Discharge status: Home / Self Care | Payer: Medicaid Other | Attending: Emergency Medicine | Admitting: Emergency Medicine

## 2014-03-01 DIAGNOSIS — M25519 Pain in unspecified shoulder: Secondary | ICD-10-CM | POA: Diagnosis not present

## 2014-03-01 DIAGNOSIS — R52 Pain, unspecified: Secondary | ICD-10-CM | POA: Diagnosis present

## 2014-03-01 DIAGNOSIS — Z8659 Personal history of other mental and behavioral disorders: Secondary | ICD-10-CM | POA: Diagnosis not present

## 2014-03-01 DIAGNOSIS — IMO0001 Reserved for inherently not codable concepts without codable children: Secondary | ICD-10-CM | POA: Diagnosis not present

## 2014-03-01 DIAGNOSIS — M5430 Sciatica, unspecified side: Secondary | ICD-10-CM | POA: Diagnosis not present

## 2014-03-01 DIAGNOSIS — Z87891 Personal history of nicotine dependence: Secondary | ICD-10-CM | POA: Insufficient documentation

## 2014-03-01 DIAGNOSIS — E669 Obesity, unspecified: Secondary | ICD-10-CM | POA: Diagnosis not present

## 2014-03-01 DIAGNOSIS — M791 Myalgia, unspecified site: Secondary | ICD-10-CM

## 2014-03-01 DIAGNOSIS — Z8619 Personal history of other infectious and parasitic diseases: Secondary | ICD-10-CM | POA: Insufficient documentation

## 2014-03-01 DIAGNOSIS — M25512 Pain in left shoulder: Secondary | ICD-10-CM

## 2014-03-01 DIAGNOSIS — M5441 Lumbago with sciatica, right side: Secondary | ICD-10-CM

## 2014-03-01 MED ORDER — KETOROLAC TROMETHAMINE 60 MG/2ML IM SOLN
60.0000 mg | Freq: Once | INTRAMUSCULAR | Status: AC
  Administered 2014-03-01: 60 mg via INTRAMUSCULAR
  Filled 2014-03-01: qty 2

## 2014-03-01 MED ORDER — MELOXICAM 7.5 MG PO TABS: 15.0000 mg | ORAL_TABLET | Freq: Every day | ORAL | Status: DC

## 2014-03-01 NOTE — ED Provider Notes (Signed)
CSN: [id number]     Arrival date & time 03/01/14  1249 History  This chart was scribed for non-physician practitioner, Noland Fordyce, PA-C working with Tanna Furry, MD, by Erling Conte, ED Scribe. This patient was seen in room WTR8/WTR8 and the patient's care was started at 1:46 PM.    Chief Complaint  Patient presents with  . Generalized Body Aches     The history is provided by the patient. No language interpreter was used.   HPI Comments: Phyllis Dalton is a 23 y.o. female who presents to the Emergency Department complaining of intermittent, sore, gradually worsening generalized body aches for 2 weeks. She states that pain is localized in her left shoulder blade, lower right pack and bilateral feet. She denies any injury or heavy lifting. Pain is 9/10 at worst.  She denies taking any medication for the pain. She states she has been told that she has left sided sciatica. She states she stands up at work all the time. Patient states that pain is exacerbated by movement and walking. She reports she had a PCP but she has been trying to find a new one. She denies any fever, nausea, emesis, urinary or bowel incontinence.    Past Medical History  Diagnosis Date  . Panic attack   . Obesity   . Depression   . Trichomonal vaginitis    Past Surgical History  Procedure Laterality Date  . Mouth surgery    . Tonsillectomy    . Addenoidectomy     Family History  Problem Relation Age of Onset  . Diabetes Other   . Diabetes Mother   . HIV Mother    History  Substance Use Topics  . Smoking status: Former Smoker    Types: Cigarettes    Quit date: 12/25/2012  . Smokeless tobacco: Never Used  . Alcohol Use: No     Comment: rare   OB History   Grav Para Term Preterm Abortions TAB SAB Ect Mult Living   2 2 2  0 0 0 0 0 0 2     Review of Systems  Constitutional: Negative for fever.  Gastrointestinal: Negative for nausea and vomiting.       No bowel incontinence  Genitourinary:     No urinary incontinence  Musculoskeletal: Positive for arthralgias (bilateral feet, left shoulder blade), back pain (lower right back) and myalgias.  All other systems reviewed and are negative.     Allergies  Review of patient's allergies indicates no known allergies.  Home Medications   Prior to Admission medications   Medication Sig Start Date End Date Taking? Authorizing Provider  meloxicam (MOBIC) 7.5 MG tablet Take 2 tablets (15 mg total) by mouth daily. 03/01/14   Noland Fordyce, PA-C   Triage Vitals: BP 112/67  Pulse 87  Temp(Src) 98.7 F (37.1 C) (Oral)  Resp 16  SpO2 99% Physical Exam  Nursing note and vitals reviewed. Constitutional: She is oriented to person, place, and time. She appears well-developed and well-nourished.  HENT:  Head: Normocephalic and atraumatic.  Eyes: EOM are normal.  Neck: Normal range of motion.  Cardiovascular: Normal rate, regular rhythm and normal heart sounds.   Pulmonary/Chest: Effort normal and breath sounds normal. No respiratory distress. She has no wheezes. She has no rales. She exhibits no tenderness.  Musculoskeletal: Normal range of motion. She exhibits tenderness.  Tenderness along right lower lumbar musculature and right buttock. No midline spine tenderness. Full ROM of lower extremities. Left shoulder: tenderness over left upper  trapezius. No bony tenderness. Limited ROM of upper arms due to lack of effort. 5/5 strength in lower extremities bilaterally.   Neurological: She is alert and oriented to person, place, and time.  Reflex Scores:      Patellar reflexes are 2+ on the right side and 2+ on the left side. Sensation in tact.  Skin: Skin is warm and dry.  Psychiatric: She has a normal mood and affect. Her behavior is normal.    ED Course  Procedures (including critical care time)  DIAGNOSTIC STUDIES: Oxygen Saturation is 99% on RA, normal by Phyllis interpretation.    COORDINATION OF CARE:    Labs Review Labs Reviewed  - No data to display  Imaging Review No results found.   EKG Interpretation None      MDM   Final diagnoses:  Right-sided low back pain with right-sided sciatica  Left shoulder pain  Myalgia    Pt is a 23yo female c/o myalgias, denies recent illness including fever, n/v/d. Denies known injuries. Reports standing long hours at work. No pain medication tried PTA.  On exam, pt appears well, non-toxic.  NAD. Afebrile. No obvious deformities. No red flag symptoms. Not concerned for emergent process taking place including cauda equina, spinal abscess, or other underlying infectious process. Pain appears muscular in nature. Pain likely due to overuse/long hours standing at work. Tordadol given in ED. Will discharge pt home with mobic. Encouraged pt get plenty of sleep each night. Advised to f/u with [facility]. Return precautions provided. Pt verbalized understanding and agreement with tx plan.   I personally performed the services described in this documentation, which was scribed in Phyllis presence. The recorded information has been reviewed and is accurate.     Noland Fordyce, PA-C 03/02/14 1016

## 2014-03-01 NOTE — Discharge Instructions (Signed)
Be sure to stay well hydrated, preferably with water, get at least 8 hours of sleep, and wear comfortable shoes when on your feet for long hours at a time.  See below for further instructions.

## 2014-03-01 NOTE — ED Notes (Signed)
She c/o non-traumatic right post. Shoulder pain and non-traumatic pain right hip/leg x 2 weeks.  She is in no distress; and cites hx of right-sided sciatica.

## 2014-03-01 NOTE — ED Notes (Signed)
Pt c/o lt shoulder, low right back, and bilateral feet pain x 2 weeks.  Denies injury.  States it feels "sore".

## 2014-03-03 NOTE — ED Provider Notes (Signed)
Medical screening examination/treatment/procedure(s) were performed by non-physician practitioner and as supervising physician I was immediately available for consultation/collaboration.   EKG Interpretation None       Jasper Riling. Alvino Chapel, MD 03/03/14 0025

## 2014-04-26 ENCOUNTER — Emergency Department (HOSPITAL_COMMUNITY)
Admission: EM | Admit: 2014-04-26 | Discharge: 2014-04-26 | Disposition: A | Discharge status: Home / Self Care | Payer: Medicaid Other | Attending: Emergency Medicine | Admitting: Emergency Medicine

## 2014-04-26 ENCOUNTER — Emergency Department (HOSPITAL_COMMUNITY): Payer: Medicaid Other

## 2014-04-26 ENCOUNTER — Encounter (HOSPITAL_COMMUNITY): Payer: Self-pay | Admitting: Emergency Medicine

## 2014-04-26 DIAGNOSIS — R51 Headache: Secondary | ICD-10-CM | POA: Diagnosis not present

## 2014-04-26 DIAGNOSIS — M5430 Sciatica, unspecified side: Secondary | ICD-10-CM | POA: Insufficient documentation

## 2014-04-26 DIAGNOSIS — E669 Obesity, unspecified: Secondary | ICD-10-CM | POA: Diagnosis not present

## 2014-04-26 DIAGNOSIS — Z87891 Personal history of nicotine dependence: Secondary | ICD-10-CM | POA: Insufficient documentation

## 2014-04-26 DIAGNOSIS — Z3202 Encounter for pregnancy test, result negative: Secondary | ICD-10-CM | POA: Insufficient documentation

## 2014-04-26 DIAGNOSIS — R209 Unspecified disturbances of skin sensation: Secondary | ICD-10-CM | POA: Insufficient documentation

## 2014-04-26 DIAGNOSIS — Z8619 Personal history of other infectious and parasitic diseases: Secondary | ICD-10-CM | POA: Diagnosis not present

## 2014-04-26 DIAGNOSIS — Z8659 Personal history of other mental and behavioral disorders: Secondary | ICD-10-CM | POA: Insufficient documentation

## 2014-04-26 DIAGNOSIS — R519 Headache, unspecified: Secondary | ICD-10-CM

## 2014-04-26 DIAGNOSIS — M5431 Sciatica, right side: Secondary | ICD-10-CM

## 2014-04-26 DIAGNOSIS — M546 Pain in thoracic spine: Secondary | ICD-10-CM | POA: Diagnosis not present

## 2014-04-26 LAB — POC URINE PREG, ED: Preg Test, Ur: NEGATIVE

## 2014-04-26 MED ORDER — SODIUM CHLORIDE 0.9 % IV BOLUS (SEPSIS)
1000.0000 mL | Freq: Once | INTRAVENOUS | Status: AC
  Administered 2014-04-26: 1000 mL via INTRAVENOUS

## 2014-04-26 MED ORDER — DIPHENHYDRAMINE HCL 50 MG/ML IJ SOLN
25.0000 mg | Freq: Once | INTRAMUSCULAR | Status: AC
  Administered 2014-04-26: 25 mg via INTRAVENOUS
  Filled 2014-04-26: qty 1

## 2014-04-26 MED ORDER — MORPHINE SULFATE 4 MG/ML IJ SOLN
4.0000 mg | Freq: Once | INTRAMUSCULAR | Status: AC
  Administered 2014-04-26: 4 mg via INTRAVENOUS
  Filled 2014-04-26: qty 1

## 2014-04-26 MED ORDER — KETOROLAC TROMETHAMINE 30 MG/ML IJ SOLN
30.0000 mg | Freq: Once | INTRAMUSCULAR | Status: AC
  Administered 2014-04-26: 30 mg via INTRAVENOUS
  Filled 2014-04-26: qty 1

## 2014-04-26 MED ORDER — NAPROXEN 500 MG PO TABS: 500.0000 mg | ORAL_TABLET | Freq: Two times a day (BID) | ORAL | Status: DC

## 2014-04-26 MED ORDER — METOCLOPRAMIDE HCL 5 MG/ML IJ SOLN
10.0000 mg | Freq: Once | INTRAMUSCULAR | Status: AC
  Administered 2014-04-26: 10 mg via INTRAVENOUS
  Filled 2014-04-26: qty 2

## 2014-04-26 NOTE — ED Notes (Signed)
Pt significant other driving patient home. Patient alert, oriented, ambulatory upon discharge.

## 2014-04-26 NOTE — ED Notes (Signed)
MD Docherty at bedside. 

## 2014-04-26 NOTE — ED Provider Notes (Signed)
CSN: [id number]     Arrival date & time 04/26/14  1005 History   First MD Initiated Contact with Patient 04/26/14 1109     Chief Complaint  Patient presents with  . head burning      (Consider location/radiation/quality/duration/timing/severity/associated sxs/prior Treatment) Patient is a 23 y.o. female presenting with headaches. The history is provided by the patient. No language interpreter was used.  Headache Pain location:  Frontal Radiates to:  Does not radiate Pain severity now: moderate. Pain scale at highest: moderate to severe. Onset quality:  Gradual Duration:  2 days Timing:  Constant Progression:  Unchanged Chronicity:  New Similar to prior headaches: no   Relieved by:  Nothing Worsened by:  Sound and light Ineffective treatments:  None tried Associated symptoms: back pain and paresthesias (intermittent for several months in RLE)   Associated symptoms: no abdominal pain, no congestion, no cough, no diarrhea, no dizziness, no fatigue, no fever, no nausea, no neck pain, no neck stiffness, no numbness, no photophobia, no sore throat and no vomiting   Associated symptoms comment:  SOB    Past Medical History  Diagnosis Date  . Panic attack   . Obesity   . Depression   . Trichomonal vaginitis    Past Surgical History  Procedure Laterality Date  . Mouth surgery    . Tonsillectomy    . Addenoidectomy     Family History  Problem Relation Age of Onset  . Diabetes Other   . Diabetes Mother   . HIV Mother    History  Substance Use Topics  . Smoking status: Former Smoker    Types: Cigarettes    Quit date: 12/25/2012  . Smokeless tobacco: Never Used  . Alcohol Use: No     Comment: rare   OB History   Grav Para Term Preterm Abortions TAB SAB Ect Mult Living   2 2 2  0 0 0 0 0 0 2     Review of Systems  Constitutional: Negative for fever, chills, diaphoresis, activity change, appetite change and fatigue.  HENT: Negative for congestion, facial swelling,  rhinorrhea and sore throat.   Eyes: Negative for photophobia and discharge.  Respiratory: Negative for cough, chest tightness and shortness of breath.   Cardiovascular: Negative for chest pain, palpitations and leg swelling.  Gastrointestinal: Negative for nausea, vomiting, abdominal pain and diarrhea.  Endocrine: Negative for polydipsia and polyuria.  Genitourinary: Negative for dysuria, frequency, difficulty urinating and pelvic pain.  Musculoskeletal: Positive for back pain. Negative for arthralgias, neck pain and neck stiffness.  Skin: Negative for color change and wound.  Allergic/Immunologic: Negative for immunocompromised state.  Neurological: Positive for headaches and paresthesias (intermittent for several months in RLE). Negative for dizziness, facial asymmetry, weakness and numbness.  Hematological: Does not bruise/bleed easily.  Psychiatric/Behavioral: Negative for confusion and agitation.      Allergies  Review of patient's allergies indicates no known allergies.  Home Medications   Prior to Admission medications   Medication Sig Start Date End Date Taking? Authorizing Provider  etonogestrel (IMPLANON) 68 MG IMPL implant Inject 1 each into the skin once.   Yes Historical Provider, MD  naproxen (NAPROSYN) 500 MG tablet Take 1 tablet (500 mg total) by mouth 2 (two) times daily. 04/26/14   Ernestina Patches, MD   BP 99/56  Pulse 80  Temp(Src) 98.6 F (37 C) (Oral)  Resp 16  SpO2 100% Physical Exam  Constitutional: She is oriented to person, place, and time. She appears well-developed and well-nourished.  No distress.  HENT:  Head: Normocephalic and atraumatic.  Mouth/Throat: No oropharyngeal exudate.  Eyes: Pupils are equal, round, and reactive to light.  Neck: Normal range of motion. Neck supple.  Cardiovascular: Normal rate, regular rhythm and normal heart sounds.  Exam reveals no gallop and no friction rub.   No murmur heard. Pulmonary/Chest: Effort normal and breath  sounds normal. No respiratory distress. She has no wheezes. She has no rales.    Abdominal: Soft. Bowel sounds are normal. She exhibits no distension and no mass. There is no tenderness. There is no rebound and no guarding.  Musculoskeletal: Normal range of motion. She exhibits no edema and no tenderness.  Neurological: She is alert and oriented to person, place, and time. She has normal strength. She displays no tremor. No cranial nerve deficit or sensory deficit. She exhibits normal muscle tone. Coordination normal. GCS eye subscore is 4. GCS verbal subscore is 5. GCS motor subscore is 6.  Skin: Skin is warm and dry.  Psychiatric: She has a normal mood and affect.    ED Course  Procedures (including critical care time) Labs Review Labs Reviewed  POC URINE PREG, ED    Imaging Review Dg Chest 2 View  04/26/2014   CLINICAL DATA:  Thoracic back pain.  Shortness of breath.  EXAM: CHEST  2 VIEW  COMPARISON:  11/14/2013  FINDINGS: The heart size and mediastinal contours are within normal limits. Both lungs are clear. The visualized skeletal structures are unremarkable.  IMPRESSION: No active cardiopulmonary disease.   Electronically Signed   By: Sherryl Barters M.D.   On: 04/26/2014 12:51     EKG Interpretation None      MDM   Final diagnoses:  Left-sided thoracic back pain  Acute nonintractable headache, unspecified headache type  Sciatica, right    Pt is a 23 y.o. female with Pmhx as above who presents with head "burning" for 2 days, thoracic back pain worse w/ deep breathing, SOB, and continued R hip/leg pain w/ intermittent R leg numbness since visit 3/'15. On PE, VSS, pt in NAD, she is 100% on RA. Cardiopulm exam benign. She has a localized area of back tenderness on PE. Neuro exam benign including BLLE sensation. Given she is reporting assoc photo/phonophobia, will treat head "burning" as suspected migraine.    CXR nml, h/a resolved w/ migraine cocktail. Will rec scheduled  NSAIDs for back pain & sciatica. I feel SOB related to MSK pain. Doubt PE (PERC negative). WIll d/c home. Return precautions given for new or worsening symptoms including worsening pain, SOB, fever,.          Ernestina Patches, MD 04/26/14 [phone]

## 2014-04-26 NOTE — Discharge Instructions (Signed)
Sciatica °Sciatica is pain, weakness, numbness, or tingling along the path of the sciatic nerve. The nerve starts in the lower back and runs down the back of each leg. The nerve controls the muscles in the lower leg and in the back of the knee, while also providing sensation to the back of the thigh, lower leg, and the sole of your foot. Sciatica is a symptom of another medical condition. For instance, nerve damage or certain conditions, such as a herniated disk or bone spur on the spine, pinch or put pressure on the sciatic nerve. This causes the pain, weakness, or other sensations normally associated with sciatica. Generally, sciatica only affects one side of the body. °CAUSES  °· Herniated or slipped disc. °· Degenerative disk disease. °· A pain disorder involving the narrow muscle in the buttocks (piriformis syndrome). °· Pelvic injury or fracture. °· Pregnancy. °· Tumor (rare). °SYMPTOMS  °Symptoms can vary from mild to very severe. The symptoms usually travel from the low back to the buttocks and down the back of the leg. Symptoms can include: °· Mild tingling or dull aches in the lower back, leg, or hip. °· Numbness in the back of the calf or sole of the foot. °· Burning sensations in the lower back, leg, or hip. °· Sharp pains in the lower back, leg, or hip. °· Leg weakness. °· Severe back pain inhibiting movement. °These symptoms may get worse with coughing, sneezing, laughing, or prolonged sitting or standing. Also, being overweight may worsen symptoms. °DIAGNOSIS  °Your caregiver will perform a physical exam to look for common symptoms of sciatica. He or she may ask you to do certain movements or activities that would trigger sciatic nerve pain. Other tests may be performed to find the cause of the sciatica. These may include: °· Blood tests. °· X-rays. °· Imaging tests, such as an MRI or CT scan. °TREATMENT  °Treatment is directed at the cause of the sciatic pain. Sometimes, treatment is not necessary  and the pain and discomfort goes away on its own. If treatment is needed, your caregiver may suggest: °· Over-the-counter medicines to relieve pain. °· Prescription medicines, such as anti-inflammatory medicine, muscle relaxants, or narcotics. °· Applying heat or ice to the painful area. °· Steroid injections to lessen pain, irritation, and inflammation around the nerve. °· Reducing activity during periods of pain. °· Exercising and stretching to strengthen your abdomen and improve flexibility of your spine. Your caregiver may suggest losing weight if the extra weight makes the back pain worse. °· Physical therapy. °· Surgery to eliminate what is pressing or pinching the nerve, such as a bone spur or part of a herniated disk. °HOME CARE INSTRUCTIONS  °· Only take over-the-counter or prescription medicines for pain or discomfort as directed by your caregiver. °· Apply ice to the affected area for 20 minutes, 3-4 times a day for the first 48-72 hours. Then try heat in the same way. °· Exercise, stretch, or perform your usual activities if these do not aggravate your pain. °· Attend physical therapy sessions as directed by your caregiver. °· Keep all follow-up appointments as directed by your caregiver. °· Do not wear high heels or shoes that do not provide proper support. °· Check your mattress to see if it is too soft. A firm mattress may lessen your pain and discomfort. °SEEK IMMEDIATE MEDICAL CARE IF:  °· You lose control of your bowel or bladder (incontinence). °· You have increasing weakness in the lower back, pelvis, buttocks,   or legs.  You have redness or swelling of your back.  You have a burning sensation when you urinate.  You have pain that gets worse when you lie down or awakens you at night.  Your pain is worse than you have experienced in the past.  Your pain is lasting longer than 4 weeks.  You are suddenly losing weight without reason. MAKE SURE YOU:  Understand these  instructions.  Will watch your condition.  Will get help right away if you are not doing well or get worse. Document Released: 08/02/2001 Document Revised: 02/07/2012 Document Reviewed: 12/18/2011 [facility] Patient Information 2015 [place], [place]. This information is not intended to replace advice given to you by your health care provider. Make sure you discuss any questions you have with your health care provider. Headaches, Frequently Asked Questions MIGRAINE HEADACHES Q: What is migraine? What causes it? How can I treat it? A: Generally, migraine headaches begin as a dull ache. Then they develop into a constant, throbbing, and pulsating pain. You may experience pain at the temples. You may experience pain at the front or back of one or both sides of the head. The pain is usually accompanied by a combination of:  Nausea.  Vomiting.  Sensitivity to light and noise. Some people (about 15%) experience an aura (see below) before an attack. The cause of migraine is believed to be chemical reactions in the brain. Treatment for migraine may include over-the-counter or prescription medications. It may also include self-help techniques. These include relaxation training and biofeedback.  Q: What is an aura? A: About 15% of people with migraine get an "aura". This is a sign of neurological symptoms that occur before a migraine headache. You may see wavy or jagged lines, dots, or flashing lights. You might experience tunnel vision or blind spots in one or both eyes. The aura can include visual or auditory hallucinations (something imagined). It may include disruptions in smell (such as strange odors), taste or touch. Other symptoms include:  Numbness.  A "pins and needles" sensation.  Difficulty in recalling or speaking the correct word. These neurological events may last as long as 60 minutes. These symptoms will fade as the headache begins. Q: What is a trigger? A: Certain physical or  environmental factors can lead to or "trigger" a migraine. These include:  Foods.  Hormonal changes.  Weather.  Stress. It is important to remember that triggers are different for everyone. To help prevent migraine attacks, you need to figure out which triggers affect you. Keep a headache diary. This is a good way to track triggers. The diary will help you talk to your healthcare professional about your condition. Q: Does weather affect migraines? A: Bright sunshine, hot, humid conditions, and drastic changes in barometric pressure may lead to, or "trigger," a migraine attack in some people. But studies have shown that weather does not act as a trigger for everyone with migraines. Q: What is the link between migraine and hormones? A: Hormones start and regulate many of your body's functions. Hormones keep your body in balance within a constantly changing environment. The levels of hormones in your body are unbalanced at times. Examples are during menstruation, pregnancy, or menopause. That can lead to a migraine attack. In fact, about three quarters of all women with migraine report that their attacks are related to the menstrual cycle.  Q: Is there an increased risk of stroke for migraine sufferers? A: The likelihood of a migraine attack causing a stroke is very  remote. That is not to say that migraine sufferers cannot have a stroke associated with their migraines. In persons under age 39, the most common associated factor for stroke is migraine headache. But over the course of a person's normal life span, the occurrence of migraine headache may actually be associated with a reduced risk of dying from cerebrovascular disease due to stroke.  Q: What are acute medications for migraine? A: Acute medications are used to treat the pain of the headache after it has started. Examples over-the-counter medications, NSAIDs, ergots, and triptans.  Q: What are the triptans? A: Triptans are the newest class  of abortive medications. They are specifically targeted to treat migraine. Triptans are vasoconstrictors. They moderate some chemical reactions in the brain. The triptans work on receptors in your brain. Triptans help to restore the balance of a neurotransmitter called serotonin. Fluctuations in levels of serotonin are thought to be a main cause of migraine.  Q: Are over-the-counter medications for migraine effective? A: Over-the-counter, or "OTC," medications may be effective in relieving mild to moderate pain and associated symptoms of migraine. But you should see your caregiver before beginning any treatment regimen for migraine.  Q: What are preventive medications for migraine? A: Preventive medications for migraine are sometimes referred to as "prophylactic" treatments. They are used to reduce the frequency, severity, and length of migraine attacks. Examples of preventive medications include antiepileptic medications, antidepressants, beta-blockers, calcium channel blockers, and NSAIDs (nonsteroidal anti-inflammatory drugs). Q: Why are anticonvulsants used to treat migraine? A: During the past few years, there has been an increased interest in antiepileptic drugs for the prevention of migraine. They are sometimes referred to as "anticonvulsants". Both epilepsy and migraine may be caused by similar reactions in the brain.  Q: Why are antidepressants used to treat migraine? A: Antidepressants are typically used to treat people with depression. They may reduce migraine frequency by regulating chemical levels, such as serotonin, in the brain.  Q: What alternative therapies are used to treat migraine? A: The term "alternative therapies" is often used to describe treatments considered outside the scope of conventional Western medicine. Examples of alternative therapy include acupuncture, acupressure, and yoga. Another common alternative treatment is herbal therapy. Some herbs are believed to relieve  headache pain. Always discuss alternative therapies with your caregiver before proceeding. Some herbal products contain arsenic and other toxins. TENSION HEADACHES Q: What is a tension-type headache? What causes it? How can I treat it? A: Tension-type headaches occur randomly. They are often the result of temporary stress, anxiety, fatigue, or anger. Symptoms include soreness in your temples, a tightening band-like sensation around your head (a "vice-like" ache). Symptoms can also include a pulling feeling, pressure sensations, and contracting head and neck muscles. The headache begins in your forehead, temples, or the back of your head and neck. Treatment for tension-type headache may include over-the-counter or prescription medications. Treatment may also include self-help techniques such as relaxation training and biofeedback. CLUSTER HEADACHES Q: What is a cluster headache? What causes it? How can I treat it? A: Cluster headache gets its name because the attacks come in groups. The pain arrives with little, if any, warning. It is usually on one side of the head. A tearing or bloodshot eye and a runny nose on the same side of the headache may also accompany the pain. Cluster headaches are believed to be caused by chemical reactions in the brain. They have been described as the most severe and intense of any headache type. Treatment for  cluster headache includes prescription medication and oxygen. SINUS HEADACHES Q: What is a sinus headache? What causes it? How can I treat it? A: When a cavity in the bones of the face and skull (a sinus) becomes inflamed, the inflammation will cause localized pain. This condition is usually the result of an allergic reaction, a tumor, or an infection. If your headache is caused by a sinus blockage, such as an infection, you will probably have a fever. An x-ray will confirm a sinus blockage. Your caregiver's treatment might include antibiotics for the infection, as well as  antihistamines or decongestants.  REBOUND HEADACHES Q: What is a rebound headache? What causes it? How can I treat it? A: A pattern of taking acute headache medications too often can lead to a condition known as "rebound headache." A pattern of taking too much headache medication includes taking it more than 2 days per week or in excessive amounts. That means more than the label or a caregiver advises. With rebound headaches, your medications not only stop relieving pain, they actually begin to cause headaches. Doctors treat rebound headache by tapering the medication that is being overused. Sometimes your caregiver will gradually substitute a different type of treatment or medication. Stopping may be a challenge. Regularly overusing a medication increases the potential for serious side effects. Consult a caregiver if you regularly use headache medications more than 2 days per week or more than the label advises. ADDITIONAL QUESTIONS AND ANSWERS Q: What is biofeedback? A: Biofeedback is a self-help treatment. Biofeedback uses special equipment to monitor your body's involuntary physical responses. Biofeedback monitors:  Breathing.  Pulse.  Heart rate.  Temperature.  Muscle tension.  Brain activity. Biofeedback helps you refine and perfect your relaxation exercises. You learn to control the physical responses that are related to stress. Once the technique has been mastered, you do not need the equipment any more. Q: Are headaches hereditary? A: Four out of five (80%) of people that suffer report a family history of migraine. Scientists are not sure if this is genetic or a family predisposition. Despite the uncertainty, a child has a 50% chance of having migraine if one parent suffers. The child has a 75% chance if both parents suffer.  Q: Can children get headaches? A: By the time they reach high school, most young people have experienced some type of headache. Many safe and effective  approaches or medications can prevent a headache from occurring or stop it after it has begun.  Q: What type of doctor should I see to diagnose and treat my headache? A: Start with your primary caregiver. Discuss his or her experience and approach to headaches. Discuss methods of classification, diagnosis, and treatment. Your caregiver may decide to recommend you to a headache specialist, depending upon your symptoms or other physical conditions. Having diabetes, allergies, etc., may require a more comprehensive and inclusive approach to your headache. The National Head[employer] Foundation will provide, upon request, a list of [facility] physician members in your state. Document Released: 10/29/2003 Document Revised: 10/31/2011 Document Reviewed: 04/07/2008 [facility] Patient Information 2015 [place], [place]. This information is not intended to replace advice given to you by your health care provider. Make sure you discuss any questions you have with your health care provider.

## 2014-04-26 NOTE — ED Notes (Signed)
Pt aware of the need for urine sample  

## 2014-04-26 NOTE — ED Notes (Signed)
Pt reports that she started having burning in her head yesterday. She did not take any OTC meds to alleviate pain. Denies nausea, vomiting, or blurred vision or double vision. She is sensitive to light. Lights dimmed for comfort.

## 2014-04-26 NOTE — ED Notes (Signed)
Per pt, states her head is "burning" and has lower back pain with right leg/thigh numb

## 2014-05-22 ENCOUNTER — Encounter (HOSPITAL_COMMUNITY): Payer: Self-pay | Admitting: Emergency Medicine

## 2014-05-22 ENCOUNTER — Emergency Department (HOSPITAL_COMMUNITY)
Admission: EM | Admit: 2014-05-22 | Discharge: 2014-05-22 | Disposition: A | Discharge status: Home / Self Care | Payer: Medicaid Other | Attending: Emergency Medicine | Admitting: Emergency Medicine

## 2014-05-22 DIAGNOSIS — Z791 Long term (current) use of non-steroidal anti-inflammatories (NSAID): Secondary | ICD-10-CM | POA: Insufficient documentation

## 2014-05-22 DIAGNOSIS — Z8619 Personal history of other infectious and parasitic diseases: Secondary | ICD-10-CM | POA: Diagnosis not present

## 2014-05-22 DIAGNOSIS — J029 Acute pharyngitis, unspecified: Secondary | ICD-10-CM | POA: Diagnosis not present

## 2014-05-22 DIAGNOSIS — R05 Cough: Secondary | ICD-10-CM | POA: Insufficient documentation

## 2014-05-22 DIAGNOSIS — R058 Other specified cough: Secondary | ICD-10-CM

## 2014-05-22 DIAGNOSIS — F329 Major depressive disorder, single episode, unspecified: Secondary | ICD-10-CM | POA: Insufficient documentation

## 2014-05-22 DIAGNOSIS — R0981 Nasal congestion: Secondary | ICD-10-CM | POA: Diagnosis not present

## 2014-05-22 DIAGNOSIS — Z87891 Personal history of nicotine dependence: Secondary | ICD-10-CM | POA: Insufficient documentation

## 2014-05-22 DIAGNOSIS — Z8659 Personal history of other mental and behavioral disorders: Secondary | ICD-10-CM | POA: Diagnosis not present

## 2014-05-22 DIAGNOSIS — Z79899 Other long term (current) drug therapy: Secondary | ICD-10-CM | POA: Insufficient documentation

## 2014-05-22 DIAGNOSIS — R51 Headache: Secondary | ICD-10-CM | POA: Diagnosis not present

## 2014-05-22 LAB — RAPID STREP SCREEN (MED CTR MEBANE ONLY): Streptococcus, Group A Screen (Direct): NEGATIVE

## 2014-05-22 MED ORDER — IBUPROFEN 200 MG PO TABS
600.0000 mg | ORAL_TABLET | Freq: Once | ORAL | Status: AC
  Administered 2014-05-22: 600 mg via ORAL
  Filled 2014-05-22: qty 3

## 2014-05-22 MED ORDER — ACETAMINOPHEN 325 MG PO TABS
650.0000 mg | ORAL_TABLET | Freq: Once | ORAL | Status: AC
  Administered 2014-05-22: 650 mg via ORAL
  Filled 2014-05-22: qty 2

## 2014-05-22 NOTE — ED Notes (Signed)
MD at bedside. 

## 2014-05-22 NOTE — Discharge Instructions (Signed)
Rest. Drink plenty of fluids. Take tylenol every 4 hours and motrin every 6 hours as need for fever and/or pain. You may use throat lozenges as need for symptom relief. You may take mucinex as need for cough and congestion. Follow up with primary care doctor in 3-4 days for recheck if symptoms fail to improve/resolve. Return to ER right away if worse, new symptoms, trouble breathing, unable to swallow, worsening one-sided throat pain and swelling, other concern.     Sore Throat A sore throat is pain, burning, irritation, or scratchiness of the throat. There is often pain or tenderness when swallowing or talking. A sore throat may be accompanied by other symptoms, such as coughing, sneezing, fever, and swollen neck glands. A sore throat is often the first sign of another sickness, such as a cold, flu, strep throat, or mononucleosis (commonly known as mono). Most sore throats go away without medical treatment. CAUSES  The most common causes of a sore throat include:  A viral infection, such as a cold, flu, or mono.  A bacterial infection, such as strep throat, tonsillitis, or whooping cough.  Seasonal allergies.  Dryness in the air.  Irritants, such as smoke or pollution.  Gastroesophageal reflux disease (GERD). HOME CARE INSTRUCTIONS   Only take over-the-counter medicines as directed by your caregiver.  Drink enough fluids to keep your urine clear or pale yellow.  Rest as needed.  Try using throat sprays, lozenges, or sucking on hard candy to ease any pain (if older than 4 years or as directed).  Sip warm liquids, such as broth, herbal tea, or warm water with honey to relieve pain temporarily. You may also eat or drink cold or frozen liquids such as frozen ice pops.  Gargle with salt water (mix 1 tsp salt with 8 oz of water).  Do not smoke and avoid secondhand smoke.  Put a cool-mist humidifier in your bedroom at night to moisten the air. You can also turn on a hot shower and  sit in the bathroom with the door closed for 5-10 minutes. SEEK IMMEDIATE MEDICAL CARE IF:  You have difficulty breathing.  You are unable to swallow fluids, soft foods, or your saliva.  You have increased swelling in the throat.  Your sore throat does not get better in 7 days.  You have nausea and vomiting.  You have a fever or persistent symptoms for more than 2-3 days.  You have a fever and your symptoms suddenly get worse. MAKE SURE YOU:   Understand these instructions.  Will watch your condition.  Will get help right away if you are not doing well or get worse. Document Released: 09/15/2004 Document Revised: 07/25/2012 Document Reviewed: 04/15/2012 [facility] Patient Information 2015 [place], [place]. This information is not intended to replace advice given to you by your health care provider. Make sure you discuss any questions you have with your health care provider.    Pharyngitis Pharyngitis is redness, pain, and swelling (inflammation) of your pharynx.  CAUSES  Pharyngitis is usually caused by infection. Most of the time, these infections are from viruses (viral) and are part of a cold. However, sometimes pharyngitis is caused by bacteria (bacterial). Pharyngitis can also be caused by allergies. Viral pharyngitis may be spread from person to person by coughing, sneezing, and personal items or utensils (cups, forks, spoons, toothbrushes). Bacterial pharyngitis may be spread from person to person by more intimate contact, such as kissing.  SIGNS AND SYMPTOMS  Symptoms of pharyngitis include:   Sore  throat.   Tiredness (fatigue).   Low-grade fever.   Headache.  Joint pain and muscle aches.  Skin rashes.  Swollen lymph nodes.  Plaque-like film on throat or tonsils (often seen with bacterial pharyngitis). DIAGNOSIS  Your health care provider will ask you questions about your illness and your symptoms. Your medical history, along with a physical exam, is often  all that is needed to diagnose pharyngitis. Sometimes, a rapid strep test is done. Other lab tests may also be done, depending on the suspected cause.  TREATMENT  Viral pharyngitis will usually get better in 3-4 days without the use of medicine. Bacterial pharyngitis is treated with medicines that kill germs (antibiotics).  HOME CARE INSTRUCTIONS   Drink enough water and fluids to keep your urine clear or pale yellow.   Only take over-the-counter or prescription medicines as directed by your health care provider:   If you are prescribed antibiotics, make sure you finish them even if you start to feel better.   Do not take aspirin.   Get lots of rest.   Gargle with 8 oz of salt water ( tsp of salt per 1 qt of water) as often as every 1-2 hours to soothe your throat.   Throat lozenges (if you are not at risk for choking) or sprays may be used to soothe your throat. SEEK MEDICAL CARE IF:   You have large, tender lumps in your neck.  You have a rash.  You cough up green, yellow-brown, or bloody spit. SEEK IMMEDIATE MEDICAL CARE IF:   Your neck becomes stiff.  You drool or are unable to swallow liquids.  You vomit or are unable to keep medicines or liquids down.  You have severe pain that does not go away with the use of recommended medicines.  You have trouble breathing (not caused by a stuffy nose). MAKE SURE YOU:   Understand these instructions.  Will watch your condition.  Will get help right away if you are not doing well or get worse. Document Released: 08/08/2005 Document Revised: 05/29/2013 Document Reviewed: 04/15/2013 [facility] Patient Information 2015 [place], [place]. This information is not intended to replace advice given to you by your health care provider. Make sure you discuss any questions you have with your health care provider.    Cough, Adult  A cough is a reflex that helps clear your throat and airways. It can help heal the body or may be a  reaction to an irritated airway. A cough may only last 2 or 3 weeks (acute) or may last more than 8 weeks (chronic).  CAUSES Acute cough:  Viral or bacterial infections. Chronic cough:  Infections.  Allergies.  Asthma.  Post-nasal drip.  Smoking.  Heartburn or acid reflux.  Some medicines.  Chronic lung problems (COPD).  Cancer. SYMPTOMS   Cough.  Fever.  Chest pain.  Increased breathing rate.  High-pitched whistling sound when breathing (wheezing).  Colored mucus that you cough up (sputum). TREATMENT   A bacterial cough may be treated with antibiotic medicine.  A viral cough must run its course and will not respond to antibiotics.  Your caregiver may recommend other treatments if you have a chronic cough. HOME CARE INSTRUCTIONS   Only take over-the-counter or prescription medicines for pain, discomfort, or fever as directed by your caregiver. Use cough suppressants only as directed by your caregiver.  Use a cold steam vaporizer or humidifier in your bedroom or home to help loosen secretions.  Sleep in a semi-upright position if  your cough is worse at night.  Rest as needed.  Stop smoking if you smoke. SEEK IMMEDIATE MEDICAL CARE IF:   You have pus in your sputum.  Your cough starts to worsen.  You cannot control your cough with suppressants and are losing sleep.  You begin coughing up blood.  You have difficulty breathing.  You develop pain which is getting worse or is uncontrolled with medicine.  You have a fever. MAKE SURE YOU:   Understand these instructions.  Will watch your condition.  Will get help right away if you are not doing well or get worse. Document Released: 02/04/2011 Document Revised: 10/31/2011 Document Reviewed: 02/04/2011 [facility] Patient Information 2015 [place], [place]. This information is not intended to replace advice given to you by your health care provider. Make sure you discuss any questions you have with your  health care provider.

## 2014-05-22 NOTE — ED Provider Notes (Addendum)
CSN: [id number]     Arrival date & time 05/22/14  2007 History   First MD Initiated Contact with Patient 05/22/14 2032     Chief Complaint  Patient presents with  . Headache  . Sore Throat     (Consider location/radiation/quality/duration/timing/severity/associated sxs/prior Treatment) Patient is a 23 y.o. female presenting with headaches and pharyngitis. The history is provided by the patient.  Headache Associated symptoms: cough, fever and sore throat   Associated symptoms: no abdominal pain, no back pain, no diarrhea, no neck pain, no numbness, no sinus pressure and no vomiting   Sore Throat Associated symptoms include headaches. Pertinent negatives include no chest pain, no abdominal pain and no shortness of breath.  pt c/o sore throat for the past couple days. Bilateral/diffuse. No unilateral throat pain or swelling. No trouble breathing or swallowing. Also notes recent mild nasal congestion and non prod cough. No abd pain. No nvd. No dysuria or gu c/o. No known mono or strep exposure, no known ill contacts.      Past Medical History  Diagnosis Date  . Panic attack   . Obesity   . Depression   . Trichomonal vaginitis    Past Surgical History  Procedure Laterality Date  . Mouth surgery    . Tonsillectomy    . Addenoidectomy     Family History  Problem Relation Age of Onset  . Diabetes Other   . Diabetes Mother   . HIV Mother    History  Substance Use Topics  . Smoking status: Former Smoker    Types: Cigarettes    Quit date: 12/25/2012  . Smokeless tobacco: Never Used  . Alcohol Use: No     Comment: rare   OB History   Grav Para Term Preterm Abortions TAB SAB Ect Mult Living   2 2 2  0 0 0 0 0 0 2     Review of Systems  Constitutional: Positive for fever. Negative for chills and diaphoresis.  HENT: Positive for sore throat. Negative for sinus pressure.   Eyes: Negative for discharge and redness.  Respiratory: Positive for cough. Negative for shortness of  breath.   Cardiovascular: Negative for chest pain and leg swelling.  Gastrointestinal: Negative for vomiting, abdominal pain and diarrhea.  Endocrine: Negative for polyuria.  Genitourinary: Negative for dysuria and flank pain.  Musculoskeletal: Negative for back pain and neck pain.  Skin: Negative for rash.  Neurological: Positive for headaches. Negative for weakness and numbness.  Hematological: Does not bruise/bleed easily.  Psychiatric/Behavioral: Negative for confusion.      Allergies  Review of patient's allergies indicates no known allergies.  Home Medications   Prior to Admission medications   Medication Sig Start Date End Date Taking? Authorizing Provider  etonogestrel (IMPLANON) 68 MG IMPL implant Inject 1 each into the skin once.   Yes Historical Provider, MD  naproxen (NAPROSYN) 500 MG tablet Take 1 tablet (500 mg total) by mouth 2 (two) times daily. 04/26/14  Yes Ernestina Patches, MD  Pseudoephedrine-Ibuprofen (ADVIL COLD/SINUS PO) Take 2 tablets by mouth every 6 (six) hours as needed (sinuses).   Yes Historical Provider, MD  traMADol (ULTRAM) 50 MG tablet Take 50 mg by mouth every 6 (six) hours as needed for moderate pain.   Yes Historical Provider, MD   BP 119/82  Pulse 128  Temp(Src) 101.7 F (38.7 C) (Oral)  Resp 20  Ht 5\' 10"  (1.778 m)  Wt 244 lb (110.678 kg)  BMI 35.01 kg/m2  SpO2 99% Physical Exam  Nursing note and vitals reviewed. Constitutional: She is oriented to person, place, and time. She appears well-developed and well-nourished. No distress.  HENT:  Mouth/Throat: Oropharynx is clear and moist. No oropharyngeal exudate.  Pharynx erythematous, no asymmetric swelling or abscess. No trismus.   Eyes: Conjunctivae are normal. Pupils are equal, round, and reactive to light. No scleral icterus.  Neck: Normal range of motion. Neck supple. No tracheal deviation present.  No stiffness or rigidity  Cardiovascular: Normal rate, regular rhythm, normal heart sounds  and intact distal pulses.  Exam reveals no gallop and no friction rub.   No murmur heard. Pulmonary/Chest: Effort normal and breath sounds normal. No respiratory distress.  Abdominal: Soft. Normal appearance and bowel sounds are normal. She exhibits no distension and no mass. There is no tenderness. There is no rebound and no guarding.  No hsm.   Genitourinary:  No cva tenderness  Musculoskeletal: Normal range of motion. She exhibits no edema and no tenderness.  Lymphadenopathy:    She has no cervical adenopathy.  Neurological: She is alert and oriented to person, place, and time.  Steady gait.   Skin: Skin is warm and dry. No rash noted. She is not diaphoretic.  Psychiatric: She has a normal mood and affect.    ED Course  Procedures (including critical care time) Labs Review  Results for orders placed during the hospital encounter of 05/22/14  RAPID STREP SCREEN      Result Value Ref Range   Streptococcus, Group A Screen (Direct) NEGATIVE  NEGATIVE      MDM  No meds for fever since advil early this morning.  Motrin po.  Po fluids.  Strep screen.  Reviewed nursing notes and prior charts for additional history.   Recheck, tolerating po fluids. No increased wob.  abd soft nt.   Given nasal congestion, cough, sore throat, feel most c/w viral uri.  Pt currently appears stable for d/c.      Mirna Mires, MD 05/22/14 2251

## 2014-05-22 NOTE — ED Notes (Signed)
Pt reports sore throat, congestion, h/a and neck pain x 3 days.

## 2014-05-24 LAB — CULTURE, GROUP A STREP

## 2014-06-23 ENCOUNTER — Encounter (HOSPITAL_COMMUNITY): Payer: Self-pay | Admitting: Emergency Medicine

## 2014-07-10 ENCOUNTER — Encounter (HOSPITAL_COMMUNITY): Payer: Self-pay | Admitting: Emergency Medicine

## 2014-07-10 ENCOUNTER — Emergency Department (HOSPITAL_COMMUNITY)
Admission: EM | Admit: 2014-07-10 | Discharge: 2014-07-10 | Disposition: A | Discharge status: Home / Self Care | Payer: Medicaid Other | Attending: Emergency Medicine | Admitting: Emergency Medicine

## 2014-07-10 DIAGNOSIS — Z79899 Other long term (current) drug therapy: Secondary | ICD-10-CM | POA: Insufficient documentation

## 2014-07-10 DIAGNOSIS — Z87891 Personal history of nicotine dependence: Secondary | ICD-10-CM | POA: Insufficient documentation

## 2014-07-10 DIAGNOSIS — E669 Obesity, unspecified: Secondary | ICD-10-CM | POA: Diagnosis not present

## 2014-07-10 DIAGNOSIS — M545 Low back pain: Secondary | ICD-10-CM | POA: Diagnosis present

## 2014-07-10 DIAGNOSIS — M5417 Radiculopathy, lumbosacral region: Secondary | ICD-10-CM | POA: Diagnosis not present

## 2014-07-10 DIAGNOSIS — M5431 Sciatica, right side: Secondary | ICD-10-CM | POA: Diagnosis not present

## 2014-07-10 DIAGNOSIS — Z8619 Personal history of other infectious and parasitic diseases: Secondary | ICD-10-CM | POA: Diagnosis not present

## 2014-07-10 DIAGNOSIS — Z791 Long term (current) use of non-steroidal anti-inflammatories (NSAID): Secondary | ICD-10-CM | POA: Insufficient documentation

## 2014-07-10 DIAGNOSIS — Z8659 Personal history of other mental and behavioral disorders: Secondary | ICD-10-CM | POA: Diagnosis not present

## 2014-07-10 MED ORDER — METHOCARBAMOL 500 MG PO TABS: 1000.0000 mg | ORAL_TABLET | Freq: Four times a day (QID) | ORAL | Status: DC

## 2014-07-10 MED ORDER — HYDROCODONE-ACETAMINOPHEN 5-325 MG PO TABS: ORAL_TABLET | ORAL | Status: DC

## 2014-07-10 MED ORDER — HYDROCODONE-ACETAMINOPHEN 5-325 MG PO TABS
1.0000 | ORAL_TABLET | Freq: Once | ORAL | Status: AC
  Administered 2014-07-10: 1 via ORAL
  Filled 2014-07-10: qty 1

## 2014-07-10 MED ORDER — IBUPROFEN 600 MG PO TABS: 600.0000 mg | ORAL_TABLET | Freq: Four times a day (QID) | ORAL | Status: DC | PRN

## 2014-07-10 MED ORDER — METHOCARBAMOL 500 MG PO TABS
1000.0000 mg | ORAL_TABLET | Freq: Once | ORAL | Status: AC
  Administered 2014-07-10: 1000 mg via ORAL
  Filled 2014-07-10: qty 2

## 2014-07-10 NOTE — Discharge Instructions (Signed)
Please read and follow all provided instructions.  Your diagnoses today include:  1. Lumbosacral radiculopathy     Tests performed today include:  Vital signs - see below for your results today  Medications prescribed:   Vicodin (hydrocodone/acetaminophen) - narcotic pain medication  DO NOT drive or perform any activities that require you to be awake and alert because this medicine can make you drowsy. BE VERY CAREFUL not to take multiple medicines containing Tylenol (also called acetaminophen). Doing so can lead to an overdose which can damage your liver and cause liver failure and possibly death.   Robaxin (methocarbamol) - muscle relaxer medication  DO NOT drive or perform any activities that require you to be awake and alert because this medicine can make you drowsy.    Naproxen - anti-inflammatory pain medication  Do not exceed 500mg  naproxen every 12 hours, take with food  You have been prescribed an anti-inflammatory medication or NSAID. Take with food. Take smallest effective dose for the shortest duration needed for your pain. Stop taking if you experience stomach pain or vomiting.   Take any prescribed medications only as directed.  Home care instructions:   Follow any educational materials contained in this packet  Please rest, use ice or heat on your back for the next several days  Do not lift, push, pull anything more than 10 pounds for the next week  Follow-up instructions: Please follow-up with your primary care provider in the next 1 week for further evaluation of your symptoms.   Return instructions:  SEEK IMMEDIATE MEDICAL ATTENTION IF YOU HAVE:  New numbness, tingling, weakness, or problem with the use of your arms or legs  Severe back pain not relieved with medications  Loss control of your bowels or bladder  Increasing pain in any areas of the body (such as chest or abdominal pain)  Shortness of breath, dizziness, or fainting.   Worsening  nausea (feeling sick to your stomach), vomiting, fever, or sweats  Any other emergent concerns regarding your health   Additional Information:  Your vital signs today were: BP 106/64 mmHg   Pulse 82   Temp(Src) 98.4 F (36.9 C) (Oral)   Resp 18   Ht 5\' 10"  (1.778 m)   Wt 250 lb (113.399 kg)   BMI 35.87 kg/m2   SpO2 98% If your blood pressure (BP) was elevated above 135/85 this visit, please have this repeated by your doctor within one month. --------------

## 2014-07-10 NOTE — ED Notes (Signed)
Pt presents with right lower back pain radiating down R leg with burning/numbness to to R thigh. Pt reports numbness to R foot. Pt states she has been seen for this in the past, naproxen hurts her stomach and flexeril causes cramping.

## 2014-07-10 NOTE — ED Provider Notes (Signed)
CSN: [id number]     Arrival date & time 07/10/14  0420 History   First MD Initiated Contact with Patient 07/10/14 0559     Chief Complaint  Patient presents with  . Back Pain     (Consider location/radiation/quality/duration/timing/severity/associated sxs/prior Treatment) HPI Comments: Patient with history of right-sided sciatica presents with one-day history of right lower back pain with radiation into her right leg with associated tingling in her right leg. Patient denies injury at onset. Patient states that the symptoms are exactly like her previous symptoms. She has been seen in emergency department for this several times but has not had any additional follow-up or evaluation. No treatments prior to arrival. Patient states that she stands a lot at work. Patient denies warning symptoms of back pain including: fecal incontinence, urinary retention or overflow incontinence, night sweats, waking from sleep with back pain, unexplained fevers or weight loss, h/o cancer, IVDU, recent trauma.    The history is provided by the patient.    Past Medical History  Diagnosis Date  . Panic attack   . Obesity   . Depression   . Trichomonal vaginitis    Past Surgical History  Procedure Laterality Date  . Mouth surgery    . Tonsillectomy    . Addenoidectomy     Family History  Problem Relation Age of Onset  . Diabetes Other   . Diabetes Mother   . HIV Mother    History  Substance Use Topics  . Smoking status: Former Smoker    Types: Cigarettes    Quit date: 12/25/2012  . Smokeless tobacco: Never Used  . Alcohol Use: No     Comment: rare   OB History    Gravida Para Term Preterm AB TAB SAB Ectopic Multiple Living   2 2 2  0 0 0 0 0 0 2     Review of Systems  Constitutional: Negative for fever and unexpected weight change.  Gastrointestinal: Negative for constipation.       Negative for fecal incontinence.   Genitourinary: Negative for dysuria, hematuria, flank pain, vaginal  bleeding, vaginal discharge and pelvic pain.       Negative for urinary incontinence or retention.  Musculoskeletal: Positive for back pain.  Neurological: Positive for numbness (tingling). Negative for weakness.       Denies saddle paresthesias.    Allergies  Review of patient's allergies indicates no known allergies.  Home Medications   Prior to Admission medications   Medication Sig Start Date End Date Taking? Authorizing Provider  etonogestrel (IMPLANON) 68 MG IMPL implant Inject 1 each into the skin once.   Yes Historical Provider, MD  Exenatide ER (BYDUREON) 2 MG PEN Inject 1 each into the skin once a week. On wednesday   Yes Historical Provider, MD  ibuprofen (ADVIL,MOTRIN) 200 MG tablet Take 800 mg by mouth every 6 (six) hours as needed for moderate pain.   Yes Historical Provider, MD  metFORMIN (GLUCOPHAGE) 500 MG tablet Take 500 mg by mouth daily with breakfast.   Yes Historical Provider, MD  naproxen (NAPROSYN) 500 MG tablet Take 1 tablet (500 mg total) by mouth 2 (two) times daily. 04/26/14  Yes Ernestina Patches, MD   BP 106/64 mmHg  Pulse 82  Temp(Src) 98.4 F (36.9 C) (Oral)  Resp 18  Ht 5\' 10"  (1.778 m)  Wt 250 lb (113.399 kg)  BMI 35.87 kg/m2  SpO2 98%   Physical Exam  Constitutional: She appears well-developed and well-nourished.  HENT:  Head: Normocephalic  and atraumatic.  Eyes: Conjunctivae are normal.  Neck: Normal range of motion. Neck supple.  Pulmonary/Chest: Effort normal.  Abdominal: Soft. There is no tenderness. There is no CVA tenderness.  Musculoskeletal: Normal range of motion.       Right hip: Normal.       Left hip: Normal.       Cervical back: She exhibits normal range of motion, no tenderness and no bony tenderness.       Thoracic back: She exhibits normal range of motion, no tenderness and no bony tenderness.       Lumbar back: She exhibits tenderness. She exhibits no bony tenderness and no swelling.       Back:  No step-off noted with  palpation of spine.   Neurological: She is alert. She has normal strength and normal reflexes. No sensory deficit.  5/5 strength in entire lower extremities bilaterally. No sensation deficit.   Skin: Skin is warm and dry. No rash noted.  Psychiatric: She has a normal mood and affect.  Nursing note and vitals reviewed.   ED Course  Procedures (including critical care time) Labs Review Labs Reviewed - No data to display  Imaging Review No results found.   EKG Interpretation None       6:32 AM Patient seen and examined. Medications ordered.   Vital signs reviewed and are as follows: Filed Vitals:   07/10/14 0427  BP: 106/64  Pulse: 82  Temp: 98.4 F (36.9 C)  Resp: 18    No red flag s/s of low back pain. Patient was counseled on back pain precautions and told to do activity as tolerated but do not lift, push, or pull heavy objects more than 10 pounds for the next week.  Patient counseled to use ice or heat on back for no longer than 15 minutes every hour.   Patient prescribed muscle relaxer and counseled on proper use of muscle relaxant medication.    Patient prescribed narcotic pain medicine and counseled on proper use of narcotic pain medications. Counseled not to combine this medication with others containing tylenol.   Urged patient not to drink alcohol, drive, or perform any other activities that requires focus while taking either of these medications.  Patient urged to follow-up with PCP if pain does not improve with treatment and rest or if pain becomes recurrent. Urged to return with worsening severe pain, loss of bowel or bladder control, trouble walking.   The patient verbalizes understanding and agrees with the plan.   MDM   Final diagnoses:  Lumbosacral radiculopathy   Patient with back pain, same as previous sciatica pain. No concerning neurological deficits. Patient is ambulatory. No warning symptoms of back pain including: fecal incontinence, urinary  retention or overflow incontinence, night sweats, waking from sleep with back pain, unexplained fevers or weight loss, h/o cancer, IVDU, recent trauma. No concern for cauda equina, epidural abscess, or other serious cause of back pain. Conservative measures such as rest, ice/heat and pain medicine indicated with PCP follow-up if no improvement with conservative management.      Carlisle Cater, PA-C 07/10/14 [place], MD 07/10/14 [phone]

## 2014-08-26 ENCOUNTER — Ambulatory Visit: Payer: Medicaid Other | Attending: Orthopedic Surgery | Admitting: Physical Therapy

## 2014-08-26 DIAGNOSIS — M5116 Intervertebral disc disorders with radiculopathy, lumbar region: Secondary | ICD-10-CM | POA: Insufficient documentation

## 2014-08-26 DIAGNOSIS — M545 Low back pain: Secondary | ICD-10-CM | POA: Insufficient documentation

## 2014-09-17 ENCOUNTER — Ambulatory Visit: Payer: Medicaid Other | Admitting: Physical Therapy

## 2014-09-17 DIAGNOSIS — M5116 Intervertebral disc disorders with radiculopathy, lumbar region: Secondary | ICD-10-CM | POA: Diagnosis not present

## 2014-09-17 DIAGNOSIS — M545 Low back pain: Secondary | ICD-10-CM | POA: Diagnosis not present

## 2014-09-19 ENCOUNTER — Encounter (HOSPITAL_COMMUNITY): Payer: Self-pay | Admitting: Emergency Medicine

## 2014-09-19 ENCOUNTER — Emergency Department (HOSPITAL_COMMUNITY)
Admission: EM | Admit: 2014-09-19 | Discharge: 2014-09-19 | Disposition: A | Discharge status: Home / Self Care | Payer: Medicaid Other | Attending: Emergency Medicine | Admitting: Emergency Medicine

## 2014-09-19 DIAGNOSIS — Z8659 Personal history of other mental and behavioral disorders: Secondary | ICD-10-CM | POA: Diagnosis not present

## 2014-09-19 DIAGNOSIS — H9209 Otalgia, unspecified ear: Secondary | ICD-10-CM | POA: Diagnosis not present

## 2014-09-19 DIAGNOSIS — Z87891 Personal history of nicotine dependence: Secondary | ICD-10-CM | POA: Insufficient documentation

## 2014-09-19 DIAGNOSIS — R51 Headache: Secondary | ICD-10-CM | POA: Diagnosis present

## 2014-09-19 DIAGNOSIS — Z79899 Other long term (current) drug therapy: Secondary | ICD-10-CM | POA: Insufficient documentation

## 2014-09-19 DIAGNOSIS — Z8619 Personal history of other infectious and parasitic diseases: Secondary | ICD-10-CM | POA: Diagnosis not present

## 2014-09-19 DIAGNOSIS — E669 Obesity, unspecified: Secondary | ICD-10-CM | POA: Insufficient documentation

## 2014-09-19 DIAGNOSIS — Z791 Long term (current) use of non-steroidal anti-inflammatories (NSAID): Secondary | ICD-10-CM | POA: Diagnosis not present

## 2014-09-19 DIAGNOSIS — J0100 Acute maxillary sinusitis, unspecified: Secondary | ICD-10-CM | POA: Diagnosis not present

## 2014-09-19 LAB — RAPID STREP SCREEN (MED CTR MEBANE ONLY): Streptococcus, Group A Screen (Direct): NEGATIVE

## 2014-09-19 MED ORDER — AMOXICILLIN-POT CLAVULANATE 875-125 MG PO TABS: 1.0000 | ORAL_TABLET | Freq: Two times a day (BID) | ORAL | Status: DC

## 2014-09-19 NOTE — ED Provider Notes (Signed)
CSN: [id number]     Arrival date & time 09/19/14  1728 History  This chart was scribed for Fransico Meadow, PA-C working with Dorie Rank, MD by Randa Evens, ED Scribe. This patient was seen in room [place] and the patient's care was started at 6:21 PM.    Chief Complaint  Patient presents with  . Sore Throat  . Headache   Patient is a 24 y.o. female presenting with pharyngitis and headaches. The history is provided by the patient. No language interpreter was used.  Sore Throat Associated symptoms include headaches.  Headache Associated symptoms: ear pain, sinus pressure and sore throat    HPI Comments: Phyllis Dalton is a 24 y.o. female who presents to the Emergency Department complaining of new sore throat onset 2 days prior. Pt states she has associated ear pain and sinus pressure. Pt states she has headache with associated photophobia. Pt states she has tried cough medicine and advil with no relief. Pt states she does take robaxin and prednisone daily. Pt states she recently quit smoking about 3-4 months ago. Pt doesn't report any other symptoms.    Past Medical History  Diagnosis Date  . Panic attack   . Obesity   . Depression   . Trichomonal vaginitis    Past Surgical History  Procedure Laterality Date  . Mouth surgery    . Tonsillectomy    . Addenoidectomy     Family History  Problem Relation Age of Onset  . Diabetes Other   . Diabetes Mother   . HIV Mother    History  Substance Use Topics  . Smoking status: Former Smoker    Types: Cigarettes    Quit date: 12/25/2012  . Smokeless tobacco: Never Used  . Alcohol Use: No     Comment: rare   OB History    Gravida Para Term Preterm AB TAB SAB Ectopic Multiple Living   2 2 2  0 0 0 0 0 0 2     Review of Systems  HENT: Positive for ear pain, sinus pressure and sore throat.   Neurological: Positive for headaches.    Allergies  Review of patient's allergies indicates no known allergies.  Home Medications    Prior to Admission medications   Medication Sig Start Date End Date Taking? Authorizing Provider  etonogestrel (IMPLANON) 68 MG IMPL implant Inject 1 each into the skin once.    Historical Provider, MD  Exenatide ER (BYDUREON) 2 MG PEN Inject 1 each into the skin once a week. On wednesday    Historical Provider, MD  HYDROcodone-acetaminophen (NORCO/VICODIN) 5-325 MG per tablet Take 1-2 tablets every 6 hours as needed for severe pain 07/10/14   Carlisle Cater, PA-C  ibuprofen (ADVIL,MOTRIN) 600 MG tablet Take 1 tablet (600 mg total) by mouth every 6 (six) hours as needed. 07/10/14   Carlisle Cater, PA-C  metFORMIN (GLUCOPHAGE) 500 MG tablet Take 500 mg by mouth daily with breakfast.    Historical Provider, MD  methocarbamol (ROBAXIN) 500 MG tablet Take 2 tablets (1,000 mg total) by mouth 4 (four) times daily. 07/10/14   Carlisle Cater, PA-C  naproxen (NAPROSYN) 500 MG tablet Take 1 tablet (500 mg total) by mouth 2 (two) times daily. 04/26/14   Ernestina Patches, MD   BP 107/77 mmHg  Pulse 112  Temp(Src) 99.2 F (37.3 C) (Oral)  Resp 17  SpO2 100%   Physical Exam  Constitutional: She appears well-developed and well-nourished.  HENT:  Head: Normocephalic and atraumatic.  Nose:  Right sinus exhibits maxillary sinus tenderness. Left sinus exhibits maxillary sinus tenderness.  Mouth/Throat: Posterior oropharyngeal erythema present.  Eyes: Conjunctivae are normal. Right eye exhibits no discharge. Left eye exhibits no discharge.  Pulmonary/Chest: Effort normal. No respiratory distress.  Neurological: She is alert. Coordination normal.  Skin: Skin is warm and dry. No rash noted. She is not diaphoretic. No erythema.  Psychiatric: She has a normal mood and affect.  Nursing note and vitals reviewed.   ED Course  Procedures (including critical care time) DIAGNOSTIC STUDIES: Oxygen Saturation is 100% on RA, normal by my interpretation.    COORDINATION OF CARE: 6:35 PM-Discussed treatment plan with  pt at bedside and pt agreed to plan.    Labs Review Labs Reviewed  RAPID STREP SCREEN  CULTURE, GROUP A STREP   Negative strep Imaging Review No results found.   EKG Interpretation None      MDM   Final diagnoses:  Acute maxillary sinusitis, recurrence not specified     Augmentin rx Follow up with Dr. Criss Rosales for recheck in 1 week if symptoms persist    Fransico Meadow, PA-C 09/19/14 [place], PA-C 09/19/14 1907  Dorie Rank, MD 09/20/14 [phone]

## 2014-09-19 NOTE — Discharge Instructions (Signed)

## 2014-09-19 NOTE — ED Notes (Signed)
Pt c/o sore throat with bilateral ear pain x2 days. Has had a headache x1 week. Has been taking cough medicine and Advil with no alleviation of symptoms. RR even/unlabored. No other issues/concerns.

## 2014-09-21 LAB — CULTURE, GROUP A STREP

## 2014-10-24 ENCOUNTER — Encounter (HOSPITAL_COMMUNITY): Payer: Self-pay | Admitting: Emergency Medicine

## 2014-10-24 ENCOUNTER — Emergency Department (HOSPITAL_COMMUNITY)
Admission: EM | Admit: 2014-10-24 | Discharge: 2014-10-24 | Disposition: A | Discharge status: Home / Self Care | Payer: Medicaid Other | Attending: Emergency Medicine | Admitting: Emergency Medicine

## 2014-10-24 DIAGNOSIS — Z791 Long term (current) use of non-steroidal anti-inflammatories (NSAID): Secondary | ICD-10-CM | POA: Insufficient documentation

## 2014-10-24 DIAGNOSIS — B349 Viral infection, unspecified: Secondary | ICD-10-CM | POA: Insufficient documentation

## 2014-10-24 DIAGNOSIS — Z79899 Other long term (current) drug therapy: Secondary | ICD-10-CM | POA: Insufficient documentation

## 2014-10-24 DIAGNOSIS — Z8619 Personal history of other infectious and parasitic diseases: Secondary | ICD-10-CM | POA: Insufficient documentation

## 2014-10-24 DIAGNOSIS — Z87448 Personal history of other diseases of urinary system: Secondary | ICD-10-CM | POA: Insufficient documentation

## 2014-10-24 DIAGNOSIS — Z87891 Personal history of nicotine dependence: Secondary | ICD-10-CM | POA: Insufficient documentation

## 2014-10-24 DIAGNOSIS — E669 Obesity, unspecified: Secondary | ICD-10-CM | POA: Insufficient documentation

## 2014-10-24 DIAGNOSIS — Z3202 Encounter for pregnancy test, result negative: Secondary | ICD-10-CM | POA: Insufficient documentation

## 2014-10-24 DIAGNOSIS — Z8659 Personal history of other mental and behavioral disorders: Secondary | ICD-10-CM | POA: Insufficient documentation

## 2014-10-24 LAB — CBC WITH DIFFERENTIAL/PLATELET
Basophils Absolute: 0.1 10*3/uL (ref 0.0–0.1)
Basophils Relative: 1 % (ref 0–1)
Eosinophils Absolute: 0.2 10*3/uL (ref 0.0–0.7)
Eosinophils Relative: 1 % (ref 0–5)
HCT: 43.6 % (ref 36.0–46.0)
Hemoglobin: 13.8 g/dL (ref 12.0–15.0)
Lymphocytes Relative: 29 % (ref 12–46)
Lymphs Abs: 3.1 10*3/uL (ref 0.7–4.0)
MCH: 26.5 pg (ref 26.0–34.0)
MCHC: 31.7 g/dL (ref 30.0–36.0)
MCV: 83.8 fL (ref 78.0–100.0)
Monocytes Absolute: 0.7 10*3/uL (ref 0.1–1.0)
Monocytes Relative: 7 % (ref 3–12)
Neutro Abs: 6.9 10*3/uL (ref 1.7–7.7)
Neutrophils Relative %: 62 % (ref 43–77)
Platelets: 495 10*3/uL — ABNORMAL HIGH (ref 150–400)
RBC: 5.2 MIL/uL — ABNORMAL HIGH (ref 3.87–5.11)
RDW: 13.6 % (ref 11.5–15.5)
WBC: 11 10*3/uL — ABNORMAL HIGH (ref 4.0–10.5)

## 2014-10-24 LAB — COMPREHENSIVE METABOLIC PANEL
ALT: 12 U/L (ref 0–35)
AST: 20 U/L (ref 0–37)
Albumin: 4.6 g/dL (ref 3.5–5.2)
Alkaline Phosphatase: 128 U/L — ABNORMAL HIGH (ref 39–117)
Anion gap: 7 (ref 5–15)
BUN: 11 mg/dL (ref 6–23)
CO2: 28 mmol/L (ref 19–32)
Calcium: 9.3 mg/dL (ref 8.4–10.5)
Chloride: 104 mmol/L (ref 96–112)
Creatinine, Ser: 0.84 mg/dL (ref 0.50–1.10)
GFR calc Af Amer: >90 mL/min (ref >90)
GFR calc non Af Amer: >90 mL/min (ref >90)
Glucose, Bld: 90 mg/dL (ref 70–99)
Potassium: 3.8 mmol/L (ref 3.5–5.1)
Sodium: 139 mmol/L (ref 135–145)
Total Bilirubin: 0.4 mg/dL (ref 0.3–1.2)
Total Protein: 8 g/dL (ref 6.0–8.3)

## 2014-10-24 LAB — POC URINE PREG, ED: Preg Test, Ur: NEGATIVE

## 2014-10-24 MED ORDER — ONDANSETRON 4 MG PO TBDP: ORAL_TABLET | ORAL | Status: DC

## 2014-10-24 NOTE — ED Notes (Signed)
Pt reports n/v/d since yesterday morning. Pt reports her son was sick as well. Pt reports diarrhea today more than 10 times. Pt vomited about 8 times today. Pt also requesting pregnancy test.

## 2014-10-24 NOTE — ED Provider Notes (Signed)
CSN: [id number]     Arrival date & time 10/24/14  1935 History   First MD Initiated Contact with Patient 10/24/14 2113     Chief Complaint  Patient presents with  . N/V/D   . Chills     (Consider location/radiation/quality/duration/timing/severity/associated sxs/prior Treatment) HPI Phyllis Dalton is a 24 y.o. female who comes in for evaluation of nausea/vomiting/diarrhea since yesterday evening. Patient states 2 days prior her son came home daycare with the same symptoms. She reports nausea and vomiting 6 today, nonbloody and nonbilious. She also reports "multiple bouts" of diarrhea, cannot recall specific number. Denies any bloody stool or overtly foul smelling stool. She reports difficulty keeping food and liquids down. She denies fevers at home, abdominal pain, chest pain, shortness of breath, urinary symptoms, vaginal bleeding or discharge, dizziness, syncope, new rashes. She requests a pregnancy test. Denies any vomiting or diarrhea since being in the ED.  Past Medical History  Diagnosis Date  . Panic attack   . Obesity   . Depression   . Trichomonal vaginitis    Past Surgical History  Procedure Laterality Date  . Mouth surgery    . Tonsillectomy    . Addenoidectomy     Family History  Problem Relation Age of Onset  . Diabetes Other   . Diabetes Mother   . HIV Mother    History  Substance Use Topics  . Smoking status: Former Smoker    Types: Cigarettes    Quit date: 12/25/2012  . Smokeless tobacco: Never Used  . Alcohol Use: No     Comment: rare   OB History    Gravida Para Term Preterm AB TAB SAB Ectopic Multiple Living   2 2 2  0 0 0 0 0 0 2     Review of Systems A 10 point review of systems was completed and was negative except for pertinent positives and negatives as mentioned in the history of present illness     Allergies  Review of patient's allergies indicates no known allergies.  Home Medications   Prior to Admission medications   Medication  Sig Start Date End Date Taking? Authorizing Provider  etonogestrel (IMPLANON) 68 MG IMPL implant Inject 1 each into the skin once.   Yes Historical Provider, MD  Exenatide ER (BYDUREON) 2 MG PEN Inject 1 each into the skin once a week. On wednesday   Yes Historical Provider, MD  metFORMIN (GLUCOPHAGE) 500 MG tablet Take 500 mg by mouth daily with breakfast.   Yes Historical Provider, MD  amoxicillin-clavulanate (AUGMENTIN) 875-125 MG per tablet Take 1 tablet by mouth every 12 (twelve) hours. Patient not taking: Reported on 10/24/2014 09/19/14   Fransico Meadow, PA-C  HYDROcodone-acetaminophen (NORCO/VICODIN) 5-325 MG per tablet Take 1-2 tablets every 6 hours as needed for severe pain Patient not taking: Reported on 10/24/2014 07/10/14   Carlisle Cater, PA-C  ibuprofen (ADVIL,MOTRIN) 600 MG tablet Take 1 tablet (600 mg total) by mouth every 6 (six) hours as needed. Patient not taking: Reported on 10/24/2014 07/10/14   Carlisle Cater, PA-C  methocarbamol (ROBAXIN) 500 MG tablet Take 2 tablets (1,000 mg total) by mouth 4 (four) times daily. Patient not taking: Reported on 10/24/2014 07/10/14   Carlisle Cater, PA-C  naproxen (NAPROSYN) 500 MG tablet Take 1 tablet (500 mg total) by mouth 2 (two) times daily. Patient not taking: Reported on 10/24/2014 04/26/14   Ernestina Patches, MD  ondansetron (ZOFRAN ODT) 4 MG disintegrating tablet 66m ODT q4 hours prn nausea/vomit 10/24/14  Viona Gilmore Cartner, PA-C   BP 98/48 mmHg  Pulse 80  Temp(Src) 98.1 F (36.7 C) (Oral)  Resp 16  Ht 5' 10"  (1.778 m)  Wt 245 lb (111.131 kg)  BMI 35.15 kg/m2  SpO2 100% Physical Exam  Constitutional: She is oriented to person, place, and time. She appears well-developed and well-nourished.  Patient is sleeping comfortably on exam bed.  HENT:  Head: Normocephalic and atraumatic.  Mouth/Throat: Oropharynx is clear and moist.  Eyes: Conjunctivae are normal. Pupils are equal, round, and reactive to light. Right eye exhibits no discharge. Left  eye exhibits no discharge. No scleral icterus.  Neck: Neck supple.  Cardiovascular: Normal rate, regular rhythm and normal heart sounds.   Pulmonary/Chest: Effort normal and breath sounds normal. No respiratory distress. She has no wheezes. She has no rales.  Abdominal: Soft. She exhibits no distension and no mass. There is no tenderness. There is no rebound and no guarding.  Musculoskeletal: She exhibits no tenderness.  Neurological: She is alert and oriented to person, place, and time.  Cranial Nerves II-XII grossly intact  Skin: Skin is warm and dry. No rash noted.  Psychiatric: She has a normal mood and affect.  Nursing note and vitals reviewed.   ED Course  Procedures (including critical care time) Labs Review Labs Reviewed  CBC WITH DIFFERENTIAL/PLATELET - Abnormal; Notable for the following:    WBC 11.0 (*)    RBC 5.20 (*)    Platelets 495 (*)    All other components within normal limits  COMPREHENSIVE METABOLIC PANEL - Abnormal; Notable for the following:    Alkaline Phosphatase 128 (*)    All other components within normal limits  POC URINE PREG, ED    Imaging Review No results found.   EKG Interpretation None     Meds given in ED:  Medications - No data to display  Discharge Medication List as of 10/24/2014 10:36 PM    START taking these medications   Details  ondansetron (ZOFRAN ODT) 4 MG disintegrating tablet 79m ODT q4 hours prn nausea/vomit, Print       Filed Vitals:   10/24/14 1951 10/24/14 2125 10/24/14 2305  BP: 109/59 115/46 98/48  Pulse: 90 85 80  Temp: 98.1 F (36.7 C) 98.1 F (36.7 C) 98.1 F (36.7 C)  TempSrc: Oral Oral Oral  Resp: 16 16 16   Height:  5' 10"  (1.778 m)   Weight:  245 lb (111.131 kg)   SpO2: 100% 100% 100%    MDM  Vitals stable - WNL -afebrile Pt resting comfortably in ED. Tolerating by mouth in ED. No vomiting or diarrhea since being in ED. PE-benign abdominal exam. No evidence of acute or surgical  abdomen. Labwork--nonspecific leukocytosis, elevated alk phos without RUQ tenderness. Otherwise noncontributory.  DDX--patient likely suffering from viral syndrome. No evidence of other acute or emergent pathology. Will DC with Zofran and instructions for rehydration strategy. I discussed all relevant lab findings and imaging results with pt and they verbalized understanding. Discussed f/u with PCP within 48 hrs and return precautions, pt very amenable to plan.  Final diagnoses:  Viral syndrome      BVerl Dicker PA-C 10/25/14 1110  MErnestina Patches MD 10/25/14 2107

## 2014-10-24 NOTE — ED Notes (Signed)
Patient requested a blanket. Blanket was given to patient.

## 2014-11-20 ENCOUNTER — Encounter (HOSPITAL_BASED_OUTPATIENT_CLINIC_OR_DEPARTMENT_OTHER): Payer: Self-pay | Admitting: Emergency Medicine

## 2014-12-13 ENCOUNTER — Inpatient Hospital Stay (HOSPITAL_COMMUNITY)
Admission: AD | Admit: 2014-12-13 | Discharge: 2014-12-13 | Disposition: A | Discharge status: Home / Self Care | Payer: Medicaid Other | Source: Ambulatory Visit | Attending: Obstetrics and Gynecology | Admitting: Obstetrics and Gynecology

## 2014-12-13 ENCOUNTER — Encounter (HOSPITAL_COMMUNITY): Payer: Self-pay | Admitting: *Deleted

## 2014-12-13 DIAGNOSIS — Z87891 Personal history of nicotine dependence: Secondary | ICD-10-CM | POA: Insufficient documentation

## 2014-12-13 DIAGNOSIS — N63 Unspecified lump in breast: Secondary | ICD-10-CM | POA: Diagnosis present

## 2014-12-13 DIAGNOSIS — N61 Inflammatory disorders of breast: Secondary | ICD-10-CM

## 2014-12-13 DIAGNOSIS — N611 Abscess of the breast and nipple: Secondary | ICD-10-CM

## 2014-12-13 DIAGNOSIS — Z3202 Encounter for pregnancy test, result negative: Secondary | ICD-10-CM | POA: Diagnosis not present

## 2014-12-13 LAB — CBC WITH DIFFERENTIAL/PLATELET
Basophils Absolute: 0.1 10*3/uL (ref 0.0–0.1)
Basophils Relative: 1 % (ref 0–1)
Eosinophils Absolute: 0.3 10*3/uL (ref 0.0–0.7)
Eosinophils Relative: 3 % (ref 0–5)
HCT: 39 % (ref 36.0–46.0)
Hemoglobin: 12.7 g/dL (ref 12.0–15.0)
Lymphocytes Relative: 19 % (ref 12–46)
Lymphs Abs: 1.8 10*3/uL (ref 0.7–4.0)
MCH: 26.4 pg (ref 26.0–34.0)
MCHC: 32.6 g/dL (ref 30.0–36.0)
MCV: 81.1 fL (ref 78.0–100.0)
Monocytes Absolute: 0.7 10*3/uL (ref 0.1–1.0)
Monocytes Relative: 7 % (ref 3–12)
Neutro Abs: 6.7 10*3/uL (ref 1.7–7.7)
Neutrophils Relative %: 70 % (ref 43–77)
Platelets: 411 10*3/uL — ABNORMAL HIGH (ref 150–400)
RBC: 4.81 MIL/uL (ref 3.87–5.11)
RDW: 13.6 % (ref 11.5–15.5)
WBC: 9.6 10*3/uL (ref 4.0–10.5)

## 2014-12-13 LAB — POCT PREGNANCY, URINE: Preg Test, Ur: NEGATIVE

## 2014-12-13 MED ORDER — SULFAMETHOXAZOLE-TRIMETHOPRIM 800-160 MG PO TABS: 1.0000 | ORAL_TABLET | Freq: Two times a day (BID) | ORAL | Status: AC

## 2014-12-13 MED ORDER — KETOROLAC TROMETHAMINE 60 MG/2ML IM SOLN
60.0000 mg | Freq: Once | INTRAMUSCULAR | Status: AC
  Administered 2014-12-13: 60 mg via INTRAMUSCULAR
  Filled 2014-12-13: qty 2

## 2014-12-13 NOTE — Discharge Instructions (Signed)

## 2014-12-13 NOTE — MAU Provider Note (Signed)
History     CSN: [id number]  Arrival date and time: 12/13/14 1143   First Provider Initiated Contact with Patient 12/13/14 1338      Chief Complaint  Patient presents with  . breast abcess    breast abcess   HPI   Ms. Phyllis Dalton is a 23 y.o. female G2P2002 who presents with a breast lump or "abscess". She first noticed the lump 2 days ago and it has gotten increasingly more tender, and has grown in size. She had something similar to this on her other breast and other parts of her body. She denies fever or fever like symptoms. She feels that the pain radiates her her armpit. She denies leaking of fluid. She has tried warm compresses with some relief. She has not tried any pain medication over the counter. She currently rates her pain 6/10.    RN:  Pt noticed a lump under her right breast two days ago and says it has just gotten bigger. She tried warm compresses without relief. She says the pain radiates up under her axilla. Pt states she has not taken any medication since yesterday, she took Tramadol yesterday unsure of the strength. She has had blood in her stools since yesterday. She says it comes and goes but she has had this happen before.          OB History    Gravida Para Term Preterm AB TAB SAB Ectopic Multiple Living   2 2 2  0 0 0 0 0 0 2      Past Medical History  Diagnosis Date  . Panic attack   . Obesity   . Depression   . Trichomonal vaginitis     Past Surgical History  Procedure Laterality Date  . Mouth surgery    . Tonsillectomy    . Addenoidectomy      Family History  Problem Relation Age of Onset  . Diabetes Other   . Diabetes Mother   . HIV Mother     History  Substance Use Topics  . Smoking status: Former Smoker    Types: Cigarettes    Quit date: 12/25/2012  . Smokeless tobacco: Never Used  . Alcohol Use: No     Comment: rare    Allergies: No Known Allergies  Prescriptions prior to admission  Medication Sig Dispense  Refill Last Dose  . Exenatide ER (BYDUREON) 2 MG PEN Inject 1 each into the skin once a week. On wednesday   Past Week at Unknown time  . meloxicam (MOBIC) 7.5 MG tablet Take 7.5 mg by mouth daily.   12/12/2014 at Unknown time  . metFORMIN (GLUCOPHAGE) 500 MG tablet Take 500 mg by mouth daily with breakfast.   12/12/2014 at Unknown time  . traMADol (ULTRAM) 50 MG tablet Take 50 mg by mouth every 6 (six) hours as needed for moderate pain.   12/12/2014 at Unknown time  . amoxicillin-clavulanate (AUGMENTIN) 875-125 MG per tablet Take 1 tablet by mouth every 12 (twelve) hours. (Patient not taking: Reported on 10/24/2014) 20 tablet 0 Completed Course at Unknown time  . etonogestrel (IMPLANON) 68 MG IMPL implant Inject 1 each into the skin once.   continuous  . HYDROcodone-acetaminophen (NORCO/VICODIN) 5-325 MG per tablet Take 1-2 tablets every 6 hours as needed for severe pain (Patient not taking: Reported on 10/24/2014) 8 tablet 0 Completed Course at Unknown time  . ibuprofen (ADVIL,MOTRIN) 600 MG tablet Take 1 tablet (600 mg total) by mouth every 6 (six) hours as  needed. (Patient not taking: Reported on 10/24/2014) 20 tablet 0 Completed Course at Unknown time  . methocarbamol (ROBAXIN) 500 MG tablet Take 2 tablets (1,000 mg total) by mouth 4 (four) times daily. (Patient not taking: Reported on 10/24/2014) 20 tablet 0 Completed Course at Unknown time  . naproxen (NAPROSYN) 500 MG tablet Take 1 tablet (500 mg total) by mouth 2 (two) times daily. (Patient not taking: Reported on 10/24/2014) 30 tablet 0 Completed Course at Unknown time  . ondansetron (ZOFRAN ODT) 4 MG disintegrating tablet 4mg  ODT q4 hours prn nausea/vomit (Patient not taking: Reported on 12/13/2014) 4 tablet 0    Results for orders placed or performed during the hospital encounter of 12/13/14 (from the past 48 hour(s))  Pregnancy, urine POC     Status: None   Collection Time: 12/13/14 12:16 PM  Result Value Ref Range   Preg Test, Ur NEGATIVE NEGATIVE      Comment:        THE SENSITIVITY OF THIS METHODOLOGY IS >24 mIU/mL   CBC with Differential     Status: Abnormal   Collection Time: 12/13/14  2:00 PM  Result Value Ref Range   WBC 9.6 4.0 - 10.5 K/uL   RBC 4.81 3.87 - 5.11 MIL/uL   Hemoglobin 12.7 12.0 - 15.0 g/dL   HCT 39.0 36.0 - 46.0 %   MCV 81.1 78.0 - 100.0 fL   MCH 26.4 26.0 - 34.0 pg   MCHC 32.6 30.0 - 36.0 g/dL   RDW 13.6 11.5 - 15.5 %   Platelets 411 (H) 150 - 400 K/uL   Neutrophils Relative % 70 43 - 77 %   Neutro Abs 6.7 1.7 - 7.7 K/uL   Lymphocytes Relative 19 12 - 46 %   Lymphs Abs 1.8 0.7 - 4.0 K/uL   Monocytes Relative 7 3 - 12 %   Monocytes Absolute 0.7 0.1 - 1.0 K/uL   Eosinophils Relative 3 0 - 5 %   Eosinophils Absolute 0.3 0.0 - 0.7 K/uL   Basophils Relative 1 0 - 1 %   Basophils Absolute 0.1 0.0 - 0.1 K/uL    Review of Systems  Constitutional: Negative for fever and chills.   Physical Exam   Blood pressure 115/68, pulse 94, temperature 98 F (36.7 C), temperature source Oral, resp. rate 18, not currently breastfeeding.  Physical Exam  Constitutional: She is oriented to person, place, and time. She appears well-developed and well-nourished. No distress.  HENT:  Head: Normocephalic.  Eyes: Pupils are equal, round, and reactive to light.  Neck: Neck supple.  Respiratory: Right breast exhibits mass, skin change and tenderness. Right breast exhibits no inverted nipple and no nipple discharge. Breasts are symmetrical.    Musculoskeletal: Normal range of motion.  Neurological: She is alert and oriented to person, place, and time.  Skin: Skin is warm. She is not diaphoretic.  Psychiatric: Her behavior is normal.    MAU Course  Procedures  None  MDM Toradol 60 mg IM  CBC with diff.   Assessment and Plan   A:  1. Breast abscess     P:  Discharge home in stable condition RX: bactrim Referral to breast center made; patient needs [place]. She may need surgical consult for I&D Go to [facility] or [facility] if you develop worsening pain or fever Continue warm compresses Ok to take ibuprofen as directed on the bottle   Lezlie Lye, NP 12/13/2014 6:51 PM

## 2014-12-13 NOTE — MAU Note (Addendum)
Pt noticed a lump under her right breast two days ago and says it has just gotten bigger.  She tried warm compresses without relief.  She says the pain radiates up under her axilla.  Pt states she has not taken any medication since yesterday, she took Tramadol yesterday unsure of the strength.  She has had blood in her stools since yesterday.  She says it comes and goes but she has had this happen before.

## 2014-12-14 ENCOUNTER — Emergency Department (HOSPITAL_COMMUNITY)
Admission: EM | Admit: 2014-12-14 | Discharge: 2014-12-14 | Disposition: A | Discharge status: Home / Self Care | Payer: Medicaid Other | Attending: Emergency Medicine | Admitting: Emergency Medicine

## 2014-12-14 ENCOUNTER — Encounter (HOSPITAL_COMMUNITY): Payer: Self-pay | Admitting: Emergency Medicine

## 2014-12-14 DIAGNOSIS — Z8619 Personal history of other infectious and parasitic diseases: Secondary | ICD-10-CM | POA: Insufficient documentation

## 2014-12-14 DIAGNOSIS — R51 Headache: Secondary | ICD-10-CM | POA: Diagnosis not present

## 2014-12-14 DIAGNOSIS — Z79899 Other long term (current) drug therapy: Secondary | ICD-10-CM | POA: Insufficient documentation

## 2014-12-14 DIAGNOSIS — Z87891 Personal history of nicotine dependence: Secondary | ICD-10-CM | POA: Diagnosis not present

## 2014-12-14 DIAGNOSIS — F329 Major depressive disorder, single episode, unspecified: Secondary | ICD-10-CM | POA: Diagnosis not present

## 2014-12-14 DIAGNOSIS — F410 Panic disorder [episodic paroxysmal anxiety] without agoraphobia: Secondary | ICD-10-CM | POA: Diagnosis not present

## 2014-12-14 DIAGNOSIS — Z791 Long term (current) use of non-steroidal anti-inflammatories (NSAID): Secondary | ICD-10-CM | POA: Insufficient documentation

## 2014-12-14 DIAGNOSIS — Z792 Long term (current) use of antibiotics: Secondary | ICD-10-CM | POA: Insufficient documentation

## 2014-12-14 DIAGNOSIS — E669 Obesity, unspecified: Secondary | ICD-10-CM | POA: Diagnosis not present

## 2014-12-14 DIAGNOSIS — N61 Inflammatory disorders of breast: Secondary | ICD-10-CM | POA: Diagnosis present

## 2014-12-14 DIAGNOSIS — N611 Abscess of the breast and nipple: Secondary | ICD-10-CM

## 2014-12-14 MED ORDER — HYDROCODONE-ACETAMINOPHEN 5-325 MG PO TABS
1.0000 | ORAL_TABLET | Freq: Once | ORAL | Status: AC
  Administered 2014-12-14: 1 via ORAL
  Filled 2014-12-14: qty 1

## 2014-12-14 MED ORDER — HYDROCODONE-ACETAMINOPHEN 5-325 MG PO TABS: 1.0000 | ORAL_TABLET | ORAL | Status: DC | PRN

## 2014-12-14 NOTE — ED Provider Notes (Signed)
CSN: [id number]     Arrival date & time 12/14/14  1832 History  This chart was scribed for Charlann Lange, PA-C working with Milton Ferguson, MD by Randa Evens, ED Scribe. This patient was seen in room WTR5/WTR5 and the patient's care was started at 8:06 PM.    Chief Complaint  Patient presents with  . Abscess   Patient is a 24 y.o. female presenting with abscess. The history is provided by the patient. No language interpreter was used.  Abscess Associated symptoms: headaches and nausea   Associated symptoms: no fever and no vomiting    HPI Comments: Phyllis Dalton is a 24 y.o. female who presents to the Emergency Department complaining of worsening right breast abscess onset 4 days prior. Pt reports associated chills, HA and nausea. Pt states that [facility] and prescribed her bactrim that she began taking yesterday that has not provided any relief. Pt states she has been taking tramadol with no relief.  Pt doesn't report fever, red streaking, vomiting or other related symptoms.    Past Medical History  Diagnosis Date  . Panic attack   . Obesity   . Depression   . Trichomonal vaginitis    Past Surgical History  Procedure Laterality Date  . Mouth surgery    . Tonsillectomy    . Addenoidectomy     Family History  Problem Relation Age of Onset  . Diabetes Other   . Diabetes Mother   . HIV Mother    History  Substance Use Topics  . Smoking status: Former Smoker    Types: Cigarettes    Quit date: 12/25/2012  . Smokeless tobacco: Never Used  . Alcohol Use: No     Comment: rare   OB History    Gravida Para Term Preterm AB TAB SAB Ectopic Multiple Living   2 2 2  0 0 0 0 0 0 2     Review of Systems  Constitutional: Positive for chills. Negative for fever.  Gastrointestinal: Positive for nausea. Negative for vomiting.  Skin: Positive for rash (abscess).  Neurological: Positive for headaches.  All other systems reviewed and are  negative.   Allergies  Review of patient's allergies indicates no known allergies.  Home Medications   Prior to Admission medications   Medication Sig Start Date End Date Taking? Authorizing Provider  amoxicillin-clavulanate (AUGMENTIN) 875-125 MG per tablet Take 1 tablet by mouth every 12 (twelve) hours. Patient not taking: Reported on 10/24/2014 09/19/14   Fransico Meadow, PA-C  etonogestrel (IMPLANON) 68 MG IMPL implant Inject 1 each into the skin once.    Historical Provider, MD  Exenatide ER (BYDUREON) 2 MG PEN Inject 1 each into the skin once a week. On wednesday    Historical Provider, MD  HYDROcodone-acetaminophen (NORCO/VICODIN) 5-325 MG per tablet Take 1-2 tablets every 6 hours as needed for severe pain Patient not taking: Reported on 10/24/2014 07/10/14   Carlisle Cater, PA-C  ibuprofen (ADVIL,MOTRIN) 600 MG tablet Take 1 tablet (600 mg total) by mouth every 6 (six) hours as needed. Patient not taking: Reported on 10/24/2014 07/10/14   Carlisle Cater, PA-C  meloxicam (MOBIC) 7.5 MG tablet Take 7.5 mg by mouth daily.    Historical Provider, MD  metFORMIN (GLUCOPHAGE) 500 MG tablet Take 500 mg by mouth daily with breakfast.    Historical Provider, MD  methocarbamol (ROBAXIN) 500 MG tablet Take 2 tablets (1,000 mg total) by mouth 4 (four) times daily. Patient not taking:  Reported on 10/24/2014 07/10/14   Carlisle Cater, PA-C  naproxen (NAPROSYN) 500 MG tablet Take 1 tablet (500 mg total) by mouth 2 (two) times daily. Patient not taking: Reported on 10/24/2014 04/26/14   Ernestina Patches, MD  ondansetron (ZOFRAN ODT) 4 MG disintegrating tablet 4mg  ODT q4 hours prn nausea/vomit Patient not taking: Reported on 12/13/2014 10/24/14   Comer Locket, PA-C  sulfamethoxazole-trimethoprim (BACTRIM DS,SEPTRA DS) 800-160 MG per tablet Take 1 tablet by mouth 2 (two) times daily. 12/13/14 12/20/14  Lezlie Lye, NP  traMADol (ULTRAM) 50 MG tablet Take 50 mg by mouth every 6 (six) hours as needed for moderate  pain.    Historical Provider, MD   BP 126/74 mmHg  Pulse 89  Temp(Src) 98.7 F (37.1 C) (Oral)  Resp 15  SpO2 100%   Physical Exam  Constitutional: She is oriented to person, place, and time. She appears well-developed and well-nourished. No distress.  HENT:  Head: Normocephalic and atraumatic.  Eyes: Conjunctivae and EOM are normal.  Neck: Neck supple. No tracheal deviation present.  Cardiovascular: Normal rate.   Pulmonary/Chest: Effort normal. No respiratory distress.  Warm indurated measuring 2 cm diameter to the inferior surface of right breast no central fluctuance no drainage and very tender consistent with breast abscess.   Musculoskeletal: Normal range of motion.  Neurological: She is alert and oriented to person, place, and time.  Skin: Skin is warm and dry.  Psychiatric: She has a normal mood and affect. Her behavior is normal.  Nursing note and vitals reviewed.   ED Course  Procedures (including critical care time) DIAGNOSTIC STUDIES: Oxygen Saturation is 100% on RA, normal by my interpretation.    COORDINATION OF CARE: 8:35 PM-Discussed treatment plan with pt at bedside and pt agreed to plan.     Labs Review Labs Reviewed - No data to display  Imaging Review No results found.   EKG Interpretation None      MDM   Final diagnoses:  None   1. Right breast abscess  She has uncontrolled pain with known abscess. She has a referral in place to the breast center for surgical consult. Pain medication provided. VSS, afebrile. Will discharge home.    I personally performed the services described in this documentation, which was scribed in my presence. The recorded information has been reviewed and is accurate.       Charlann Lange, PA-C 12/15/14 7867  Milton Ferguson, MD 12/16/14 [phone]

## 2014-12-14 NOTE — Discharge Instructions (Signed)
CONTINUE YOUR ANTIBIOTIC FOR THE ABSCESS. TAKE NORCO FOR PAIN. CALL TOMORROW AS PLANNED TO SCHEDULE AN APPOINTMENT FOR FURTHER MANAGEMENT AS DIRECTED BY THE [facility].  Heat Therapy Heat therapy can help ease sore, stiff, injured, and tight muscles and joints. Heat relaxes your muscles, which may help ease your pain.  RISKS AND COMPLICATIONS If you have any of the following conditions, do not use heat therapy unless your health care provider has approved:  Poor circulation.  Healing wounds or scarred skin in the area being treated.  Diabetes, heart disease, or high blood pressure.  Not being able to feel (numbness) the area being treated.  Unusual swelling of the area being treated.  Active infections.  Blood clots.  Cancer.  Inability to communicate pain. This may include young children and people who have problems with their brain function (dementia).  Pregnancy. Heat therapy should only be used on old, pre-existing, or long-lasting (chronic) injuries. Do not use heat therapy on new injuries unless directed by your health care provider. HOW TO USE HEAT THERAPY There are several different kinds of heat therapy, including:  Moist heat pack.  Warm water bath.  Hot water bottle.  Electric heating pad.  Heated gel pack.  Heated wrap.  Electric heating pad. Use the heat therapy method suggested by your health care provider. Follow your health care provider's instructions on when and how to use heat therapy. GENERAL HEAT THERAPY RECOMMENDATIONS  Do not sleep while using heat therapy. Only use heat therapy while you are awake.  Your skin may turn pink while using heat therapy. Do not use heat therapy if your skin turns red.  Do not use heat therapy if you have new pain.  High heat or long exposure to heat can cause burns. Be careful when using heat therapy to avoid burning your skin.  Do not use heat therapy on areas of your skin that are already irritated, such  as with a rash or sunburn. SEEK MEDICAL CARE IF:  You have blisters, redness, swelling, or numbness.  You have new pain.  Your pain is worse. MAKE SURE YOU:  Understand these instructions.  Will watch your condition.  Will get help right away if you are not doing well or get worse. Document Released: 10/31/2011 Document Revised: 12/23/2013 Document Reviewed: 10/01/2013 [facility] Patient Information 2015 [place], [place]. This information is not intended to replace advice given to you by your health care provider. Make sure you discuss any questions you have with your health care provider.

## 2014-12-14 NOTE — ED Notes (Signed)
Pt reports she has abscess under right breast that she would like drained.

## 2014-12-15 ENCOUNTER — Ambulatory Visit
Admission: RE | Admit: 2014-12-15 | Discharge: 2014-12-15 | Disposition: A | Discharge status: Home / Self Care | Payer: Medicaid Other | Source: Ambulatory Visit | Attending: Obstetrics and Gynecology | Admitting: Obstetrics and Gynecology

## 2014-12-15 ENCOUNTER — Other Ambulatory Visit (HOSPITAL_COMMUNITY): Payer: Self-pay | Admitting: Obstetrics and Gynecology

## 2014-12-15 DIAGNOSIS — N611 Abscess of the breast and nipple: Secondary | ICD-10-CM

## 2015-01-05 ENCOUNTER — Ambulatory Visit
Admission: RE | Admit: 2015-01-05 | Discharge: 2015-01-05 | Disposition: A | Discharge status: Home / Self Care | Payer: Medicaid Other | Source: Ambulatory Visit | Attending: Obstetrics and Gynecology | Admitting: Obstetrics and Gynecology

## 2015-01-05 DIAGNOSIS — N611 Abscess of the breast and nipple: Secondary | ICD-10-CM

## 2015-02-07 ENCOUNTER — Emergency Department (HOSPITAL_COMMUNITY): Payer: Medicaid Other

## 2015-02-07 ENCOUNTER — Emergency Department (HOSPITAL_COMMUNITY)
Admission: EM | Admit: 2015-02-07 | Discharge: 2015-02-07 | Disposition: A | Discharge status: Home / Self Care | Payer: Medicaid Other | Attending: Emergency Medicine | Admitting: Emergency Medicine

## 2015-02-07 ENCOUNTER — Encounter (HOSPITAL_COMMUNITY): Payer: Self-pay | Admitting: Emergency Medicine

## 2015-02-07 DIAGNOSIS — E119 Type 2 diabetes mellitus without complications: Secondary | ICD-10-CM | POA: Insufficient documentation

## 2015-02-07 DIAGNOSIS — Z87891 Personal history of nicotine dependence: Secondary | ICD-10-CM | POA: Diagnosis not present

## 2015-02-07 DIAGNOSIS — E669 Obesity, unspecified: Secondary | ICD-10-CM | POA: Insufficient documentation

## 2015-02-07 DIAGNOSIS — Z8659 Personal history of other mental and behavioral disorders: Secondary | ICD-10-CM | POA: Diagnosis not present

## 2015-02-07 DIAGNOSIS — Z8619 Personal history of other infectious and parasitic diseases: Secondary | ICD-10-CM | POA: Insufficient documentation

## 2015-02-07 DIAGNOSIS — Z3202 Encounter for pregnancy test, result negative: Secondary | ICD-10-CM | POA: Insufficient documentation

## 2015-02-07 DIAGNOSIS — R1030 Lower abdominal pain, unspecified: Secondary | ICD-10-CM | POA: Insufficient documentation

## 2015-02-07 DIAGNOSIS — R109 Unspecified abdominal pain: Secondary | ICD-10-CM

## 2015-02-07 HISTORY — DX: Type 2 diabetes mellitus without complications: E11.9

## 2015-02-07 LAB — COMPREHENSIVE METABOLIC PANEL
ALT: 20 U/L (ref 14–54)
AST: 23 U/L (ref 15–41)
Albumin: 4.4 g/dL (ref 3.5–5.0)
Alkaline Phosphatase: 111 U/L (ref 38–126)
Anion gap: 6 (ref 5–15)
BUN: 11 mg/dL (ref 6–20)
CO2: 26 mmol/L (ref 22–32)
Calcium: 9.1 mg/dL (ref 8.9–10.3)
Chloride: 105 mmol/L (ref 101–111)
Creatinine, Ser: 0.85 mg/dL (ref 0.44–1.00)
GFR calc Af Amer: >60 mL/min (ref >60)
GFR calc non Af Amer: >60 mL/min (ref >60)
Glucose, Bld: 80 mg/dL (ref 65–99)
Potassium: 3.8 mmol/L (ref 3.5–5.1)
Sodium: 137 mmol/L (ref 135–145)
Total Bilirubin: 0.5 mg/dL (ref 0.3–1.2)
Total Protein: 8.4 g/dL — ABNORMAL HIGH (ref 6.5–8.1)

## 2015-02-07 LAB — CBC WITH DIFFERENTIAL/PLATELET
Basophils Absolute: 0.1 10*3/uL (ref 0.0–0.1)
Basophils Relative: 1 % (ref 0–1)
Eosinophils Absolute: 0.2 10*3/uL (ref 0.0–0.7)
Eosinophils Relative: 2 % (ref 0–5)
HCT: 41.7 % (ref 36.0–46.0)
Hemoglobin: 13.2 g/dL (ref 12.0–15.0)
Lymphocytes Relative: 23 % (ref 12–46)
Lymphs Abs: 2.5 10*3/uL (ref 0.7–4.0)
MCH: 26.1 pg (ref 26.0–34.0)
MCHC: 31.7 g/dL (ref 30.0–36.0)
MCV: 82.6 fL (ref 78.0–100.0)
Monocytes Absolute: 0.8 10*3/uL (ref 0.1–1.0)
Monocytes Relative: 7 % (ref 3–12)
Neutro Abs: 7.4 10*3/uL (ref 1.7–7.7)
Neutrophils Relative %: 67 % (ref 43–77)
Platelets: 467 10*3/uL — ABNORMAL HIGH (ref 150–400)
RBC: 5.05 MIL/uL (ref 3.87–5.11)
RDW: 13.8 % (ref 11.5–15.5)
WBC: 11 10*3/uL — ABNORMAL HIGH (ref 4.0–10.5)

## 2015-02-07 LAB — URINE MICROSCOPIC-ADD ON

## 2015-02-07 LAB — URINALYSIS, ROUTINE W REFLEX MICROSCOPIC
Bilirubin Urine: NEGATIVE
Glucose, UA: NEGATIVE mg/dL
Hgb urine dipstick: NEGATIVE
Ketones, ur: NEGATIVE mg/dL
Nitrite: NEGATIVE
Protein, ur: NEGATIVE mg/dL
Specific Gravity, Urine: 1.028 (ref 1.005–1.030)
Urobilinogen, UA: 0.2 mg/dL (ref 0.0–1.0)
pH: 5.5 (ref 5.0–8.0)

## 2015-02-07 LAB — I-STAT BETA HCG BLOOD, ED (MC, WL, AP ONLY): I-stat hCG, quantitative: <5 m[IU]/mL (ref <5)

## 2015-02-07 LAB — POC URINE PREG, ED: Preg Test, Ur: NEGATIVE

## 2015-02-07 LAB — LIPASE, BLOOD: Lipase: 22 U/L (ref 22–51)

## 2015-02-07 MED ORDER — SODIUM CHLORIDE 0.9 % IV BOLUS (SEPSIS)
1000.0000 mL | Freq: Once | INTRAVENOUS | Status: AC
  Administered 2015-02-07: 1000 mL via INTRAVENOUS

## 2015-02-07 MED ORDER — SODIUM CHLORIDE 0.9 % IV SOLN: INTRAVENOUS | Status: DC

## 2015-02-07 MED ORDER — IOHEXOL 300 MG/ML  SOLN
100.0000 mL | Freq: Once | INTRAMUSCULAR | Status: AC | PRN
  Administered 2015-02-07: 100 mL via INTRAVENOUS

## 2015-02-07 NOTE — ED Notes (Signed)
MD at bedside. Dr. Allen at bedside.  

## 2015-02-07 NOTE — ED Notes (Signed)
Patient transported to CT 

## 2015-02-07 NOTE — ED Provider Notes (Signed)
CSN: [id number]     Arrival date & time 02/07/15  1719 History   First MD Initiated Contact with Patient 02/07/15 1738     Chief Complaint  Patient presents with  . Flank Pain  . Abdominal Pain  . Rectal Bleeding     (Consider location/radiation/quality/duration/timing/severity/associated sxs/prior Treatment) HPI Comments: Patient here complaining of several months of intermittent rectal bleeding which became worse today. Patient characterizes as a bright red blood per rectum. It has been associated with constipation. She also notes bilateral lower abdominal pain without vaginal bleeding or discharge. No fever or chills. Came in today because when she moved her bowels she noted some blood in the toilet. Does note slight weakness but denies any syncope or near syncope. Nothing makes her symptoms worse there are better when she rests. No treatment used for this prior to arrival. Denies any history of inflammatory bowel disease.  Patient is a 24 y.o. female presenting with flank pain, abdominal pain, and hematochezia. The history is provided by the patient.  Flank Pain Associated symptoms include abdominal pain.  Abdominal Pain Associated symptoms: hematochezia   Rectal Bleeding Associated symptoms: abdominal pain     Past Medical History  Diagnosis Date  . Panic attack   . Obesity   . Depression   . Trichomonal vaginitis   . Diabetes mellitus without complication    Past Surgical History  Procedure Laterality Date  . Mouth surgery    . Tonsillectomy    . Addenoidectomy     Family History  Problem Relation Age of Onset  . Diabetes Other   . Diabetes Mother   . HIV Mother    History  Substance Use Topics  . Smoking status: Former Smoker    Types: Cigarettes    Quit date: 12/25/2012  . Smokeless tobacco: Never Used  . Alcohol Use: No     Comment: rare   OB History    Gravida Para Term Preterm AB TAB SAB Ectopic Multiple Living   2 2 2  0 0 0 0 0 0 2     Review of  Systems  Gastrointestinal: Positive for abdominal pain and hematochezia.  Genitourinary: Positive for flank pain.  All other systems reviewed and are negative.     Allergies  Review of patient's allergies indicates no known allergies.  Home Medications   Prior to Admission medications   Medication Sig Start Date End Date Taking? Authorizing Provider  etonogestrel (IMPLANON) 68 MG IMPL implant Inject 1 each into the skin once.   Yes Historical Provider, MD  Exenatide ER (BYDUREON) 2 MG PEN Inject 1 each into the skin every Wednesday.    Yes Historical Provider, MD  meloxicam (MOBIC) 7.5 MG tablet Take 7.5 mg by mouth at bedtime.    Yes Historical Provider, MD  metFORMIN (GLUCOPHAGE) 500 MG tablet Take 500 mg by mouth at bedtime.    Yes Historical Provider, MD  traMADol (ULTRAM) 50 MG tablet Take 50 mg by mouth every 6 (six) hours as needed for moderate pain or severe pain (pain).    Yes Historical Provider, MD  amoxicillin-clavulanate (AUGMENTIN) 875-125 MG per tablet Take 1 tablet by mouth every 12 (twelve) hours. Patient not taking: Reported on 10/24/2014 09/19/14   Fransico Meadow, PA-C  HYDROcodone-acetaminophen (NORCO/VICODIN) 5-325 MG per tablet Take 1-2 tablets by mouth every 4 (four) hours as needed. Patient not taking: Reported on 02/07/2015 12/14/14   Charlann Lange, PA-C  ibuprofen (ADVIL,MOTRIN) 600 MG tablet Take 1 tablet (600 mg  total) by mouth every 6 (six) hours as needed. Patient not taking: Reported on 10/24/2014 07/10/14   Carlisle Cater, PA-C  methocarbamol (ROBAXIN) 500 MG tablet Take 2 tablets (1,000 mg total) by mouth 4 (four) times daily. Patient not taking: Reported on 10/24/2014 07/10/14   Carlisle Cater, PA-C  naproxen (NAPROSYN) 500 MG tablet Take 1 tablet (500 mg total) by mouth 2 (two) times daily. Patient not taking: Reported on 10/24/2014 04/26/14   Ernestina Patches, MD  ondansetron (ZOFRAN ODT) 4 MG disintegrating tablet 4mg  ODT q4 hours prn nausea/vomit Patient not  taking: Reported on 12/13/2014 10/24/14   Comer Locket, PA-C   BP 118/68 mmHg  Pulse 94  Temp(Src) 98.9 F (37.2 C) (Oral)  Resp 16  SpO2 100% Physical Exam  Constitutional: She is oriented to person, place, and time. She appears well-developed and well-nourished.  Non-toxic appearance. No distress.  HENT:  Head: Normocephalic and atraumatic.  Eyes: Conjunctivae, EOM and lids are normal. Pupils are equal, round, and reactive to light.  Neck: Normal range of motion. Neck supple. No tracheal deviation present. No thyroid mass present.  Cardiovascular: Normal rate, regular rhythm and normal heart sounds.  Exam reveals no gallop.   No murmur heard. Pulmonary/Chest: Effort normal and breath sounds normal. No stridor. No respiratory distress. She has no decreased breath sounds. She has no wheezes. She has no rhonchi. She has no rales.  Abdominal: Soft. Normal appearance and bowel sounds are normal. She exhibits no distension. There is no tenderness. There is no rebound and no CVA tenderness.    Genitourinary:  No gross blood noted on rectal exam  Musculoskeletal: Normal range of motion. She exhibits no edema or tenderness.  Neurological: She is alert and oriented to person, place, and time. She has normal strength. No cranial nerve deficit or sensory deficit. GCS eye subscore is 4. GCS verbal subscore is 5. GCS motor subscore is 6.  Skin: Skin is warm and dry. No abrasion and no rash noted.  Psychiatric: She has a normal mood and affect. Her speech is normal and behavior is normal.  Nursing note and vitals reviewed.   ED Course  Procedures (including critical care time) Labs Review Labs Reviewed  COMPREHENSIVE METABOLIC PANEL  CBC WITH DIFFERENTIAL/PLATELET  LIPASE, BLOOD  URINALYSIS, ROUTINE W REFLEX MICROSCOPIC (NOT AT [facility])  POC URINE PREG, ED    Imaging Review No results found.   EKG Interpretation None      MDM   Final diagnoses:  None    Patient with negative  workup. No evidence of acute hemorrhage. We'll follow-up with her Dr.    Lacretia Leigh, MD 02/07/15 2012

## 2015-02-07 NOTE — ED Notes (Signed)
Pt c/o several months of bilateral flank pain radiating to low abd.  Also having several months of intermittent rectal bleeding.

## 2015-02-07 NOTE — Discharge Instructions (Signed)
Abdominal Pain °Many things can cause abdominal pain. Usually, abdominal pain is not caused by a disease and will improve without treatment. It can often be observed and treated at home. Your health care provider will do a physical exam and possibly order blood tests and X-rays to help determine the seriousness of your pain. However, in many cases, more time must pass before a clear cause of the pain can be found. Before that point, your health care provider may not know if you need more testing or further treatment. °HOME CARE INSTRUCTIONS  °Monitor your abdominal pain for any changes. The following actions may help to alleviate any discomfort you are experiencing: °· Only take over-the-counter or prescription medicines as directed by your health care provider. °· Do not take laxatives unless directed to do so by your health care provider. °· Try a clear liquid diet (broth, tea, or water) as directed by your health care provider. Slowly move to a bland diet as tolerated. °SEEK MEDICAL CARE IF: °· You have unexplained abdominal pain. °· You have abdominal pain associated with nausea or diarrhea. °· You have pain when you urinate or have a bowel movement. °· You experience abdominal pain that wakes you in the night. °· You have abdominal pain that is worsened or improved by eating food. °· You have abdominal pain that is worsened with eating fatty foods. °· You have a fever. °SEEK IMMEDIATE MEDICAL CARE IF:  °· Your pain does not go away within 2 hours. °· You keep throwing up (vomiting). °· Your pain is felt only in portions of the abdomen, such as the right side or the left lower portion of the abdomen. °· You pass bloody or black tarry stools. °MAKE SURE YOU: °· Understand these instructions.   °· Will watch your condition.   °· Will get help right away if you are not doing well or get worse.   °Document Released: 05/18/2005 Document Revised: 08/13/2013 Document Reviewed: 04/17/2013 °ExitCare® Patient Information  ©2015 [employer]. This information is not intended to replace advice given to you by your health care provider. Make sure you discuss any questions you have with your health care provider. ° °Abdominal Pain, Women °Abdominal (stomach, pelvic, or belly) pain can be caused by many things. It is important to tell your doctor: °· The location of the pain. °· Does it come and go or is it present all the time? °· Are there things that start the pain (eating certain foods, exercise)? °· Are there other symptoms associated with the pain (fever, nausea, vomiting, diarrhea)? °All of this is helpful to know when trying to find the cause of the pain. °CAUSES  °· Stomach: virus or bacteria infection, or ulcer. °· Intestine: appendicitis (inflamed appendix), regional ileitis (Crohn's disease), ulcerative colitis (inflamed colon), irritable bowel syndrome, diverticulitis (inflamed diverticulum of the colon), or cancer of the stomach or intestine. °· Gallbladder disease or stones in the gallbladder. °· Kidney disease, kidney stones, or infection. °· Pancreas infection or cancer. °· Fibromyalgia (pain disorder). °· Diseases of the female organs: °¨ Uterus: fibroid (non-cancerous) tumors or infection. °¨ Fallopian tubes: infection or tubal pregnancy. °¨ Ovary: cysts or tumors. °¨ Pelvic adhesions (scar tissue). °¨ Endometriosis (uterus lining tissue growing in the pelvis and on the pelvic organs). °¨ Pelvic congestion syndrome (female organs filling up with blood just before the menstrual period). °¨ Pain with the menstrual period. °¨ Pain with ovulation (producing an egg). °¨ Pain with an IUD (intrauterine device, birth control) in the uterus. °¨   Cancer of the female organs. °· Functional pain (pain not caused by a disease, may improve without treatment). °· Psychological pain. °· Depression. °DIAGNOSIS  °Your doctor will decide the seriousness of your pain by doing an examination. °· Blood tests. °· X-rays. °· Ultrasound. °· CT  scan (computed tomography, special type of X-ray). °· MRI (magnetic resonance imaging). °· Cultures, for infection. °· Barium enema (dye inserted in the large intestine, to better view it with X-rays). °· Colonoscopy (looking in intestine with a lighted tube). °· Laparoscopy (minor surgery, looking in abdomen with a lighted tube). °· Major abdominal exploratory surgery (looking in abdomen with a large incision). °TREATMENT  °The treatment will depend on the cause of the pain.  °· Many cases can be observed and treated at home. °· Over-the-counter medicines recommended by your caregiver. °· Prescription medicine. °· Antibiotics, for infection. °· Birth control pills, for painful periods or for ovulation pain. °· Hormone treatment, for endometriosis. °· Nerve blocking injections. °· Physical therapy. °· Antidepressants. °· Counseling with a psychologist or psychiatrist. °· Minor or major surgery. °HOME CARE INSTRUCTIONS  °· Do not take laxatives, unless directed by your caregiver. °· Take over-the-counter pain medicine only if ordered by your caregiver. Do not take aspirin because it can cause an upset stomach or bleeding. °· Try a clear liquid diet (broth or water) as ordered by your caregiver. Slowly move to a bland diet, as tolerated, if the pain is related to the stomach or intestine. °· Have a thermometer and take your temperature several times a day, and record it. °· Bed rest and sleep, if it helps the pain. °· Avoid sexual intercourse, if it causes pain. °· Avoid stressful situations. °· Keep your follow-up appointments and tests, as your caregiver orders. °· If the pain does not go away with medicine or surgery, you may try: °¨ Acupuncture. °¨ Relaxation exercises (yoga, meditation). °¨ Group therapy. °¨ Counseling. °SEEK MEDICAL CARE IF:  °· You notice certain foods cause stomach pain. °· Your home care treatment is not helping your pain. °· You need stronger pain medicine. °· You want your IUD  removed. °· You feel faint or lightheaded. °· You develop nausea and vomiting. °· You develop a rash. °· You are having side effects or an allergy to your medicine. °SEEK IMMEDIATE MEDICAL CARE IF:  °· Your pain does not go away or gets worse. °· You have a fever. °· Your pain is felt only in portions of the abdomen. The right side could possibly be appendicitis. The left lower portion of the abdomen could be colitis or diverticulitis. °· You are passing blood in your stools (bright red or black tarry stools, with or without vomiting). °· You have blood in your urine. °· You develop chills, with or without a fever. °· You pass out. °MAKE SURE YOU:  °· Understand these instructions. °· Will watch your condition. °· Will get help right away if you are not doing well or get worse. °Document Released: 06/05/2007 Document Revised: 12/23/2013 Document Reviewed: 06/25/2009 °ExitCare® Patient Information ©2015 [employer]. This information is not intended to replace advice given to you by your health care provider. Make sure you discuss any questions you have with your health care provider. ° °

## 2015-06-12 ENCOUNTER — Inpatient Hospital Stay (HOSPITAL_COMMUNITY)
Admission: AD | Admit: 2015-06-12 | Discharge: 2015-06-12 | Disposition: A | Discharge status: Home / Self Care | Payer: Medicaid Other | Source: Ambulatory Visit | Attending: Obstetrics and Gynecology | Admitting: Obstetrics and Gynecology

## 2015-06-12 ENCOUNTER — Encounter (HOSPITAL_COMMUNITY): Payer: Self-pay

## 2015-06-12 DIAGNOSIS — R1030 Lower abdominal pain, unspecified: Secondary | ICD-10-CM

## 2015-06-12 DIAGNOSIS — R109 Unspecified abdominal pain: Secondary | ICD-10-CM | POA: Insufficient documentation

## 2015-06-12 DIAGNOSIS — N898 Other specified noninflammatory disorders of vagina: Secondary | ICD-10-CM | POA: Diagnosis present

## 2015-06-12 DIAGNOSIS — E119 Type 2 diabetes mellitus without complications: Secondary | ICD-10-CM | POA: Diagnosis not present

## 2015-06-12 DIAGNOSIS — N760 Acute vaginitis: Secondary | ICD-10-CM | POA: Insufficient documentation

## 2015-06-12 DIAGNOSIS — B9689 Other specified bacterial agents as the cause of diseases classified elsewhere: Secondary | ICD-10-CM | POA: Insufficient documentation

## 2015-06-12 DIAGNOSIS — Z87891 Personal history of nicotine dependence: Secondary | ICD-10-CM | POA: Diagnosis not present

## 2015-06-12 LAB — CBC
HCT: 39.2 % (ref 36.0–46.0)
Hemoglobin: 12.6 g/dL (ref 12.0–15.0)
MCH: 26.1 pg (ref 26.0–34.0)
MCHC: 32.1 g/dL (ref 30.0–36.0)
MCV: 81.3 fL (ref 78.0–100.0)
Platelets: 482 10*3/uL — ABNORMAL HIGH (ref 150–400)
RBC: 4.82 MIL/uL (ref 3.87–5.11)
RDW: 14 % (ref 11.5–15.5)
WBC: 9.7 10*3/uL (ref 4.0–10.5)

## 2015-06-12 LAB — URINALYSIS, ROUTINE W REFLEX MICROSCOPIC
Bilirubin Urine: NEGATIVE
Glucose, UA: NEGATIVE mg/dL
Hgb urine dipstick: NEGATIVE
Ketones, ur: 15 mg/dL — AB
Nitrite: NEGATIVE
Protein, ur: NEGATIVE mg/dL
Specific Gravity, Urine: >1.03 — ABNORMAL HIGH (ref 1.005–1.030)
Urobilinogen, UA: 0.2 mg/dL (ref 0.0–1.0)
pH: 5.5 (ref 5.0–8.0)

## 2015-06-12 LAB — WET PREP, GENITAL
Trich, Wet Prep: NONE SEEN
Yeast Wet Prep HPF POC: NONE SEEN

## 2015-06-12 LAB — URINE MICROSCOPIC-ADD ON

## 2015-06-12 LAB — POCT PREGNANCY, URINE: Preg Test, Ur: NEGATIVE

## 2015-06-12 MED ORDER — VALACYCLOVIR HCL 1 G PO TABS: 1000.0000 mg | ORAL_TABLET | Freq: Two times a day (BID) | ORAL | Status: AC

## 2015-06-12 MED ORDER — METRONIDAZOLE 500 MG PO TABS: 500.0000 mg | ORAL_TABLET | Freq: Two times a day (BID) | ORAL | Status: DC

## 2015-06-12 MED ORDER — MECLIZINE HCL 25 MG PO TABS
25.0000 mg | ORAL_TABLET | Freq: Once | ORAL | Status: DC
  Filled 2015-06-12: qty 1

## 2015-06-12 MED ORDER — LIDOCAINE HCL 2 % EX GEL
1.0000 "application " | Freq: Once | CUTANEOUS | Status: AC
  Administered 2015-06-12: 1 via TOPICAL
  Filled 2015-06-12: qty 5

## 2015-06-12 MED ORDER — VALACYCLOVIR HCL 500 MG PO TABS
1000.0000 mg | ORAL_TABLET | Freq: Once | ORAL | Status: AC
  Administered 2015-06-12: 1000 mg via ORAL
  Filled 2015-06-12: qty 2

## 2015-06-12 NOTE — MAU Note (Signed)
Pt reports having a itchy bump that has puss coming out on the outside of her vagina. Also c/o vag discharge.

## 2015-06-12 NOTE — Discharge Instructions (Signed)
Bacterial Vaginosis Bacterial vaginosis is a vaginal infection that occurs when the normal balance of bacteria in the vagina is disrupted. It results from an overgrowth of certain bacteria. This is the most common vaginal infection in women of childbearing age. Treatment is important to prevent complications, especially in pregnant women, as it can cause a premature delivery. CAUSES  Bacterial vaginosis is caused by an increase in harmful bacteria that are normally present in smaller amounts in the vagina. Several different kinds of bacteria can cause bacterial vaginosis. However, the reason that the condition develops is not fully understood. RISK FACTORS Certain activities or behaviors can put you at an increased risk of developing bacterial vaginosis, including:  Having a new sex partner or multiple sex partners.  Douching.  Using an intrauterine device (IUD) for contraception. Women do not get bacterial vaginosis from toilet seats, bedding, swimming pools, or contact with objects around them. SIGNS AND SYMPTOMS  Some women with bacterial vaginosis have no signs or symptoms. Common symptoms include:  Grey vaginal discharge.  A fishlike odor with discharge, especially after sexual intercourse.  Itching or burning of the vagina and vulva.  Burning or pain with urination. DIAGNOSIS  Your health care provider will take a medical history and examine the vagina for signs of bacterial vaginosis. A sample of vaginal fluid may be taken. Your health care provider will look at this sample under a microscope to check for bacteria and abnormal cells. A vaginal pH test may also be done.  TREATMENT  Bacterial vaginosis may be treated with antibiotic medicines. These may be given in the form of a pill or a vaginal cream. A second round of antibiotics may be prescribed if the condition comes back after treatment. Because bacterial vaginosis increases your risk for sexually transmitted diseases, getting  treated can help reduce your risk for chlamydia, gonorrhea, HIV, and herpes. HOME CARE INSTRUCTIONS   Only take over-the-counter or prescription medicines as directed by your health care provider.  If antibiotic medicine was prescribed, take it as directed. Make sure you finish it even if you start to feel better.  Tell all sexual partners that you have a vaginal infection. They should see their health care provider and be treated if they have problems, such as a mild rash or itching.  During treatment, it is important that you follow these instructions:  Avoid sexual activity or use condoms correctly.  Do not douche.  Avoid alcohol as directed by your health care provider.  Avoid breastfeeding as directed by your health care provider. SEEK MEDICAL CARE IF:   Your symptoms are not improving after 3 days of treatment.  You have increased discharge or pain.  You have a fever. MAKE SURE YOU:   Understand these instructions.  Will watch your condition.  Will get help right away if you are not doing well or get worse. FOR MORE INFORMATION  Centers for Disease Control and Prevention, Division of STD Prevention: [employer] American Sexual Health Association (ASHA): www.ashastd.org    This information is not intended to replace advice given to you by your health care provider. Make sure you discuss any questions you have with your health care provider.   Document Released: 08/08/2005 Document Revised: 08/29/2014 Document Reviewed: 03/20/2013 Elsevier Interactive Patient Education 2016 [employer]  Abdominal Pain, Adult Many things can cause belly (abdominal) pain. Most times, the belly pain is not dangerous. Many cases of belly pain can be watched and treated at home. HOME CARE   Do not  take medicines that help you go poop (laxatives) unless told to by your doctor.  Only take medicine as told by your doctor.  Eat or drink as told by your doctor. Your doctor will tell  you if you should be on a special diet. GET HELP IF:  You do not know what is causing your belly pain.  You have belly pain while you are sick to your stomach (nauseous) or have runny poop (diarrhea).  You have pain while you pee or poop.  Your belly pain wakes you up at night.  You have belly pain that gets worse or better when you eat.  You have belly pain that gets worse when you eat fatty foods.  You have a fever. GET HELP RIGHT AWAY IF:   The pain does not go away within 2 hours.  You keep throwing up (vomiting).  The pain changes and is only in the right or left part of the belly.  You have bloody or tarry looking poop. MAKE SURE YOU:   Understand these instructions.  Will watch your condition.  Will get help right away if you are not doing well or get worse.   This information is not intended to replace advice given to you by your health care provider. Make sure you discuss any questions you have with your health care provider.   Document Released: 01/25/2008 Document Revised: 08/29/2014 Document Reviewed: 04/17/2013 Elsevier Interactive Patient Education 2016 Elsevier Inc.  Abdominal Pain, Adult Many things can cause abdominal pain. Usually, abdominal pain is not caused by a disease and will improve without treatment. It can often be observed and treated at home. Your health care provider will do a physical exam and possibly order blood tests and X-rays to help determine the seriousness of your pain. However, in many cases, more time must pass before a clear cause of the pain can be found. Before that point, your health care provider may not know if you need more testing or further treatment. HOME CARE INSTRUCTIONS Monitor your abdominal pain for any changes. The following actions may help to alleviate any discomfort you are experiencing:  Only take over-the-counter or prescription medicines as directed by your health care provider.  Do not take laxatives unless  directed to do so by your health care provider.  Try a clear liquid diet (broth, tea, or water) as directed by your health care provider. Slowly move to a bland diet as tolerated. SEEK MEDICAL CARE IF:  You have unexplained abdominal pain.  You have abdominal pain associated with nausea or diarrhea.  You have pain when you urinate or have a bowel movement.  You experience abdominal pain that wakes you in the night.  You have abdominal pain that is worsened or improved by eating food.  You have abdominal pain that is worsened with eating fatty foods.  You have a fever. SEEK IMMEDIATE MEDICAL CARE IF:  Your pain does not go away within 2 hours.  You keep throwing up (vomiting).  Your pain is felt only in portions of the abdomen, such as the right side or the left lower portion of the abdomen.  You pass bloody or black tarry stools. MAKE SURE YOU:  Understand these instructions.  Will watch your condition.  Will get help right away if you are not doing well or get worse.   This information is not intended to replace advice given to you by your health care provider. Make sure you discuss any questions you have with  your health care provider.   Document Released: 05/18/2005 Document Revised: 04/29/2015 Document Reviewed: 04/17/2013 Elsevier Interactive Patient Education [employer].

## 2015-06-12 NOTE — MAU Provider Note (Signed)
History     CSN: [id number]  Arrival date and time: 06/12/15 1051   First Provider Initiated Contact with Patient 06/12/15 1259      Chief Complaint  Patient presents with  . Groin Swelling   HPI Pt is G2P2 no pregnant with Implanon contraception.  Pt c/o right labia bump that started 2 weeks ago , noticed as a small bump that has gotten larger and now has pus draining.  Pt has used hot soaks with some relief of pain.  Pt denies any previous hx of this type of bump and location.  Pt has hx of sebaceous cyst on inner thigh that had to be I&d. Pt also c/o oLLQ pain that started 1 week ago- that comes and goes- cramping in nature.  The duration of time Is irregular, no pattern.  Pt has regular daily soft bowel movements.  Pain does not radiate and does not seem to be related to eating or bowel movements.  Pt's pain is worse with movement and standing and walking.  Pt denies nausea, vomiting or chills/fever. Pt admits to flatulance Pt has had one female partner with vaginal intercourse with last encounter in July- sexual hx of partner unknown. Diabetes- on Metformin- sees Dr. Chalmers Cater; pt has been prescribed Bydureon but pt has not been using because she breaks out surrounding injection but has not contacted doctor.  Grandmother has glucometer and states her blood sugars are doing well but pt does not know number. RN note:   Past Medical History  Diagnosis Date  . Panic attack   . Obesity   . Depression   . Trichomonal vaginitis   . Diabetes mellitus without complication [facility])     Past Surgical History  Procedure Laterality Date  . Mouth surgery    . Tonsillectomy    . Addenoidectomy      Family History  Problem Relation Age of Onset  . Diabetes Other   . Diabetes Mother   . HIV Mother     Social History  Substance Use Topics  . Smoking status: Former Smoker    Types: Cigarettes    Quit date: 12/25/2012  . Smokeless tobacco: Never Used  . Alcohol Use: No     Comment:  rare    Allergies: No Known Allergies  Prescriptions prior to admission  Medication Sig Dispense Refill Last Dose  . amoxicillin-clavulanate (AUGMENTIN) 875-125 MG per tablet Take 1 tablet by mouth every 12 (twelve) hours. (Patient not taking: Reported on 10/24/2014) 20 tablet 0 Completed Course at Unknown time  . etonogestrel (IMPLANON) 68 MG IMPL implant Inject 1 each into the skin once.   02/07/2015 at Unknown time  . Exenatide ER (BYDUREON) 2 MG PEN Inject 1 each into the skin every Wednesday.    Past Week at Unknown time  . HYDROcodone-acetaminophen (NORCO/VICODIN) 5-325 MG per tablet Take 1-2 tablets by mouth every 4 (four) hours as needed. (Patient not taking: Reported on 02/07/2015) 12 tablet 0   . ibuprofen (ADVIL,MOTRIN) 600 MG tablet Take 1 tablet (600 mg total) by mouth every 6 (six) hours as needed. (Patient not taking: Reported on 10/24/2014) 20 tablet 0 Completed Course at Unknown time  . meloxicam (MOBIC) 7.5 MG tablet Take 7.5 mg by mouth at bedtime.    02/06/2015 at Unknown time  . metFORMIN (GLUCOPHAGE) 500 MG tablet Take 500 mg by mouth at bedtime.    02/06/2015 at Unknown time  . methocarbamol (ROBAXIN) 500 MG tablet Take 2 tablets (1,000 mg total) by  mouth 4 (four) times daily. (Patient not taking: Reported on 10/24/2014) 20 tablet 0 Completed Course at Unknown time  . naproxen (NAPROSYN) 500 MG tablet Take 1 tablet (500 mg total) by mouth 2 (two) times daily. (Patient not taking: Reported on 10/24/2014) 30 tablet 0 Completed Course at Unknown time  . ondansetron (ZOFRAN ODT) 4 MG disintegrating tablet 4mg  ODT q4 hours prn nausea/vomit (Patient not taking: Reported on 12/13/2014) 4 tablet 0 Completed Course at Unknown time  . traMADol (ULTRAM) 50 MG tablet Take 50 mg by mouth every 6 (six) hours as needed for moderate pain or severe pain (pain).    2 weeks    Review of Systems  Constitutional: Negative for fever and chills.  Gastrointestinal: Positive for abdominal pain. Negative for  nausea, vomiting, diarrhea and constipation.       Flatulance  Genitourinary: Negative for dysuria and urgency.   Physical Exam   Blood pressure 122/69, pulse 86, temperature 98.5 F (36.9 C), resp. rate 18, height 5\' 5"  (1.651 m), weight 254 lb 12.8 oz (115.577 kg), not currently breastfeeding.  Physical Exam  Nursing note and vitals reviewed. Constitutional: She is oriented to person, place, and time. She appears well-developed and well-nourished. No distress.  HENT:  Head: Normocephalic.  Eyes: Pupils are equal, round, and reactive to light.  Neck: Normal range of motion. Neck supple.  Cardiovascular: Normal rate.   Respiratory: Effort normal.  GI: Soft. She exhibits no distension. There is no tenderness. There is no rebound and no guarding.  Genitourinary:  Large irregular reddened slightly ulcerated appearing area on right labia majora oozing small amount of discharge; vaginal mucosa mildly reddened with sm- mod amount of creamy discharge in vault; cervix parous, clean, NT; uterus NSSC, NT; adnexa without palpable enlargement or tenderness; mild left mid abd discomfort with palpation- no rebound- not really feeling it at this time. No lymphadneopathy noted  Musculoskeletal: Normal range of motion.  Neurological: She is alert and oriented to person, place, and time.  Skin: Skin is warm and dry.  Psychiatric: She has a normal mood and affect.    MAU Course  Procedures HSV culture pending- will treat presumptively; Valtrex 1 gm in MAU then 1 BID for 10 days GC/chlamydia Wet prep Lidocaine jelly topically Results for orders placed or performed during the hospital encounter of 06/12/15 (from the past 24 hour(s))  Urinalysis, Routine w reflex microscopic (not at [facility])     Status: Abnormal   Collection Time: 06/12/15 11:15 AM  Result Value Ref Range   Color, Urine YELLOW YELLOW   APPearance CLOUDY (A) CLEAR   Specific Gravity, Urine >1.030 (H) 1.005 - 1.030   pH 5.5 5.0 - 8.0     Glucose, UA NEGATIVE NEGATIVE mg/dL   Hgb urine dipstick NEGATIVE NEGATIVE   Bilirubin Urine NEGATIVE NEGATIVE   Ketones, ur 15 (A) NEGATIVE mg/dL   Protein, ur NEGATIVE NEGATIVE mg/dL   Urobilinogen, UA 0.2 0.0 - 1.0 mg/dL   Nitrite NEGATIVE NEGATIVE   Leukocytes, UA SMALL (A) NEGATIVE  Urine microscopic-add on     Status: Abnormal   Collection Time: 06/12/15 11:15 AM  Result Value Ref Range   Squamous Epithelial / LPF MANY (A) RARE   WBC, UA 3-6 <3 WBC/hpf   RBC / HPF 0-2 <3 RBC/hpf   Bacteria, UA FEW (A) RARE   Urine-Other MUCOUS PRESENT   Pregnancy, urine POC     Status: None   Collection Time: 06/12/15 11:17 AM  Result Value Ref  Range   Preg Test, Ur NEGATIVE NEGATIVE  CBC     Status: Abnormal   Collection Time: 06/12/15 12:50 PM  Result Value Ref Range   WBC 9.7 4.0 - 10.5 K/uL   RBC 4.82 3.87 - 5.11 MIL/uL   Hemoglobin 12.6 12.0 - 15.0 g/dL   HCT 39.2 36.0 - 46.0 %   MCV 81.3 78.0 - 100.0 fL   MCH 26.1 26.0 - 34.0 pg   MCHC 32.1 30.0 - 36.0 g/dL   RDW 14.0 11.5 - 15.5 %   Platelets 482 (H) 150 - 400 K/uL  Wet prep, genital     Status: Abnormal   Collection Time: 06/12/15  2:20 PM  Result Value Ref Range   Yeast Wet Prep HPF POC NONE SEEN NONE SEEN   Trich, Wet Prep NONE SEEN NONE SEEN   Clue Cells Wet Prep HPF POC MODERATE (A) NONE SEEN   WBC, Wet Prep HPF POC FEW (A) NONE SEEN  specimens for wet prep and HSV and GC/Chlamydia were mislabeled and had to be recollected. Not much exudate on lesion for HSV culture  Assessment and Plan  Vaginal lesion- Valtrex 1 gm BID for 10 days; HSV culture pending BV- Flagyl 500,g BID for 7 days abd cramping- increase in fiber Diabetes- f/u with Dr. Chalmers Cater- discussed need for strict BS control F/u for increase pain, chills or fever LINEBERRY,SUSAN 06/12/2015, 1:22 PM

## 2015-06-13 LAB — HIV ANTIBODY (ROUTINE TESTING W REFLEX): HIV Screen 4th Generation wRfx: NONREACTIVE

## 2015-06-15 LAB — HERPES SIMPLEX VIRUS CULTURE
Culture: NOT DETECTED
Special Requests: NORMAL

## 2015-06-15 LAB — GC/CHLAMYDIA PROBE AMP (~~LOC~~) NOT AT ARMC
Chlamydia: POSITIVE — AB
Neisseria Gonorrhea: NEGATIVE

## 2015-06-16 ENCOUNTER — Telehealth: Payer: Self-pay | Admitting: *Deleted

## 2015-06-16 DIAGNOSIS — A749 Chlamydial infection, unspecified: Secondary | ICD-10-CM

## 2015-06-16 MED ORDER — AZITHROMYCIN 500 MG PO TABS: ORAL_TABLET | ORAL | Status: DC

## 2015-06-16 NOTE — Telephone Encounter (Signed)
Patient returned call, notified her of positive chlamydia culture.  Rx routed to pharmacy per protocol.  Instructed patient to notify her partner for treatment and to abstain from sex for seven days post treatment.  Report of treatment faxed to health department.

## 2015-07-28 ENCOUNTER — Emergency Department (HOSPITAL_COMMUNITY)
Admission: EM | Admit: 2015-07-28 | Discharge: 2015-07-28 | Disposition: A | Discharge status: Home / Self Care | Payer: Medicaid Other | Attending: Emergency Medicine | Admitting: Emergency Medicine

## 2015-07-28 ENCOUNTER — Encounter (HOSPITAL_COMMUNITY): Payer: Self-pay | Admitting: Emergency Medicine

## 2015-07-28 DIAGNOSIS — Z87891 Personal history of nicotine dependence: Secondary | ICD-10-CM | POA: Diagnosis not present

## 2015-07-28 DIAGNOSIS — Z8619 Personal history of other infectious and parasitic diseases: Secondary | ICD-10-CM | POA: Diagnosis not present

## 2015-07-28 DIAGNOSIS — Z8659 Personal history of other mental and behavioral disorders: Secondary | ICD-10-CM | POA: Diagnosis not present

## 2015-07-28 DIAGNOSIS — M62830 Muscle spasm of back: Secondary | ICD-10-CM | POA: Diagnosis not present

## 2015-07-28 DIAGNOSIS — E119 Type 2 diabetes mellitus without complications: Secondary | ICD-10-CM | POA: Diagnosis not present

## 2015-07-28 DIAGNOSIS — E669 Obesity, unspecified: Secondary | ICD-10-CM | POA: Insufficient documentation

## 2015-07-28 DIAGNOSIS — G8929 Other chronic pain: Secondary | ICD-10-CM | POA: Insufficient documentation

## 2015-07-28 DIAGNOSIS — Z792 Long term (current) use of antibiotics: Secondary | ICD-10-CM | POA: Diagnosis not present

## 2015-07-28 DIAGNOSIS — M545 Low back pain: Secondary | ICD-10-CM | POA: Diagnosis present

## 2015-07-28 MED ORDER — OXYCODONE-ACETAMINOPHEN 5-325 MG PO TABS: 2.0000 | ORAL_TABLET | ORAL | Status: DC | PRN

## 2015-07-28 MED ORDER — OXYCODONE-ACETAMINOPHEN 5-325 MG PO TABS
1.0000 | ORAL_TABLET | Freq: Once | ORAL | Status: AC
  Administered 2015-07-28: 1 via ORAL
  Filled 2015-07-28: qty 1

## 2015-07-28 MED ORDER — CYCLOBENZAPRINE HCL 10 MG PO TABS: 10.0000 mg | ORAL_TABLET | Freq: Two times a day (BID) | ORAL | Status: DC | PRN

## 2015-07-28 NOTE — Discharge Instructions (Signed)
Back Exercises The following exercises strengthen the muscles that help to support the back. They also help to keep the lower back flexible. Doing these exercises can help to prevent back pain or lessen existing pain. If you have back pain or discomfort, try doing these exercises 2-3 times each day or as told by your health care provider. When the pain goes away, do them once each day, but increase the number of times that you repeat the steps for each exercise (do more repetitions). If you do not have back pain or discomfort, do these exercises once each day or as told by your health care provider. EXERCISES Single Knee to Chest Repeat these steps 3-5 times for each leg:  Lie on your back on a firm bed or the floor with your legs extended.  Bring one knee to your chest. Your other leg should stay extended and in contact with the floor.  Hold your knee in place by grabbing your knee or thigh.  Pull on your knee until you feel a gentle stretch in your lower back.  Hold the stretch for 10-30 seconds.  Slowly release and straighten your leg. Pelvic Tilt Repeat these steps 5-10 times:  Lie on your back on a firm bed or the floor with your legs extended.  Bend your knees so they are pointing toward the ceiling and your feet are flat on the floor.  Tighten your lower abdominal muscles to press your lower back against the floor. This motion will tilt your pelvis so your tailbone points up toward the ceiling instead of pointing to your feet or the floor.  With gentle tension and even breathing, hold this position for 5-10 seconds. Cat-Cow Repeat these steps until your lower back becomes more flexible:  Get into a hands-and-knees position on a firm surface. Keep your hands under your shoulders, and keep your knees under your hips. You may place padding under your knees for comfort.  Let your head hang down, and point your tailbone toward the floor so your lower back becomes rounded like the  back of a cat.  Hold this position for 5 seconds.  Slowly lift your head and point your tailbone up toward the ceiling so your back forms a sagging arch like the back of a cow.  Hold this position for 5 seconds. Press-Ups Repeat these steps 5-10 times:  Lie on your abdomen (face-down) on the floor.  Place your palms near your head, about shoulder-width apart.  While you keep your back as relaxed as possible and keep your hips on the floor, slowly straighten your arms to raise the top half of your body and lift your shoulders. Do not use your back muscles to raise your upper torso. You may adjust the placement of your hands to make yourself more comfortable.  Hold this position for 5 seconds while you keep your back relaxed.  Slowly return to lying flat on the floor. Bridges Repeat these steps 10 times:  Lie on your back on a firm surface.  Bend your knees so they are pointing toward the ceiling and your feet are flat on the floor.  Tighten your buttocks muscles and lift your buttocks off of the floor until your waist is at almost the same height as your knees. You should feel the muscles working in your buttocks and the back of your thighs. If you do not feel these muscles, slide your feet 1-2 inches farther away from your buttocks.  Hold this position for 3-5  seconds.  Slowly lower your hips to the starting position, and allow your buttocks muscles to relax completely. If this exercise is too easy, try doing it with your arms crossed over your chest. Abdominal Crunches Repeat these steps 5-10 times: 1. Lie on your back on a firm bed or the floor with your legs extended. 2. Bend your knees so they are pointing toward the ceiling and your feet are flat on the floor. 3. Cross your arms over your chest. 4. Tip your chin slightly toward your chest without bending your neck. 5. Tighten your abdominal muscles and slowly raise your trunk (torso) high enough to lift your shoulder blades  a tiny bit off of the floor. Avoid raising your torso higher than that, because it can put too much stress on your low back and it does not help to strengthen your abdominal muscles. 6. Slowly return to your starting position. Back Lifts Repeat these steps 5-10 times: 1. Lie on your abdomen (face-down) with your arms at your sides, and rest your forehead on the floor. 2. Tighten the muscles in your legs and your buttocks. 3. Slowly lift your chest off of the floor while you keep your hips pressed to the floor. Keep the back of your head in line with the curve in your back. Your eyes should be looking at the floor. 4. Hold this position for 3-5 seconds. 5. Slowly return to your starting position. SEEK MEDICAL CARE IF:  Your back pain or discomfort gets much worse when you do an exercise.  Your back pain or discomfort does not lessen within 2 hours after you exercise. If you have any of these problems, stop doing these exercises right away. Do not do them again unless your health care provider says that you can. SEEK IMMEDIATE MEDICAL CARE IF:  You develop sudden, severe back pain. If this happens, stop doing the exercises right away. Do not do them again unless your health care provider says that you can.   This information is not intended to replace advice given to you by your health care provider. Make sure you discuss any questions you have with your health care provider.   Document Released: 09/15/2004 Document Revised: 04/29/2015 Document Reviewed: 10/02/2014 Elsevier Interactive Patient Education 2016 [employer]  Muscle Cramps and Spasms Muscle cramps and spasms are when muscles tighten by themselves. They usually get better within minutes. Muscle cramps are painful. They are usually stronger and last longer than muscle spasms. Muscle spasms may or may not be painful. They can last a few seconds or much longer. HOME CARE  Drink enough fluid to keep your pee (urine) clear or pale  yellow.  Massage, stretch, and relax the muscle.  Use a warm towel, heating pad, or warm shower water on tight muscles.  Place ice on the muscle if it is tender or in pain.  Put ice in a plastic bag.  Place a towel between your skin and the bag.  Leave the ice on for 15-20 minutes, 03-04 times a day.  Only take medicine as told by your doctor. GET HELP RIGHT AWAY IF:  Your cramps or spasms get worse, happen more often, or do not get better with time. MAKE SURE YOU:  Understand these instructions.  Will watch your condition.  Will get help right away if you are not doing well or get worse.   This information is not intended to replace advice given to you by your health care provider. Make sure you  discuss any questions you have with your health care provider.   Document Released: 07/21/2008 Document Revised: 12/03/2012 Document Reviewed: 07/25/2012 Elsevier Interactive Patient Education 2016 [place] therapy can help ease sore, stiff, injured, and tight muscles and joints. Heat relaxes your muscles, which may help ease your pain.  RISKS AND COMPLICATIONS If you have any of the following conditions, do not use heat therapy unless your health care provider has approved:  Poor circulation.  Healing wounds or scarred skin in the area being treated.  Diabetes, heart disease, or high blood pressure.  Not being able to feel (numbness) the area being treated.  Unusual swelling of the area being treated.  Active infections.  Blood clots.  Cancer.  Inability to communicate pain. This may include young children and people who have problems with their brain function (dementia).  Pregnancy. Heat therapy should only be used on old, pre-existing, or long-lasting (chronic) injuries. Do not use heat therapy on new injuries unless directed by your health care provider. HOW TO USE HEAT THERAPY There are several different kinds of heat therapy,  including:  Moist heat pack.  Warm water bath.  Hot water bottle.  Electric heating pad.  Heated gel pack.  Heated wrap.  Electric heating pad. Use the heat therapy method suggested by your health care provider. Follow your health care provider's instructions on when and how to use heat therapy. GENERAL HEAT THERAPY RECOMMENDATIONS  Do not sleep while using heat therapy. Only use heat therapy while you are awake.  Your skin may turn pink while using heat therapy. Do not use heat therapy if your skin turns red.  Do not use heat therapy if you have new pain.  High heat or long exposure to heat can cause burns. Be careful when using heat therapy to avoid burning your skin.  Do not use heat therapy on areas of your skin that are already irritated, such as with a rash or sunburn. SEEK MEDICAL CARE IF:  You have blisters, redness, swelling, or numbness.  You have new pain.  Your pain is worse. MAKE SURE YOU:  Understand these instructions.  Will watch your condition.  Will get help right away if you are not doing well or get worse.   This information is not intended to replace advice given to you by your health care provider. Make sure you discuss any questions you have with your health care provider.   Follow-up with your primary care provider if symptoms not improved. Return to the emergency department if you experience bowel or bladder incontinence, numbness or tingling in both extremities, inability to walk, fever, burning with urination.

## 2015-07-28 NOTE — ED Provider Notes (Signed)
CSN: [id number]     Arrival date & time 07/28/15  0915 History   First MD Initiated Contact with Patient 07/28/15 1009     Chief Complaint  Patient presents with  . back spasms      (Consider location/radiation/quality/duration/timing/severity/associated sxs/prior Treatment) HPI   Phyllis Dalton is a 24 year old female with a past medical history of chronic back pain, sciatica who presents the emergency department today complaining of back pain. Patient states that she was moving an oven yesterday morning when she had acute onset right-sided lower back pain with associated paresthesias in her right lower extremity. Patient has a long history of this type of pain, since she was a teenager. Patient feels that this is a typical flare for her. Patient is scheduled to the chiropractor in the near future was unable to see him between yesterday and today. Denies bowel or bladder incontinence, saddle anesthesia, dysuria, weakness, abdominal pain, nausea, vomiting. Patient is able to ambulate without difficulty.  Past Medical History  Diagnosis Date  . Panic attack   . Obesity   . Depression   . Trichomonal vaginitis   . Diabetes mellitus without complication [facility])    Past Surgical History  Procedure Laterality Date  . Mouth surgery    . Tonsillectomy    . Addenoidectomy     Family History  Problem Relation Age of Onset  . Diabetes Other   . Diabetes Mother   . HIV Mother    Social History  Substance Use Topics  . Smoking status: Former Smoker    Types: Cigarettes    Quit date: 12/25/2012  . Smokeless tobacco: Never Used  . Alcohol Use: No     Comment: rare   OB History    Gravida Para Term Preterm AB TAB SAB Ectopic Multiple Living   2 2 2  0 0 0 0 0 0 2     Review of Systems  All other systems reviewed and are negative.     Allergies  Review of patient's allergies indicates no known allergies.  Home Medications   Prior to Admission medications   Medication Sig  Start Date End Date Taking? Authorizing Provider  azithromycin (ZITHROMAX) 500 MG tablet Take two tablets by mouth once 06/16/15   Donnamae Jude, MD  etonogestrel (IMPLANON) 68 MG IMPL implant Inject 1 each into the skin once.    Historical Provider, MD  ibuprofen (ADVIL,MOTRIN) 600 MG tablet Take 1 tablet (600 mg total) by mouth every 6 (six) hours as needed. Patient not taking: Reported on 06/12/2015 07/10/14   Carlisle Cater, PA-C  metroNIDAZOLE (FLAGYL) 500 MG tablet Take 1 tablet (500 mg total) by mouth 2 (two) times daily. 06/12/15   [place] Pugh, NP   BP 110/75 mmHg  Pulse 92  Temp(Src) 98 F (36.7 C) (Oral)  Resp 16  SpO2 99% Physical Exam  Constitutional: She is oriented to person, place, and time. She appears well-developed and well-nourished. No distress.  HENT:  Head: Normocephalic and atraumatic.  Eyes: Conjunctivae are normal. Right eye exhibits no discharge. Left eye exhibits no discharge. No scleral icterus.  Neck: Neck supple.  Cardiovascular: Normal rate.   Pulmonary/Chest: Effort normal.  Musculoskeletal:       Back:  No midline spinal tenderness.  Lymphadenopathy:    She has no cervical adenopathy.  Neurological: She is alert and oriented to person, place, and time. Coordination normal.  Strength 5/5 throughout. No sensory deficits.  No gait abnormality.  Skin: Skin is warm and  dry. No rash noted. She is not diaphoretic. No erythema. No pallor.  Psychiatric: She has a normal mood and affect. Her behavior is normal.  Nursing note and vitals reviewed.   ED Course  Procedures (including critical care time) Labs Review Labs Reviewed - No data to display  Imaging Review No results found. I have personally reviewed and evaluated these images and lab results as part of my medical decision-making.   EKG Interpretation None      MDM   Final diagnoses:  Muscle spasm of back    Patient presents with back pain. Patient has long-standing history of  chronic back pain and sciatica. Suspect chronic pain exacerbation.  No neurological deficits and normal neuro exam.  Patient can walk but states is painful.  No loss of bowel or bladder control, saddle anesthesia.  No concern for cauda equina.  No fever, night sweats, weight loss, h/o cancer, IVDU.  RICE protocol and pain medicine indicated and discussed with patient. Return precautions outlined in patient discharge instructions. Patient has primary care follow-up.     Dondra Spry [place], PA-C 07/28/15 [place], PA-C 07/28/15 [place], MD 07/28/15 1510

## 2015-07-28 NOTE — ED Notes (Signed)
Per pt, states back spasms since yesterday

## 2015-08-17 ENCOUNTER — Inpatient Hospital Stay (HOSPITAL_COMMUNITY)
Admission: AD | Admit: 2015-08-17 | Discharge: 2015-08-17 | Disposition: A | Discharge status: Home / Self Care | Payer: Medicaid Other | Source: Ambulatory Visit | Attending: Obstetrics and Gynecology | Admitting: Obstetrics and Gynecology

## 2015-08-17 ENCOUNTER — Encounter (HOSPITAL_COMMUNITY): Payer: Self-pay

## 2015-08-17 DIAGNOSIS — Z87891 Personal history of nicotine dependence: Secondary | ICD-10-CM | POA: Insufficient documentation

## 2015-08-17 DIAGNOSIS — A599 Trichomoniasis, unspecified: Secondary | ICD-10-CM | POA: Diagnosis not present

## 2015-08-17 DIAGNOSIS — N898 Other specified noninflammatory disorders of vagina: Secondary | ICD-10-CM | POA: Diagnosis not present

## 2015-08-17 DIAGNOSIS — N766 Ulceration of vulva: Secondary | ICD-10-CM | POA: Diagnosis present

## 2015-08-17 DIAGNOSIS — Z833 Family history of diabetes mellitus: Secondary | ICD-10-CM | POA: Diagnosis not present

## 2015-08-17 DIAGNOSIS — R319 Hematuria, unspecified: Secondary | ICD-10-CM | POA: Insufficient documentation

## 2015-08-17 LAB — URINALYSIS, ROUTINE W REFLEX MICROSCOPIC
Bilirubin Urine: NEGATIVE
Glucose, UA: NEGATIVE mg/dL
Ketones, ur: NEGATIVE mg/dL
Leukocytes, UA: NEGATIVE
Nitrite: NEGATIVE
Protein, ur: NEGATIVE mg/dL
Specific Gravity, Urine: >1.03 — ABNORMAL HIGH (ref 1.005–1.030)
pH: 5.5 (ref 5.0–8.0)

## 2015-08-17 LAB — URINE MICROSCOPIC-ADD ON

## 2015-08-17 LAB — POCT PREGNANCY, URINE: Preg Test, Ur: NEGATIVE

## 2015-08-17 MED ORDER — SULFAMETHOXAZOLE-TRIMETHOPRIM 400-80 MG PO TABS: 1.0000 | ORAL_TABLET | Freq: Two times a day (BID) | ORAL | Status: DC

## 2015-08-17 MED ORDER — METRONIDAZOLE 500 MG PO TABS
2000.0000 mg | ORAL_TABLET | Freq: Once | ORAL | Status: AC
Start: 2015-08-17 — End: 2015-08-17
  Administered 2015-08-17: 2000 mg via ORAL
  Filled 2015-08-17: qty 4

## 2015-08-17 NOTE — MAU Provider Note (Signed)
History     CSN: [id number]  Arrival date and time: 08/17/15 1512   First Provider Initiated Contact with Patient 08/17/15 1606      Chief Complaint  Patient presents with  . ? skin irritation    HPI  Phyllis Dalton 24 y.o. [id number] presents with complaint of painful ulceration on her right upper labia that has been there since October this year. She states is will drain sometimes and sometimes it is firm.  Past Medical History  Diagnosis Date  . Panic attack   . Obesity   . Depression   . Trichomonal vaginitis   . Diabetes mellitus without complication [facility])     Past Surgical History  Procedure Laterality Date  . Mouth surgery    . Tonsillectomy    . Addenoidectomy      Family History  Problem Relation Age of Onset  . Diabetes Other   . Diabetes Mother   . HIV Mother     Social History  Substance Use Topics  . Smoking status: Former Smoker    Types: Cigarettes    Quit date: 12/25/2012  . Smokeless tobacco: Never Used  . Alcohol Use: No     Comment: rare    Allergies: No Known Allergies  Prescriptions prior to admission  Medication Sig Dispense Refill Last Dose  . azithromycin (ZITHROMAX) 500 MG tablet Take two tablets by mouth once (Patient not taking: Reported on 08/17/2015) 2 tablet 0 Completed Course at Unknown time  . cyclobenzaprine (FLEXERIL) 10 MG tablet Take 1 tablet (10 mg total) by mouth 2 (two) times daily as needed for muscle spasms. (Patient not taking: Reported on 08/17/2015) 20 tablet 0 Completed Course at Unknown time  . etonogestrel (IMPLANON) 68 MG IMPL implant Inject 1 each into the skin once.   02/07/2015 at Unknown time  . ibuprofen (ADVIL,MOTRIN) 600 MG tablet Take 1 tablet (600 mg total) by mouth every 6 (six) hours as needed. (Patient not taking: Reported on 08/17/2015) 20 tablet 0 Not Taking at Unknown time  . metroNIDAZOLE (FLAGYL) 500 MG tablet Take 1 tablet (500 mg total) by mouth 2 (two) times daily. (Patient not taking:  Reported on 08/17/2015) 14 tablet 0 Completed Course at Unknown time  . oxyCODONE-acetaminophen (PERCOCET/ROXICET) 5-325 MG tablet Take 2 tablets by mouth every 4 (four) hours as needed for severe pain. (Patient not taking: Reported on 08/17/2015) 6 tablet 0 Completed Course at Unknown time    Review of Systems  Constitutional: Negative for fever.  Genitourinary:       Vaginal lesion  All other systems reviewed and are negative.  Physical Exam   Blood pressure 140/75, pulse 79, temperature 98.6 F (37 C), temperature source Oral, resp. rate 18, not currently breastfeeding.  Physical Exam  Nursing note and vitals reviewed. Constitutional: She is oriented to person, place, and time. She appears well-developed and well-nourished. No distress.  HENT:  Head: Normocephalic and atraumatic.  Cardiovascular: Normal rate.   Respiratory: Effort normal and breath sounds normal. No respiratory distress.  GI: Soft. There is no tenderness.  Genitourinary:     Ulcerated , macerated pale in color, firm, not draining mass approx 3-4 cm x 1-2 cm.   Neurological: She is alert and oriented to person, place, and time.  Skin: Skin is warm and dry.  Psychiatric: She has a normal mood and affect. Her behavior is normal. Judgment and thought content normal.   Results for orders placed or performed during the hospital encounter of 08/17/15 (  from the past 24 hour(s))  Urinalysis, Routine w reflex microscopic (not at [facility])     Status: Abnormal   Collection Time: 08/17/15  3:30 PM  Result Value Ref Range   Color, Urine YELLOW YELLOW   APPearance CLEAR CLEAR   Specific Gravity, Urine >1.030 (H) 1.005 - 1.030   pH 5.5 5.0 - 8.0   Glucose, UA NEGATIVE NEGATIVE mg/dL   Hgb urine dipstick LARGE (A) NEGATIVE   Bilirubin Urine NEGATIVE NEGATIVE   Ketones, ur NEGATIVE NEGATIVE mg/dL   Protein, ur NEGATIVE NEGATIVE mg/dL   Nitrite NEGATIVE NEGATIVE   Leukocytes, UA NEGATIVE NEGATIVE  Urine microscopic-add on      Status: Abnormal   Collection Time: 08/17/15  3:30 PM  Result Value Ref Range   Squamous Epithelial / LPF 6-30 (A) NONE SEEN   WBC, UA 0-5 0 - 5 WBC/hpf   RBC / HPF 0-5 0 - 5 RBC/hpf   Bacteria, UA MANY (A) NONE SEEN   Urine-Other TRICHOMONAS PRESENT   Pregnancy, urine POC     Status: None   Collection Time: 08/17/15  3:35 PM  Result Value Ref Range   Preg Test, Ur NEGATIVE NEGATIVE   MAU Course  Procedures  MDM Will treat pt with bactrim DS x 7 days. Will treat pt with Flagyl 2 gms PO here.  Have pt seen in [facility] GYN clinic this week Wednesday 08/19/15 @ 100pm  for possible biopsy and then Referral  To I/D Pending labs HSV serology and by PCR, RPR, GC ch, urine culture. Today urine showed trichomonas and will treat .Advised partner treatment. Dr Elly Modena consulted and aware of POC. Marland Kitchenpartner Assessment and Plan  Trichomoniasis Vaginal Lesion Hematuria Bactrim DS x & days Expedited Partner treatment Flagyl 2 gms PO Follow up [facility] Discharge      Dalton,Phyllis Grissett 08/17/2015, 4:37 PM

## 2015-08-17 NOTE — Discharge Instructions (Signed)
Trichomoniasis °Trichomoniasis is an infection caused by an organism called Trichomonas. The infection can affect both women and men. In women, the outer female genitalia and the vagina are affected. In men, the penis is mainly affected, but the prostate and other reproductive organs can also be involved. Trichomoniasis is a sexually transmitted infection (STI) and is most often passed to another person through sexual contact.  °RISK FACTORS °· Having unprotected sexual intercourse. °· Having sexual intercourse with an infected partner. °SIGNS AND SYMPTOMS  °Symptoms of trichomoniasis in women include: °· Abnormal gray-green frothy vaginal discharge. °· Itching and irritation of the vagina. °· Itching and irritation of the area outside the vagina. °Symptoms of trichomoniasis in men include:  °· Penile discharge with or without pain. °· Pain during urination. This results from inflammation of the urethra. °DIAGNOSIS  °Trichomoniasis may be found during a Pap test or physical exam. Your health care provider may use one of the following methods to help diagnose this infection: °· Testing the pH of the vagina with a test tape. °· Using a vaginal swab test that checks for the Trichomonas organism. A test is available that provides results within a few minutes. °· Examining a urine sample. °· Testing vaginal secretions. °Your health care provider may test you for other STIs, including HIV. °TREATMENT  °· You may be given medicine to fight the infection. Women should inform their health care provider if they could be or are pregnant. Some medicines used to treat the infection should not be taken during pregnancy. °· Your health care provider may recommend over-the-counter medicines or creams to decrease itching or irritation. °· Your sexual partner will need to be treated if infected. °· Your health care provider may test you for infection again 3 months after treatment. °HOME CARE INSTRUCTIONS  °· Take medicines only as  directed by your health care provider. °· Take over-the-counter medicine for itching or irritation as directed by your health care provider. °· Do not have sexual intercourse while you have the infection. °· Women should not douche or wear tampons while they have the infection. °· Discuss your infection with your partner. Your partner may have gotten the infection from you, or you may have gotten it from your partner. °· Have your sex partner get examined and treated if necessary. °· Practice safe, informed, and protected sex. °· See your health care provider for other STI testing. °SEEK MEDICAL CARE IF:  °· You still have symptoms after you finish your medicine. °· You develop abdominal pain. °· You have pain when you urinate. °· You have bleeding after sexual intercourse. °· You develop a rash. °· Your medicine makes you sick or makes you throw up (vomit). °MAKE SURE YOU: °· Understand these instructions. °· Will watch your condition. °· Will get help right away if you are not doing well or get worse. °  °This information is not intended to replace advice given to you by your health care provider. Make sure you discuss any questions you have with your health care provider. °  °Document Released: 02/01/2001 Document Revised: 08/29/2014 Document Reviewed: 05/20/2013 °Elsevier Interactive Patient Education ©2016 Elsevier Inc. ° °Expedited Partner Therapy:  °Information Sheet for Patients and Partners  °            ° °You have been offered expedited partner therapy (EPT). This information sheet contains important information and warnings you need to be aware of, so please read it carefully.  ° °Expedited Partner Therapy (EPT) is the   clinical practice of treating the sexual partners of persons who receive chlamydia, gonorrhea, or trichomoniasis diagnoses by providing medications or prescriptions to the patient. Patients then provide partners with these therapies without the health-care provider having examined the  partner. In other words, EPT is a convenient, fast and private way for patients to help their sexual partners get treated.  ° °Chlamydia and gonorrhea are bacterial infections you get from having sex with a person who is already infected. Trichomoniasis (or “trich”) is a very common sexually transmitted infection (STI) that is caused by infection with a protozoan parasite called Trichomonas vaginalis.  Many people with these infections don’t know it because they feel fine, but without treatment these infections can cause serious health problems, such as pelvic inflammatory disease, ectopic pregnancy, infertility and increased risk of HIV.  ° °It is important to get treated as soon as possible to protect your health, to avoid spreading these infections to others, and to prevent yourself from becoming re-infected. The good news is these infections can be easily cured with proper antibiotic medicine. The best way to take care of your self is to see a doctor or go to your local health department. If you are not able to see a doctor or other medical provider, you should take EPT.  ° ° °Recommended Medication: °EPT for Chlamydia:  Azithromycin (Zithromax) 1 gram orally in a single dose °EPT for Gonorrhea:  Cefixime (Suprax) 400 milligrams orally in a single dose PLUS azithromycin (Zithromax) 1 gram orally in a single dose °EPT for Trichomoniasis:  Metronidazole (Flagyl) 2 grams orally in a single dose ° ° °These medicines are very safe. However, you should not take them if you have ever had an allergic reaction (like a rash) to any of these medicines: azithromycin (Zithromax), erythromycin, clarithromycin (Biaxin), metronidazole (Flagyl), tinidazole (Tindimax). If you are uncertain about whether you have an allergy, call your medical provider or pharmacist before taking this medicine. If you have a serious, long-term illness like kidney, liver or heart disease, colitis or stomach problems, or you are currently taking  other prescription medication, talk to your provider before taking this medication.  ° °Women: If you have lower belly pain, pain during sex, vomiting, or a fever, do not take this medicine. Instead, you should see a medical provider to be certain you do not have pelvic inflammatory disease (PID). PID can be serious and lead to infertility, pregnancy problems or chronic pelvic pain.  ° °Pregnant Women: It is very important for you to see a doctor to get pregnancy services and pre-natal care. These antibiotics for EPT are safe for pregnant women, but you still need to see a medical provider as soon as possible. It is also important to note that Doxycycline is an alternative therapy for chlamydia, but it should not be taken by someone who is pregnant.  ° °Men: If you have pain or swelling in the testicles or a fever, do not take this medicine and see a medical provider.    ° °Men who have sex with men (MSM): MSM in [place] continue to experience high rates of syphilis and HIV. Many MSM with gonorrhea or chlamydia could also have syphilis and/or HIV and not know it. If you are a man who has sex with other men, it is very important that you see a medical provider and are tested for HIV and syphilis. EPT is not recommended for gonorrhea for MSM.  Recommended treatment for gonorrhea for MSM is Rocephin (shot) AND   azithromycin due to decreased cure rate.  Please see your medical provider if this is the case.   ° °Along with this information sheet is a prescription for the medicine. If you receive a prescription it will be in your name and will indicate your date of birth, or it will be in the name of “Expedited Partner Therapy”.   In either case, you can have the prescription filled at a pharmacy. You will be responsible for the cost of the medicine, unless you have prescription drug coverage. In that case, you could provide your name so the pharmacy could bill your health plan.  ° °Take the medication as directed.  Some people will have a mild, upset stomach, which does not last long. AVOID alcohol 24 hours after taking metronidazole (Flagyl) to reduce the possibility of a disulfiram-like reaction (severe vomiting and abdominal pain).  After taking the medicine, do not have sex for 7 days. Do not share this medicine or give it to anyone else. It is important to tell everyone you have had sex with in the last 60 days that they need to go and get tested for sexually transmitted infections.  ° °Ways to prevent these and other sexually transmitted infections (STIs):  ° °• Abstain from sex. This is the only sure way to avoid getting an STI.  °• Use barrier methods, such as condoms, consistently and correctly.  °• Limit the number of sexual partners.  °• Have regular physical exams, including testing for STIs.  ° °For more information about EPT or other issues pertaining to an STI, please contact your medical provider or the [place] [facility] at [phone] or [email or web address]   ° °

## 2015-08-17 NOTE — MAU Note (Addendum)
Was here in Oct, "gray patch is still there". Is very painful.  Hx of herpes, but this area is about the size of a quarter, swollen,  And it bleeds. feels like you could pop it.

## 2015-08-18 LAB — URINE CULTURE: Special Requests: NORMAL

## 2015-08-18 LAB — RPR, QUANT+TP ABS (REFLEX)
Rapid Plasma Reagin, Quant: 1:128 {titer} — ABNORMAL HIGH
T Pallidum Abs: POSITIVE — AB

## 2015-08-18 LAB — HERPES SIMPLEX VIRUS(HSV) DNA BY PCR
HSV 1 DNA: NEGATIVE
HSV 2 DNA: NEGATIVE

## 2015-08-18 LAB — HSV 2 ANTIBODY, IGG: HSV 2 Glycoprotein G Ab, IgG: 14.9 index — ABNORMAL HIGH (ref 0.00–0.90)

## 2015-08-18 LAB — RPR: RPR Ser Ql: REACTIVE — AB

## 2015-08-18 LAB — HSV 1 ANTIBODY, IGG: HSV 1 Glycoprotein G Ab, IgG: 17.8 index — ABNORMAL HIGH (ref 0.00–0.90)

## 2015-08-19 ENCOUNTER — Telehealth: Payer: Self-pay | Admitting: Family

## 2015-08-19 ENCOUNTER — Encounter: Payer: Medicaid Other | Admitting: Obstetrics & Gynecology

## 2015-08-19 LAB — HERPES SIMPLEX VIRUS CULTURE
Culture: NOT DETECTED
Special Requests: NORMAL

## 2015-08-19 NOTE — Telephone Encounter (Signed)
Pt notified regarding +RPR and need to go to health department for treatment.  Pt instructed to call [phone] to schedule an appointment.  Explained potential sequelae regarding lack of treatment.  Pt verbalizes understanding and will call for appt today.  Also notified [phone] and informed regarding patient's positive test (suveillance).

## 2015-08-24 ENCOUNTER — Encounter: Payer: Medicaid Other | Admitting: Obstetrics & Gynecology

## 2015-08-31 ENCOUNTER — Encounter (HOSPITAL_COMMUNITY): Payer: Self-pay | Admitting: Emergency Medicine

## 2015-08-31 ENCOUNTER — Emergency Department (HOSPITAL_COMMUNITY)
Admission: EM | Admit: 2015-08-31 | Discharge: 2015-08-31 | Disposition: A | Discharge status: Home / Self Care | Payer: Medicaid Other | Attending: Emergency Medicine | Admitting: Emergency Medicine

## 2015-08-31 DIAGNOSIS — Z8619 Personal history of other infectious and parasitic diseases: Secondary | ICD-10-CM | POA: Diagnosis not present

## 2015-08-31 DIAGNOSIS — E669 Obesity, unspecified: Secondary | ICD-10-CM | POA: Diagnosis not present

## 2015-08-31 DIAGNOSIS — Z87891 Personal history of nicotine dependence: Secondary | ICD-10-CM | POA: Insufficient documentation

## 2015-08-31 DIAGNOSIS — Z792 Long term (current) use of antibiotics: Secondary | ICD-10-CM | POA: Insufficient documentation

## 2015-08-31 DIAGNOSIS — F410 Panic disorder [episodic paroxysmal anxiety] without agoraphobia: Secondary | ICD-10-CM | POA: Insufficient documentation

## 2015-08-31 DIAGNOSIS — E119 Type 2 diabetes mellitus without complications: Secondary | ICD-10-CM | POA: Insufficient documentation

## 2015-08-31 DIAGNOSIS — R42 Dizziness and giddiness: Secondary | ICD-10-CM | POA: Diagnosis present

## 2015-08-31 LAB — I-STAT CHEM 8, ED
BUN: 9 mg/dL (ref 6–20)
Calcium, Ion: 1.15 mmol/L (ref 1.12–1.23)
Chloride: 101 mmol/L (ref 101–111)
Creatinine, Ser: 0.6 mg/dL (ref 0.44–1.00)
Glucose, Bld: 94 mg/dL (ref 65–99)
HCT: 45 % (ref 36.0–46.0)
Hemoglobin: 15.3 g/dL — ABNORMAL HIGH (ref 12.0–15.0)
Potassium: 3.8 mmol/L (ref 3.5–5.1)
Sodium: 140 mmol/L (ref 135–145)
TCO2: 27 mmol/L (ref 0–100)

## 2015-08-31 MED ORDER — ACETAMINOPHEN 500 MG PO TABS
1000.0000 mg | ORAL_TABLET | Freq: Once | ORAL | Status: AC
  Administered 2015-08-31: 1000 mg via ORAL
  Filled 2015-08-31: qty 2

## 2015-08-31 NOTE — ED Notes (Signed)
PER EMS- coworker called EMS with concern of person having a panic attack. EMS on scene reported an elated respiratory rate for approx. 30 minutes. Respiratory rate noted at 50. BP decreased  And pt stated that she felt dizzy after respiratory rate deceased to 20. Pt. c/o new onset headache while in transit. Pt is AO x4 at present. Pt was ambulatory on site. Pt placed in reclining position due to light headedness.

## 2015-08-31 NOTE — ED Provider Notes (Signed)
CSN: [id number]     Arrival date & time 08/31/15  1120 History   First MD Initiated Contact with Patient 08/31/15 1125     Chief Complaint  Patient presents with  . Panic Attack    resolving panic attack while at work  . Headache  . Dizziness   Patient reports "I had a panic attack this morning" she states that she awakened this morning feeling that her breathing was "off slightly." She went on to work to develop more labored breathing and really she was having a panic attack. Associated symptoms include lightheadedness She was brought by EMS. No treatment prior to coming here. Presently has mild diffuse headache no other complaint. Patient admits to noncompliance with metformin and has not checked her blood sugar in several months. She feels much improved since being here.  (Consider location/radiation/quality/duration/timing/severity/associated sxs/prior Treatment) HPI  Past Medical History  Diagnosis Date  . Panic attack   . Obesity   . Depression   . Trichomonal vaginitis   . Diabetes mellitus without complication [facility])    Past Surgical History  Procedure Laterality Date  . Mouth surgery    . Tonsillectomy    . Addenoidectomy     Family History  Problem Relation Age of Onset  . Diabetes Other   . Diabetes Mother   . HIV Mother    Social History  Substance Use Topics  . Smoking status: Former Smoker    Types: Cigarettes    Quit date: 12/25/2012  . Smokeless tobacco: Never Used  . Alcohol Use: No     Comment: rare   OB History    Gravida Para Term Preterm AB TAB SAB Ectopic Multiple Living   2 2 2  0 0 0 0 0 0 2     Review of Systems  Respiratory: Positive for shortness of breath.   Allergic/Immunologic: Positive for immunocompromised state.       Diabetic  Neurological: Positive for light-headedness and headaches.  Psychiatric/Behavioral: The patient is nervous/anxious.       Allergies  Review of patient's allergies indicates no known allergies.  Home  Medications   Prior to Admission medications   Medication Sig Start Date End Date Taking? Authorizing Provider  etonogestrel (IMPLANON) 68 MG IMPL implant Inject 1 each into the skin once.    Historical Provider, MD  ibuprofen (ADVIL,MOTRIN) 600 MG tablet Take 1 tablet (600 mg total) by mouth every 6 (six) hours as needed. Patient not taking: Reported on 08/17/2015 07/10/14   Carlisle Cater, PA-C  sulfamethoxazole-trimethoprim (BACTRIM) 400-80 MG tablet Take 1 tablet by mouth 2 (two) times daily. 08/17/15   Lori A Clemmons, CNM   BP 104/53 mmHg  Pulse 76  Temp(Src) 98.7 F (37.1 C) (Oral)  Resp 18  Wt 246 lb (111.585 kg)  SpO2 100% Physical Exam  ED Course  Procedures (including critical care time) Labs Review Labs Reviewed - No data to display  Imaging Review No results found. I have personally reviewed and evaluated these images and lab results as part of my medical decision-making.   EKG Interpretation None     1:55 PM feels much improved after treatment with Tylenol feels ready to go home Results for orders placed or performed during the hospital encounter of 08/31/15  I-stat chem 8, ed  Result Value Ref Range   Sodium 140 135 - 145 mmol/L   Potassium 3.8 3.5 - 5.1 mmol/L   Chloride 101 101 - 111 mmol/L   BUN 9 6 - 20  mg/dL   Creatinine, Ser 0.60 0.44 - 1.00 mg/dL   Glucose, Bld 94 65 - 99 mg/dL   Calcium, Ion 1.15 1.12 - 1.23 mmol/L   TCO2 27 0 - 100 mmol/L   Hemoglobin 15.3 (H) 12.0 - 15.0 g/dL   HCT 45.0 36.0 - 46.0 %   No results found.  MDM  Plan follow-up with Dr. Criss Rosales  Diagnosis panic attack Final diagnoses:  None        Orlie Dakin, MD 08/31/15 1359

## 2015-08-31 NOTE — Discharge Instructions (Signed)
Call Dr. Criss Rosales to schedule an office visit. Your blood sugar today was 94 which was normal. Asked Dr. Criss Rosales ordered test known as hemoglobin A1c to check your blood sugar has been doing over the past several weeks. This may help in determining whether or not you need to continue diabetic medications. Panic Attacks Panic attacks are sudden, short feelings of great fear or discomfort. You may have them for no reason when you are relaxed, when you are uneasy (anxious), or when you are sleeping.  HOME CARE  Take all your medicines as told.  Check with your doctor before starting new medicines.  Keep all doctor visits. GET HELP IF:  You are not able to take your medicines as told.  Your symptoms do not get better.  Your symptoms get worse. GET HELP RIGHT AWAY IF:  Your attacks seem different than your normal attacks.  You have thoughts about hurting yourself or others.  You take panic attack medicine and you have a side effect. MAKE SURE YOU:  Understand these instructions.  Will watch your condition.  Will get help right away if you are not doing well or get worse.   This information is not intended to replace advice given to you by your health care provider. Make sure you discuss any questions you have with your health care provider.   Document Released: 09/10/2010 Document Revised: 05/29/2013 Document Reviewed: 03/22/2013 Elsevier Interactive Patient Education [employer].

## 2015-08-31 NOTE — ED Notes (Signed)
md at bedside

## 2015-08-31 NOTE — ED Notes (Signed)
Pt stated that she did not feel well this morning. She went to work. Pt felt an anxiety attack coming on. Pt stated that she started breathing fast. Headache started after she started breathing fast. Co-worker called rescue squad. Pt is currently very sleepy, easily aroused. Family at bedside

## 2015-08-31 NOTE — ED Notes (Signed)
Pt also reported that she did not take her metformin last night

## 2015-08-31 NOTE — ED Notes (Signed)
Pt alert and oriented x4. Respirations even and unlabored, bilateral symmetrical rise and fall of chest. Skin warm and dry. In no acute distress. Denies needs.   

## 2015-08-31 NOTE — ED Notes (Signed)
Pt escorted to discharge window. Pt verbalized understanding discharge instructions. In no acute distress.  

## 2016-02-13 ENCOUNTER — Encounter (HOSPITAL_COMMUNITY): Payer: Self-pay | Admitting: Emergency Medicine

## 2016-02-13 ENCOUNTER — Emergency Department (HOSPITAL_COMMUNITY)
Admission: EM | Admit: 2016-02-13 | Discharge: 2016-02-13 | Disposition: A | Discharge status: Home / Self Care | Payer: Medicaid Other | Attending: Emergency Medicine | Admitting: Emergency Medicine

## 2016-02-13 DIAGNOSIS — M5440 Lumbago with sciatica, unspecified side: Secondary | ICD-10-CM | POA: Diagnosis not present

## 2016-02-13 DIAGNOSIS — Z87891 Personal history of nicotine dependence: Secondary | ICD-10-CM | POA: Insufficient documentation

## 2016-02-13 DIAGNOSIS — E119 Type 2 diabetes mellitus without complications: Secondary | ICD-10-CM | POA: Insufficient documentation

## 2016-02-13 DIAGNOSIS — M545 Low back pain: Secondary | ICD-10-CM

## 2016-02-13 DIAGNOSIS — F329 Major depressive disorder, single episode, unspecified: Secondary | ICD-10-CM | POA: Insufficient documentation

## 2016-02-13 MED ORDER — METHOCARBAMOL 500 MG PO TABS: 500.0000 mg | ORAL_TABLET | Freq: Two times a day (BID) | ORAL | Status: DC

## 2016-02-13 MED ORDER — NAPROXEN 500 MG PO TABS: 500.0000 mg | ORAL_TABLET | Freq: Two times a day (BID) | ORAL | Status: DC

## 2016-02-13 NOTE — ED Provider Notes (Signed)
CSN: [id number]     Arrival date & time 02/13/16  1448 History   First MD Initiated Contact with Patient 02/13/16 1519     Chief Complaint  Patient presents with  . Back Pain  . Leg Pain     (Consider location/radiation/quality/duration/timing/severity/associated sxs/prior Treatment) HPI   25 year old obese female presenting for evaluation of low back pain. Patient reports she has history of chronic low back pain and occasional sciatica. 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 She works third shift. Throughout the night last night she reports gradual onset of worsening sharp throbbing pain to her low back which has now radiates to both of her legs. Pain is worse with certain movement and felt similar to prior sciatica. Pain is not adequately improved despite taking ibuprofen and Tylenol at home as well as using warm bath. She denies any associated fever, chills, abdominal pain, bowel bladder incontinence or anesthesia. No history of IV drug use or active cancer. Denies any recent injury to her back. Patient is currently not pregnant. Her orthopedist at [facility] orthopedic.  Past Medical History  Diagnosis Date  . Panic attack   . Obesity   . Depression   . Trichomonal vaginitis   . Diabetes mellitus without complication [facility])    Past Surgical History  Procedure Laterality Date  . Mouth surgery    . Tonsillectomy    . Addenoidectomy     Family History  Problem Relation Age of Onset  . Diabetes Other   . Diabetes Mother   . HIV Mother    Social History  Substance Use Topics  . Smoking status: Former Smoker    Types: Cigarettes    Quit date: 12/25/2012  . Smokeless tobacco: Never Used  . Alcohol Use: No     Comment: rare   OB History    Gravida Para Term Preterm AB TAB SAB Ectopic Multiple Living   2 2 2  0 0 0 0 0 0 2     Review of Systems  Constitutional: Negative for fever.  Musculoskeletal: Positive for back pain.   Neurological: Negative for numbness.      Allergies  Review of patient's allergies indicates no known allergies.  Home Medications   Prior to Admission medications   Medication Sig Start Date End Date Taking? Authorizing Provider  etonogestrel (IMPLANON) 68 MG IMPL implant Inject 1 each into the skin once.    Historical Provider, MD  ibuprofen (ADVIL,MOTRIN) 600 MG tablet Take 1 tablet (600 mg total) by mouth every 6 (six) hours as needed. Patient not taking: Reported on 08/17/2015 07/10/14   Carlisle Cater, PA-C  sulfamethoxazole-trimethoprim (BACTRIM) 400-80 MG tablet Take 1 tablet by mouth 2 (two) times daily. Patient not taking: Reported on 08/31/2015 08/17/15   Cecille Rubin A Clemmons, CNM   BP 129/82 mmHg  Pulse 94  Temp(Src) 98.6 F (37 C) (Oral)  Resp 18  SpO2 98% Physical Exam  Constitutional: She appears well-developed and well-nourished. No distress.  Obese African-American female sitting in the chair in no acute discomfort.  HENT:  Head: Atraumatic.  Eyes: Conjunctivae are normal.  Neck: Neck supple.  Abdominal: Soft. There is no tenderness.  Musculoskeletal: She exhibits tenderness (Tenderness to lumbar paraspinal muscle on palpation bilaterally with normal back flexion extension and rotation. Able to ambulate. No significant midline spine tenderness.).  Neurological: She is alert.  Negative straight leg raise. Patellar deep tendon reflex intact bilaterally, no foot drop. 5/5  Strength to bilateral lower extremities  Skin: No rash noted.  Psychiatric: She has a normal mood and affect.  Nursing note and vitals reviewed.   ED Course  Procedures (including critical care time)   MDM   Final diagnoses:  Bilateral low back pain, with sciatica presence unspecified    BP 129/82 mmHg  Pulse 94  Temp(Src) 98.6 F (37 C) (Oral)  Resp 18  SpO2 98%   3:29 PM No red flag s/s of low back pain. Patient was counseled on back pain precautions and told to do activity as  tolerated but do not lift, push, or pull heavy objects more than 10 pounds for the next week. Patient counseled to use ice or heat on back for no longer than 15 minutes every hour.   Patient prescribed muscle relaxer and counseled on proper use of muscle relaxant medication.  Patient prescribed nonnarcotic pain medicine   Urged patient not to drink alcohol, drive, or perform any other activities that requires focus while taking either of these medications.   Patient urged to follow-up with PCP if pain does not improve with treatment and rest or if pain becomes recurrent. Urged to return with worsening severe pain, loss of bowel or bladder control, trouble walking.  The patient verbalizes understanding and agrees with the plan.   Domenic Moras, PA-C 02/13/16 1535  Isla Pence, MD 02/13/16 [phone]

## 2016-02-13 NOTE — Discharge Instructions (Signed)
Back Pain, Adult °Back pain is very common in adults. The cause of back pain is rarely dangerous and the pain often gets better over time. The cause of your back pain may not be known. Some common causes of back pain include: °1. Strain of the muscles or ligaments supporting the spine. °2. Wear and tear (degeneration) of the spinal disks. °3. Arthritis. °4. Direct injury to the back. °For many people, back pain may return. Since back pain is rarely dangerous, most people can learn to manage this condition on their own. °HOME CARE INSTRUCTIONS °Watch your back pain for any changes. The following actions may help to lessen any discomfort you are feeling: °1. Remain active. It is stressful on your back to sit or stand in one place for long periods of time. Do not sit, drive, or stand in one place for more than 30 minutes at a time. Take short walks on even surfaces as soon as you are able. Try to increase the length of time you walk each day. °2. Exercise regularly as directed by your health care provider. Exercise helps your back heal faster. It also helps avoid future injury by keeping your muscles strong and flexible. °3. Do not stay in bed. Resting more than 1-2 days can delay your recovery. °4. Pay attention to your body when you bend and lift. The most comfortable positions are those that put less stress on your recovering back. Always use proper lifting techniques, including: °1. Bending your knees. °2. Keeping the load close to your body. °3. Avoiding twisting. °5. Find a comfortable position to sleep. Use a firm mattress and lie on your side with your knees slightly bent. If you lie on your back, put a pillow under your knees. °6. Avoid feeling anxious or stressed. Stress increases muscle tension and can worsen back pain. It is important to recognize when you are anxious or stressed and learn ways to manage it, such as with exercise. °7. Take medicines only as directed by your health care provider.  Over-the-counter medicines to reduce pain and inflammation are often the most helpful. Your health care provider may prescribe muscle relaxant drugs. These medicines help dull your pain so you can more quickly return to your normal activities and healthy exercise. °8. Apply ice to the injured area: °1. Put ice in a plastic bag. °2. Place a towel between your skin and the bag. °3. Leave the ice on for 20 minutes, 2-3 times a day for the first 2-3 days. After that, ice and heat may be alternated to reduce pain and spasms. °9. Maintain a healthy weight. Excess weight puts extra stress on your back and makes it difficult to maintain good posture. °SEEK MEDICAL CARE IF: °1. You have pain that is not relieved with rest or medicine. °2. You have increasing pain going down into the legs or buttocks. °3. You have pain that does not improve in one week. °4. You have night pain. °5. You lose weight. °6. You have a fever or chills. °SEEK IMMEDIATE MEDICAL CARE IF:  °1. You develop new bowel or bladder control problems. °2. You have unusual weakness or numbness in your arms or legs. °3. You develop nausea or vomiting. °4. You develop abdominal pain. °5. You feel faint. °  °This information is not intended to replace advice given to you by your health care provider. Make sure you discuss any questions you have with your health care provider. °  °Document Released: 08/08/2005 Document Revised: 08/29/2014 Document Reviewed: 12/10/2013 °Elsevier Interactive Patient Education ©2016 Elsevier   Inc[employer] ° °Back Exercises °If you have pain in your back, do these exercises 2-3 times each day or as told by your doctor. When the pain goes away, do the exercises once each day, but repeat the steps more times for each exercise (do more repetitions). If you do not have pain in your back, do these exercises once each day or as told by your doctor. °EXERCISES °Single Knee to Chest °Do these steps 3-5 times in a row for each leg: °5. Lie on your back  on a firm bed or the floor with your legs stretched out. °6. Bring one knee to your chest. °7. Hold your knee to your chest by grabbing your knee or thigh. °8. Pull on your knee until you feel a gentle stretch in your lower back. °9. Keep doing the stretch for 10-30 seconds. °10. Slowly let go of your leg and straighten it. °Pelvic Tilt °Do these steps 5-10 times in a row: °10. Lie on your back on a firm bed or the floor with your legs stretched out. °11. Bend your knees so they point up to the ceiling. Your feet should be flat on the floor. °12. Tighten your lower belly (abdomen) muscles to press your lower back against the floor. This will make your tailbone point up to the ceiling instead of pointing down to your feet or the floor. °13. Stay in this position for 5-10 seconds while you gently tighten your muscles and breathe evenly. °Cat-Cow °Do these steps until your lower back bends more easily: °7. Get on your hands and knees on a firm surface. Keep your hands under your shoulders, and keep your knees under your hips. You may put padding under your knees. °8. Let your head hang down, and make your tailbone point down to the floor so your lower back is round like the back of a cat. °9. Stay in this position for 5 seconds. °10. Slowly lift your head and make your tailbone point up to the ceiling so your back hangs low (sags) like the back of a cow. °11. Stay in this position for 5 seconds. °Press-Ups °Do these steps 5-10 times in a row: °6. Lie on your belly (face-down) on the floor. °7. Place your hands near your head, about shoulder-width apart. °8. While you keep your back relaxed and keep your hips on the floor, slowly straighten your arms to raise the top half of your body and lift your shoulders. Do not use your back muscles. To make yourself more comfortable, you may change where you place your hands. °9. Stay in this position for 5 seconds. °10. Slowly return to lying flat on the floor. °Bridges °Do these  steps 10 times in a row: °1. Lie on your back on a firm surface. °2. Bend your knees so they point up to the ceiling. Your feet should be flat on the floor. °3. Tighten your butt muscles and lift your butt off of the floor until your waist is almost as high as your knees. If you do not feel the muscles working in your butt and the back of your thighs, slide your feet 1-2 inches farther away from your butt. °4. Stay in this position for 3-5 seconds. °5. Slowly lower your butt to the floor, and let your butt muscles relax. °If this exercise is too easy, try doing it with your arms crossed over your chest. °Belly Crunches °Do these steps 5-10 times in a row: °1. Lie on your back on   a firm bed or the floor with your legs stretched out. °2. Bend your knees so they point up to the ceiling. Your feet should be flat on the floor. °3. Cross your arms over your chest. °4. Tip your chin a little bit toward your chest but do not bend your neck. °5. Tighten your belly muscles and slowly raise your chest just enough to lift your shoulder blades a tiny bit off of the floor. °6. Slowly lower your chest and your head to the floor. °Back Lifts °Do these steps 5-10 times in a row: °1. Lie on your belly (face-down) with your arms at your sides, and rest your forehead on the floor. °2. Tighten the muscles in your legs and your butt. °3. Slowly lift your chest off of the floor while you keep your hips on the floor. Keep the back of your head in line with the curve in your back. Look at the floor while you do this. °4. Stay in this position for 3-5 seconds. °5. Slowly lower your chest and your face to the floor. °GET HELP IF: °· Your back pain gets a lot worse when you do an exercise. °· Your back pain does not lessen 2 hours after you exercise. °If you have any of these problems, stop doing the exercises. Do not do them again unless your doctor says it is okay. °GET HELP RIGHT AWAY IF: °· You have sudden, very bad back pain. If this  happens, stop doing the exercises. Do not do them again unless your doctor says it is okay. °  °This information is not intended to replace advice given to you by your health care provider. Make sure you discuss any questions you have with your health care provider. °  °Document Released: 09/10/2010 Document Revised: 04/29/2015 Document Reviewed: 10/02/2014 °Elsevier Interactive Patient Education ©2016 Elsevier Inc. ° °

## 2016-02-13 NOTE — ED Notes (Signed)
Pt complaint of acute chronic lower back and leg pain related to hx of sciatica.

## 2016-04-02 ENCOUNTER — Emergency Department (HOSPITAL_COMMUNITY)
Admission: EM | Admit: 2016-04-02 | Discharge: 2016-04-02 | Disposition: A | Discharge status: Home / Self Care | Payer: Medicaid Other | Attending: Emergency Medicine | Admitting: Emergency Medicine

## 2016-04-02 ENCOUNTER — Encounter (HOSPITAL_COMMUNITY): Payer: Self-pay | Admitting: *Deleted

## 2016-04-02 ENCOUNTER — Emergency Department (HOSPITAL_COMMUNITY): Payer: Medicaid Other

## 2016-04-02 DIAGNOSIS — S0990XA Unspecified injury of head, initial encounter: Secondary | ICD-10-CM | POA: Insufficient documentation

## 2016-04-02 DIAGNOSIS — Z23 Encounter for immunization: Secondary | ICD-10-CM | POA: Insufficient documentation

## 2016-04-02 DIAGNOSIS — Z87891 Personal history of nicotine dependence: Secondary | ICD-10-CM | POA: Diagnosis not present

## 2016-04-02 DIAGNOSIS — Y929 Unspecified place or not applicable: Secondary | ICD-10-CM | POA: Diagnosis not present

## 2016-04-02 DIAGNOSIS — S00531A Contusion of lip, initial encounter: Secondary | ICD-10-CM | POA: Insufficient documentation

## 2016-04-02 DIAGNOSIS — S6391XA Sprain of unspecified part of right wrist and hand, initial encounter: Secondary | ICD-10-CM

## 2016-04-02 DIAGNOSIS — T148XXA Other injury of unspecified body region, initial encounter: Secondary | ICD-10-CM

## 2016-04-02 DIAGNOSIS — E119 Type 2 diabetes mellitus without complications: Secondary | ICD-10-CM | POA: Diagnosis not present

## 2016-04-02 DIAGNOSIS — Y9389 Activity, other specified: Secondary | ICD-10-CM | POA: Insufficient documentation

## 2016-04-02 DIAGNOSIS — Y990 Civilian activity done for income or pay: Secondary | ICD-10-CM | POA: Insufficient documentation

## 2016-04-02 DIAGNOSIS — S6991XA Unspecified injury of right wrist, hand and finger(s), initial encounter: Secondary | ICD-10-CM | POA: Diagnosis present

## 2016-04-02 MED ORDER — IBUPROFEN 600 MG PO TABS: 600.0000 mg | ORAL_TABLET | Freq: Four times a day (QID) | ORAL | 0 refills | Status: DC | PRN

## 2016-04-02 MED ORDER — HYDROCODONE-ACETAMINOPHEN 5-325 MG PO TABS
1.0000 | ORAL_TABLET | Freq: Once | ORAL | Status: AC
  Administered 2016-04-02: 1 via ORAL
  Filled 2016-04-02: qty 1

## 2016-04-02 MED ORDER — TETANUS-DIPHTH-ACELL PERTUSSIS 5-2.5-18.5 LF-MCG/0.5 IM SUSP
0.5000 mL | Freq: Once | INTRAMUSCULAR | Status: AC
  Administered 2016-04-02: 0.5 mL via INTRAMUSCULAR
  Filled 2016-04-02: qty 0.5

## 2016-04-02 MED ORDER — IBUPROFEN 800 MG PO TABS
800.0000 mg | ORAL_TABLET | Freq: Once | ORAL | Status: AC
  Administered 2016-04-02: 800 mg via ORAL
  Filled 2016-04-02: qty 1
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
## 2016-04-02 NOTE — Discharge Instructions (Signed)
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
## 2016-09-11 ENCOUNTER — Encounter (HOSPITAL_COMMUNITY): Payer: Self-pay | Admitting: Emergency Medicine

## 2016-09-11 ENCOUNTER — Emergency Department (HOSPITAL_COMMUNITY)
Admission: EM | Admit: 2016-09-11 | Discharge: 2016-09-11 | Disposition: A | Discharge status: Home / Self Care | Payer: Medicaid Other | Attending: Emergency Medicine | Admitting: Emergency Medicine

## 2016-09-11 ENCOUNTER — Emergency Department (HOSPITAL_COMMUNITY): Payer: Medicaid Other

## 2016-09-11 DIAGNOSIS — R51 Headache: Secondary | ICD-10-CM | POA: Insufficient documentation

## 2016-09-11 DIAGNOSIS — W000XXA Fall on same level due to ice and snow, initial encounter: Secondary | ICD-10-CM | POA: Diagnosis not present

## 2016-09-11 DIAGNOSIS — W19XXXA Unspecified fall, initial encounter: Secondary | ICD-10-CM

## 2016-09-11 DIAGNOSIS — Y92009 Unspecified place in unspecified non-institutional (private) residence as the place of occurrence of the external cause: Secondary | ICD-10-CM | POA: Insufficient documentation

## 2016-09-11 DIAGNOSIS — M25512 Pain in left shoulder: Secondary | ICD-10-CM | POA: Insufficient documentation

## 2016-09-11 DIAGNOSIS — M25562 Pain in left knee: Secondary | ICD-10-CM | POA: Insufficient documentation

## 2016-09-11 DIAGNOSIS — Z87891 Personal history of nicotine dependence: Secondary | ICD-10-CM | POA: Diagnosis not present

## 2016-09-11 DIAGNOSIS — Y939 Activity, unspecified: Secondary | ICD-10-CM | POA: Diagnosis not present

## 2016-09-11 DIAGNOSIS — Z79899 Other long term (current) drug therapy: Secondary | ICD-10-CM | POA: Insufficient documentation

## 2016-09-11 DIAGNOSIS — E119 Type 2 diabetes mellitus without complications: Secondary | ICD-10-CM | POA: Insufficient documentation

## 2016-09-11 DIAGNOSIS — M25551 Pain in right hip: Secondary | ICD-10-CM | POA: Diagnosis not present

## 2016-09-11 DIAGNOSIS — M5441 Lumbago with sciatica, right side: Secondary | ICD-10-CM | POA: Insufficient documentation

## 2016-09-11 DIAGNOSIS — M549 Dorsalgia, unspecified: Secondary | ICD-10-CM | POA: Diagnosis present

## 2016-09-11 DIAGNOSIS — Y999 Unspecified external cause status: Secondary | ICD-10-CM | POA: Diagnosis not present

## 2016-09-11 LAB — POC URINE PREG, ED: Preg Test, Ur: NEGATIVE

## 2016-09-11 MED ORDER — IBUPROFEN 800 MG PO TABS: 800.0000 mg | ORAL_TABLET | Freq: Three times a day (TID) | ORAL | 0 refills | Status: DC

## 2016-09-11 MED ORDER — METHOCARBAMOL 1000 MG/10ML IJ SOLN
1000.0000 mg | Freq: Once | INTRAMUSCULAR | Status: DC
  Filled 2016-09-11: qty 10

## 2016-09-11 MED ORDER — METHOCARBAMOL 500 MG PO TABS
1000.0000 mg | ORAL_TABLET | Freq: Once | ORAL | Status: AC
  Administered 2016-09-11: 1000 mg via ORAL
  Filled 2016-09-11: qty 2

## 2016-09-11 MED ORDER — KETOROLAC TROMETHAMINE 60 MG/2ML IM SOLN
30.0000 mg | Freq: Once | INTRAMUSCULAR | Status: AC
  Administered 2016-09-11: 30 mg via INTRAMUSCULAR
  Filled 2016-09-11: qty 2

## 2016-09-11 MED ORDER — CYCLOBENZAPRINE HCL 10 MG PO TABS: 10.0000 mg | ORAL_TABLET | Freq: Two times a day (BID) | ORAL | 0 refills | Status: DC | PRN

## 2016-09-11 NOTE — ED Provider Notes (Signed)
[facility] DEPT Provider Note   CSN: [id number] Arrival date & time: 09/11/16  1247   By signing my name below, I, Phyllis Dalton, attest that this documentation has been prepared under the direction and in the presence of Phyllis Mccoy, PA-C. Electronically Signed: Norris Dalton , ED Scribe. 09/05/16. 4:20 PM.   History   Chief Complaint Chief Complaint  Patient presents with  . Fall  . Knee Pain  . Hip Pain  . Back Pain    HPI  Phyllis Dalton is a 26 y.o. female with hx of sciatica who presents to the Emergency Department complaining of multiple musculoskeletal complaints with sudden onset x2 days ago s/p mechanical fall. Pt states that x2 days ago, she slipped on black ice while walking. She states that she landed on her L knee and arm. She suspects that she hit her head due to a constant headache since the fall. Pt reports photophobia, L knee pain, R sided back pain, L arm pain from elbow that radiates upward, L leg with shooting pain radiating down to the knee, numbness in hand, R hip pain. She also reports chest pain and vomiting x2 days ago that has since resolved. She reports chronic R thigh numbness. She has soaked in Epsom salt and has taken 2 Ibuprofen per day since the fall with no relief. She states that the pain is exacerbated. Pt denies LOC, R leg pain, bowel/bladder incontinence, saddle anesthesia, trouble breathing, abdominal pain, nausea, neck pain, back pain, urinary symptoms.   The history is provided by the patient. No language interpreter was used.    Past Medical History:  Diagnosis Date  . Depression   . Diabetes mellitus without complication ([place])   . Obesity   . Panic attack   . Trichomonal vaginitis     Patient Active Problem List   Diagnosis Date Noted  . Major depressive disorder, single episode, severe, without mention of psychotic behavior--hospitalized during pregnancy at [facility] 03/21/2013  . Panic attack     Past Surgical History:    Procedure Laterality Date  . addenoidectomy    . MOUTH SURGERY    . TONSILLECTOMY      OB History    Gravida Para Term Preterm AB Living   2 2 2  0 0 2   SAB TAB Ectopic Multiple Live Births   0 0 0 0 2       Home Medications    Prior to Admission medications   Medication Sig Start Date End Date Taking? Authorizing Provider  cyclobenzaprine (FLEXERIL) 10 MG tablet Take 1 tablet (10 mg total) by mouth 2 (two) times daily as needed for muscle spasms. 09/11/16   Phyllis Kuster, PA-C  etonogestrel (IMPLANON) 68 MG IMPL implant Inject 1 each into the skin once.    Historical Provider, MD  ibuprofen (ADVIL,MOTRIN) 800 MG tablet Take 1 tablet (800 mg total) by mouth 3 (three) times daily. 09/11/16   Phyllis Kuster, PA-C    Family History Family History  Problem Relation Age of Onset  . Diabetes Mother   . HIV Mother   . Diabetes Other     Social History Social History  Substance Use Topics  . Smoking status: Former Smoker    Types: Cigarettes    Quit date: 12/25/2012  . Smokeless tobacco: Never Used  . Alcohol use No     Comment: rare     Allergies   Patient has no known allergies.   Review of Systems Review of  Systems  Eyes: Positive for photophobia.  Respiratory: Negative for shortness of breath.   Gastrointestinal: Negative for abdominal pain and nausea.  Musculoskeletal: Positive for arthralgias (L knee, L arm, L leg, L shoulder, R hip) and back pain.  Neurological: Positive for headaches. Negative for syncope.     Physical Exam Updated Vital Signs BP 110/78   Pulse 81   Temp 98.2 F (36.8 C) (Oral)   Resp 18   SpO2 100%   Physical Exam  Constitutional: She appears well-developed and well-nourished. No distress.  HENT:  Head: Normocephalic and atraumatic.  Mouth/Throat: Oropharynx is clear and moist. No oropharyngeal exudate.  Eyes: Conjunctivae are normal. Pupils are equal, round, and reactive to light. Right eye exhibits no discharge. Left eye  exhibits no discharge. No scleral icterus.  Neck: Normal range of motion. Neck supple. No thyromegaly present.  Cardiovascular: Normal rate, regular rhythm, normal heart sounds and intact distal pulses.  Exam reveals no gallop and no friction rub.   No murmur heard. Pulmonary/Chest: Effort normal and breath sounds normal. No stridor. No respiratory distress. She has no wheezes. She has no rales.  Abdominal: Soft. Bowel sounds are normal. She exhibits no distension. There is no tenderness. There is no rebound and no guarding.  Musculoskeletal: She exhibits no edema.       Right hip: Normal. She exhibits no tenderness and no bony tenderness.       Left hip: She exhibits no tenderness and no bony tenderness.  No CT tenderness Midline and R sided lumbar tenderness L knee: lateral joint line tenderness, pain with varus stress, negative anterior/posterior drawer test, normal sensation, DP pulse intact, pain with full ROM, but full ROM intact L shoulder: bony tenderness to joint and humerus no elbow tenderness   Lymphadenopathy:    She has no cervical adenopathy.  Neurological: She is alert. Coordination normal.  CN 3-12 intact; normal sensation throughout, except for chronic decreased sensation to R anterior thigh; 5/5 strength in all 4 extremities; equal bilateral grip strength; no ataxia on finger to nose  Skin: Skin is warm and dry. No rash noted. She is not diaphoretic. No pallor.  Psychiatric: She has a normal mood and affect.  Nursing note and vitals reviewed.    ED Treatments / Results   DIAGNOSTIC STUDIES: Oxygen Saturation is 99% on RA, normal by my interpretation.   COORDINATION OF CARE: 7:39 PM-Discussed next steps with pt. Pt verbalized understanding and is agreeable with the plan.    Labs (all labs ordered are listed, but only abnormal results are displayed) Labs Reviewed  POC URINE PREG, ED    EKG  EKG Interpretation None       Radiology Dg Lumbar Spine  Complete  Result Date: 09/11/2016 CLINICAL DATA:  Fall on ice 2 days ago. Low back pain. Initial encounter. EXAM: LUMBAR SPINE - COMPLETE 4+ VIEW COMPARISON:  None. FINDINGS: There is no evidence of lumbar spine fracture. Alignment is normal. Intervertebral disc spaces are maintained. No evidence of facet arthropathy or other bone lesions. Transitional lumbosacral vertebra noted at L5. IMPRESSION: Negative. Electronically Signed   By: Earle Gell M.D.   On: 09/11/2016 15:18   Ct Head Wo Contrast  Result Date: 09/11/2016 CLINICAL DATA:  [place] 2 days ago striking the left side of the head. EXAM: CT HEAD WITHOUT CONTRAST TECHNIQUE: Contiguous axial images were obtained from the base of the skull through the vertex without intravenous contrast. COMPARISON:  09/06/2008 FINDINGS: Brain: No evidence of malformation, atrophy,  old or acute small or large vessel infarction, mass lesion, hemorrhage, hydrocephalus or extra-axial collection. No evidence of pituitary lesion. Vascular: No vascular calcification.  No hyperdense vessels. Skull: Normal.  No fracture or focal bone lesion. Sinuses/Orbits: Visualized sinuses are clear. No fluid in the middle ears or mastoids. Visualized orbits are normal. Other: None significant IMPRESSION: Normal head CT Electronically Signed   By: Nelson Chimes M.D.   On: 09/11/2016 15:06   Dg Shoulder Left  Result Date: 09/11/2016 CLINICAL DATA:  [place] on ice 2 days ago. Left shoulder injury and pain. Initial encounter. EXAM: LEFT SHOULDER - 2+ VIEW COMPARISON:  None. FINDINGS: There is no evidence of fracture or dislocation. There is no evidence of arthropathy or other focal bone abnormality. Soft tissues are unremarkable. IMPRESSION: Negative. Electronically Signed   By: Earle Gell M.D.   On: 09/11/2016 15:19   Dg Knee Complete 4 Views Left  Result Date: 09/11/2016 CLINICAL DATA:  Fall after slipping on ice, left knee pain EXAM: LEFT KNEE - COMPLETE 4+ VIEW COMPARISON:  None. FINDINGS:  Four views of the left knee submitted. No acute fracture or subluxation. No radiopaque foreign body. Tiny joint effusion. Minimal narrowing of medial joint compartment. IMPRESSION: No acute fracture or subluxation. Minimal narrowing of medial joint compartment. Tiny joint effusion. Electronically Signed   By: Lahoma Crocker M.D.   On: 09/11/2016 15:20   Dg Humerus Left  Result Date: 09/11/2016 CLINICAL DATA:  Slipped on ice 2 days ago. Left upper arm injury and pain. Initial encounter. EXAM: LEFT HUMERUS - 2+ VIEW COMPARISON:  None. FINDINGS: There is no evidence of fracture or other focal bone lesions. Soft tissues are unremarkable. IMPRESSION: Negative. Electronically Signed   By: Earle Gell M.D.   On: 09/11/2016 15:19    Procedures Procedures (including critical care time)  Medications Ordered in ED Medications  ketorolac (TORADOL) injection 30 mg (30 mg Intramuscular Given 09/11/16 1604)  methocarbamol (ROBAXIN) tablet 1,000 mg (1,000 mg Oral Given 09/11/16 1622)     Initial Impression / Assessment and Plan / ED Course  I have reviewed the triage vital signs and the nursing notes.  Pertinent labs & imaging results that were available during my care of the patient were reviewed by me and considered in my medical decision making (see chart for details).     Patient without signs of serious head, neck, or back injury following fall. Normal neurological exam. No concern for closed head injury, lung injury, or intraabdominal injury. Normal muscle soreness. Left knee x-ray shows tiny joint effusion only. Lumbar, L shoulder, humerus xrays and CT head negative. Due to pts normal radiology & ability to ambulate in ED pt will be dc home with symptomatic therapy. Pt has been instructed to follow up with their doctor if symptoms persist. Home conservative therapies for pain including ice and heat tx have been discussed. Discharge home with Flexeril, ibuprofen. Pt is hemodynamically stable, in NAD, & able  to ambulate in the ED. Return precautions discussed.   Final Clinical Impressions(s) / ED Diagnoses   Final diagnoses:  Fall, initial encounter  Acute right-sided low back pain with right-sided sciatica  Acute pain of left shoulder  Acute pain of left knee    New Prescriptions Discharge Medication List as of 09/11/2016  3:36 PM    START taking these medications   Details  cyclobenzaprine (FLEXERIL) 10 MG tablet Take 1 tablet (10 mg total) by mouth 2 (two) times daily as needed for muscle spasms.,  Starting Sun 09/11/2016, Print       I personally performed the services described in this documentation, which was scribed in my presence. The recorded information has been reviewed and is accurate.     Phyllis Kuster, PA-C 09/11/16 [place], MD 09/12/16 1017

## 2016-09-11 NOTE — ED Triage Notes (Signed)
Patient here from home with complaints of fall on the 19th after slipping on ice. Pain to left knee, hip, arm, and back. Pain 10/10. Ice and heat with no relief.

## 2016-09-11 NOTE — Discharge Instructions (Signed)
Medications: Ibuprofen, Flexeril  Treatment: Take ibuprofen every 8 hours as prescribed. You can alternate with Tylenol as prescribed over-the-counter every 4-6 hours. Take Flexeril twice daily as needed for muscle pain and spasms. Do not drive or operate machinery when taking this medication. Use heat on your back and sore muscles 3-4 times daily alternating 20 minutes on, 20 minutes off. Use ice on your knee alternating 20 minutes, 20 minutes off. Keep elevated whenever you're not walking on it. Wear knee sleeve at all times for support and to help with swelling.  Follow-up: Please follow up with your primary care provider or the orthopedic doctor outlined below if your symptoms are not improving over the next week. Please return to the emergency department if you develop any new or worsening symptoms.

## 2017-01-12 ENCOUNTER — Emergency Department (HOSPITAL_COMMUNITY): Payer: Medicaid Other

## 2017-01-12 ENCOUNTER — Encounter (HOSPITAL_COMMUNITY): Payer: Self-pay

## 2017-01-12 ENCOUNTER — Emergency Department (HOSPITAL_COMMUNITY)
Admission: EM | Admit: 2017-01-12 | Discharge: 2017-01-12 | Disposition: A | Discharge status: Home / Self Care | Payer: Medicaid Other | Attending: Emergency Medicine | Admitting: Emergency Medicine

## 2017-01-12 DIAGNOSIS — Z79899 Other long term (current) drug therapy: Secondary | ICD-10-CM | POA: Diagnosis not present

## 2017-01-12 DIAGNOSIS — E119 Type 2 diabetes mellitus without complications: Secondary | ICD-10-CM | POA: Diagnosis not present

## 2017-01-12 DIAGNOSIS — R1084 Generalized abdominal pain: Secondary | ICD-10-CM | POA: Insufficient documentation

## 2017-01-12 DIAGNOSIS — R072 Precordial pain: Secondary | ICD-10-CM | POA: Insufficient documentation

## 2017-01-12 DIAGNOSIS — Z87891 Personal history of nicotine dependence: Secondary | ICD-10-CM | POA: Insufficient documentation

## 2017-01-12 DIAGNOSIS — R109 Unspecified abdominal pain: Secondary | ICD-10-CM

## 2017-01-12 LAB — COMPREHENSIVE METABOLIC PANEL
ALT: 17 U/L (ref 14–54)
AST: 19 U/L (ref 15–41)
Albumin: 3.8 g/dL (ref 3.5–5.0)
Alkaline Phosphatase: 74 U/L (ref 38–126)
Anion gap: 6 (ref 5–15)
BUN: 7 mg/dL (ref 6–20)
CO2: 29 mmol/L (ref 22–32)
Calcium: 9.2 mg/dL (ref 8.9–10.3)
Chloride: 105 mmol/L (ref 101–111)
Creatinine, Ser: 0.8 mg/dL (ref 0.44–1.00)
GFR calc Af Amer: >60 mL/min (ref >60)
GFR calc non Af Amer: >60 mL/min (ref >60)
Glucose, Bld: 96 mg/dL (ref 65–99)
Potassium: 4 mmol/L (ref 3.5–5.1)
Sodium: 140 mmol/L (ref 135–145)
Total Bilirubin: 0.6 mg/dL (ref 0.3–1.2)
Total Protein: 7.3 g/dL (ref 6.5–8.1)

## 2017-01-12 LAB — I-STAT TROPONIN, ED: Troponin i, poc: 0 ng/mL (ref 0.00–0.08)

## 2017-01-12 LAB — CBC
HCT: 38.7 % (ref 36.0–46.0)
Hemoglobin: 12.5 g/dL (ref 12.0–15.0)
MCH: 26.2 pg (ref 26.0–34.0)
MCHC: 32.3 g/dL (ref 30.0–36.0)
MCV: 81 fL (ref 78.0–100.0)
Platelets: 445 10*3/uL — ABNORMAL HIGH (ref 150–400)
RBC: 4.78 MIL/uL (ref 3.87–5.11)
RDW: 14.1 % (ref 11.5–15.5)
WBC: 7.4 10*3/uL (ref 4.0–10.5)

## 2017-01-12 LAB — I-STAT BETA HCG BLOOD, ED (MC, WL, AP ONLY): I-stat hCG, quantitative: <5 m[IU]/mL (ref <5)

## 2017-01-12 LAB — LIPASE, BLOOD: Lipase: 17 U/L (ref 11–51)

## 2017-01-12 MED ORDER — DICYCLOMINE HCL 10 MG PO CAPS
10.0000 mg | ORAL_CAPSULE | Freq: Once | ORAL | Status: AC
  Administered 2017-01-12: 10 mg via ORAL
  Filled 2017-01-12: qty 1

## 2017-01-12 MED ORDER — DICYCLOMINE HCL 20 MG PO TABS: 20.0000 mg | ORAL_TABLET | Freq: Two times a day (BID) | ORAL | 0 refills | Status: DC

## 2017-01-12 NOTE — ED Provider Notes (Signed)
[facility] DEPT Provider Note   CSN: [id number] Arrival date & time: 01/12/17  6629     History   Chief Complaint Chief Complaint  Patient presents with  . Abdominal Pain  . Chest Pain    HPI Phyllis Dalton is a 26 y.o. female.  HPI  Patient presents emergency department with complaints of generalized abdominal pain over the past week without nausea vomiting or diarrhea.  She feels like she is having some cramping in her abdomen.  She also feels that it may have been some red blood in her stool.  Symptoms are mild to moderate in severity.  She also is reported rather constant chest pain located in her central chest over the past week without associated shortness of breath.  No fevers or chills.  Past Medical History:  Diagnosis Date  . Depression   . Diabetes mellitus without complication ([place])   . Obesity   . Panic attack   . Trichomonal vaginitis     Patient Active Problem List   Diagnosis Date Noted  . Major depressive disorder, single episode, severe, without mention of psychotic behavior--hospitalized during pregnancy at [facility] 03/21/2013  . Panic attack     Past Surgical History:  Procedure Laterality Date  . addenoidectomy    . MOUTH SURGERY    . TONSILLECTOMY      OB History    Gravida Para Term Preterm AB Living   2 2 2  0 0 2   SAB TAB Ectopic Multiple Live Births   0 0 0 0 2       Home Medications    Prior to Admission medications   Medication Sig Start Date End Date Taking? Authorizing Provider  cyclobenzaprine (FLEXERIL) 10 MG tablet Take 1 tablet (10 mg total) by mouth 2 (two) times daily as needed for muscle spasms. Patient not taking: Reported on 01/12/2017 09/11/16   Frederica Kuster, PA-C  ibuprofen (ADVIL,MOTRIN) 800 MG tablet Take 1 tablet (800 mg total) by mouth 3 (three) times daily. Patient not taking: Reported on 01/12/2017 09/11/16   Frederica Kuster, PA-C    Family History Family History  Problem Relation Age of Onset  .  Diabetes Mother   . HIV Mother   . Diabetes Other     Social History Social History  Substance Use Topics  . Smoking status: Former Smoker    Types: Cigarettes    Quit date: 12/25/2012  . Smokeless tobacco: Never Used  . Alcohol use No     Comment: rare     Allergies   Patient has no known allergies.   Review of Systems Review of Systems  All other systems reviewed and are negative.    Physical Exam Updated Vital Signs BP 106/73 (BP Location: Left Arm)   Pulse 78   Temp 98 F (36.7 C) (Oral)   Resp 18   SpO2 95%   Physical Exam  Constitutional: She is oriented to person, place, and time. She appears well-developed and well-nourished. No distress.  HENT:  Head: Normocephalic and atraumatic.  Eyes: EOM are normal.  Neck: Normal range of motion.  Cardiovascular: Normal rate, regular rhythm and normal heart sounds.   Pulmonary/Chest: Effort normal and breath sounds normal.  Abdominal: Soft. She exhibits no distension. There is no tenderness.  Musculoskeletal: Normal range of motion.  Neurological: She is alert and oriented to person, place, and time.  Skin: Skin is warm and dry.  Psychiatric: She has a normal mood and affect. Judgment normal.  Nursing note and vitals reviewed.    ED Treatments / Results  Labs (all labs ordered are listed, but only abnormal results are displayed) Labs Reviewed  CBC - Abnormal; Notable for the following:       Result Value   Platelets 445 (*)    All other components within normal limits  COMPREHENSIVE METABOLIC PANEL  LIPASE, BLOOD  I-STAT TROPOININ, ED  I-STAT BETA HCG BLOOD, ED ([facility], [facility], [facility] ONLY)    EKG  EKG Interpretation  Date/Time:  Thursday Jan 12 2017 04:48:51 EDT Ventricular Rate:  77 PR Interval:    QRS Duration: 92 QT Interval:  385 QTC Calculation: 436 R Axis:   45 Text Interpretation:  Sinus rhythm Benign early repolarization No significant change was found Confirmed by CAMPOS  MD, KEVIN ([id number]) on  01/12/2017 6:48:47 AM       Radiology Dg Abdomen Acute W/chest  Result Date: 01/12/2017 CLINICAL DATA:  26 year old female with a history of left upper abdominal can chest pain EXAM: DG ABDOMEN ACUTE W/ 1V CHEST COMPARISON:  CT 02/07/2015, chest x-ray 04/26/2014, 11/14/2013 FINDINGS: Chest: Cardiomediastinal silhouette within normal limits. No confluent airspace disease or pneumothorax.  No pleural effusion. Abdomen: Gas within stomach and small bowel with no abnormal distention. Small volume of colonic gas without distention. Gas extends to the rectum. No unexpected radiopaque foreign body. No unexpected calcification or soft tissue density. No displaced fracture.  Partial sacralization of L5. IMPRESSION: Chest: No radiographic evidence of acute cardiopulmonary disease. Abdomen: Normal bowel gas pattern. Electronically Signed   By: Corrie Mckusick D.O.   On: 01/12/2017 07:50    Procedures Procedures (including critical care time)  Medications Ordered in ED Medications  dicyclomine (BENTYL) capsule 10 mg (10 mg Oral Given 01/12/17 0754)     Initial Impression / Assessment and Plan / ED Course  I have reviewed the triage vital signs and the nursing notes.  Pertinent labs & imaging results that were available during my care of the patient were reviewed by me and considered in my medical decision making (see chart for details).    Patient is overall well-appearing.  Nonspecific chest and abdominal pain.  Workup in the ER without abnormality.  Discharge home in good condition.  Primary care follow-up       Final Clinical Impressions(s) / ED Diagnoses   Final diagnoses:  Abdominal pain, unspecified abdominal location  Precordial pain    New Prescriptions New Prescriptions   DICYCLOMINE (BENTYL) 20 MG TABLET    Take 1 tablet (20 mg total) by mouth 2 (two) times daily.     Jola Schmidt, MD 01/12/17 0800

## 2017-01-12 NOTE — ED Notes (Signed)
Patient transported to X-ray 

## 2017-01-12 NOTE — ED Notes (Signed)
Upon exam, pt has LUQ pain upon palpation. States the pain goes away in a short time. States the bloody stools began Tuesday. Pt complaining of cramping in the lower abdomen. Pt states it feels like "her insides are moving."

## 2017-01-12 NOTE — ED Notes (Signed)
EKG given to EDP,Campos,MD., for review. 

## 2017-01-12 NOTE — ED Triage Notes (Signed)
Pt complains of centralized chest pain that radiates to her abdomen for one week, pain was worse this am, pt denies vomiting or diarrhea but states her stools are bright red

## 2017-01-26 ENCOUNTER — Encounter (HOSPITAL_COMMUNITY): Payer: Self-pay | Admitting: Emergency Medicine

## 2017-01-26 ENCOUNTER — Emergency Department (HOSPITAL_COMMUNITY): Payer: Medicaid Other

## 2017-01-26 ENCOUNTER — Emergency Department (HOSPITAL_COMMUNITY)
Admission: EM | Admit: 2017-01-26 | Discharge: 2017-01-26 | Disposition: A | Discharge status: Home / Self Care | Payer: Medicaid Other | Attending: Emergency Medicine | Admitting: Emergency Medicine

## 2017-01-26 DIAGNOSIS — E119 Type 2 diabetes mellitus without complications: Secondary | ICD-10-CM | POA: Diagnosis not present

## 2017-01-26 DIAGNOSIS — Z79899 Other long term (current) drug therapy: Secondary | ICD-10-CM | POA: Diagnosis not present

## 2017-01-26 DIAGNOSIS — R1010 Upper abdominal pain, unspecified: Secondary | ICD-10-CM | POA: Insufficient documentation

## 2017-01-26 DIAGNOSIS — R52 Pain, unspecified: Secondary | ICD-10-CM

## 2017-01-26 DIAGNOSIS — Z87891 Personal history of nicotine dependence: Secondary | ICD-10-CM | POA: Insufficient documentation

## 2017-01-26 LAB — COMPREHENSIVE METABOLIC PANEL
ALT: 14 U/L (ref 14–54)
AST: 14 U/L — ABNORMAL LOW (ref 15–41)
Albumin: 3.9 g/dL (ref 3.5–5.0)
Alkaline Phosphatase: 80 U/L (ref 38–126)
Anion gap: 6 (ref 5–15)
BUN: 7 mg/dL (ref 6–20)
CO2: 29 mmol/L (ref 22–32)
Calcium: 9.1 mg/dL (ref 8.9–10.3)
Chloride: 104 mmol/L (ref 101–111)
Creatinine, Ser: 0.71 mg/dL (ref 0.44–1.00)
GFR calc Af Amer: >60 mL/min (ref >60)
GFR calc non Af Amer: >60 mL/min (ref >60)
Glucose, Bld: 86 mg/dL (ref 65–99)
Potassium: 3.9 mmol/L (ref 3.5–5.1)
Sodium: 139 mmol/L (ref 135–145)
Total Bilirubin: 0.9 mg/dL (ref 0.3–1.2)
Total Protein: 7.2 g/dL (ref 6.5–8.1)

## 2017-01-26 LAB — URINALYSIS, ROUTINE W REFLEX MICROSCOPIC
Bilirubin Urine: NEGATIVE
Glucose, UA: NEGATIVE mg/dL
Hgb urine dipstick: NEGATIVE
Ketones, ur: NEGATIVE mg/dL
Nitrite: NEGATIVE
Protein, ur: NEGATIVE mg/dL
Specific Gravity, Urine: 1.017 (ref 1.005–1.030)
pH: 7 (ref 5.0–8.0)

## 2017-01-26 LAB — CBC
HCT: 39.1 % (ref 36.0–46.0)
Hemoglobin: 12.6 g/dL (ref 12.0–15.0)
MCH: 26.1 pg (ref 26.0–34.0)
MCHC: 32.2 g/dL (ref 30.0–36.0)
MCV: 81.1 fL (ref 78.0–100.0)
Platelets: 451 10*3/uL — ABNORMAL HIGH (ref 150–400)
RBC: 4.82 MIL/uL (ref 3.87–5.11)
RDW: 14 % (ref 11.5–15.5)
WBC: 9.9 10*3/uL (ref 4.0–10.5)

## 2017-01-26 LAB — LIPASE, BLOOD: Lipase: 17 U/L (ref 11–51)

## 2017-01-26 LAB — POC URINE PREG, ED: Preg Test, Ur: NEGATIVE

## 2017-01-26 MED ORDER — GI COCKTAIL ~~LOC~~
30.0000 mL | Freq: Once | ORAL | Status: AC
  Administered 2017-01-26: 30 mL via ORAL
  Filled 2017-01-26: qty 30

## 2017-01-26 NOTE — Discharge Instructions (Signed)
Take ranitidine (Zantac) as directed as well as Maalox or Mylanta 2 tablespoons after meals and at bedtime. Avoid greasy fried or fatty foods. Eat lots of fruits and vegetables. Baked chicken or baked fish is best to eat. Ask Dr. Criss Rosales to help you with a good diet. If you continue to have significant abdominal discomfort you may need to ask her for referral to a gastroenterologist

## 2017-01-26 NOTE — ED Notes (Signed)
US at bedside

## 2017-01-26 NOTE — ED Provider Notes (Addendum)
[facility] DEPT Provider Note   CSN: [id number] Arrival date & time: 01/26/17  0909     History   Chief Complaint Chief Complaint  Patient presents with  . Abdominal Pain  . Constipation    HPI Phyllis Dalton is a 26 y.o. female.   HPI Complains of upper abdominal pain for the past 3 days. Worse with eating or with walking. Improved with remaining still. She also reports that she feels somewhat constipated though she had a bowel movement in the emergency Department immediately prior to my seeing her, with improvement of pain. She denies fever denies nausea or vomiting. Presently hungry. Last normal menstrual period end of May 2018. No urinary symptoms. No treatment prior to coming here. Patient was seen here for similar complaint 01/12/2017 however she states that symptoms transiently improved. Other associated symptoms include transient nausea 3 days ago which has since resolved. No nausea present Past Medical History:  Diagnosis Date  . Depression   . Diabetes mellitus without complication ([place])   . Obesity   . Panic attack   . Trichomonal vaginitis     Patient Active Problem List   Diagnosis Date Noted  . Major depressive disorder, single episode, severe, without mention of psychotic behavior--hospitalized during pregnancy at [facility] 03/21/2013  . Panic attack     Past Surgical History:  Procedure Laterality Date  . addenoidectomy    . MOUTH SURGERY    . TONSILLECTOMY      OB History    Gravida Para Term Preterm AB Living   2 2 2  0 0 2   SAB TAB Ectopic Multiple Live Births   0 0 0 0 2       Home Medications    Prior to Admission medications   Medication Sig Start Date End Date Taking? Authorizing Provider  dicyclomine (BENTYL) 20 MG tablet Take 1 tablet (20 mg total) by mouth 2 (two) times daily. 01/12/17  Yes Jola Schmidt, MD  cyclobenzaprine (FLEXERIL) 10 MG tablet Take 1 tablet (10 mg total) by mouth 2 (two) times daily as needed for muscle  spasms. Patient not taking: Reported on 01/12/2017 09/11/16   Frederica Kuster, PA-C  ibuprofen (ADVIL,MOTRIN) 800 MG tablet Take 1 tablet (800 mg total) by mouth 3 (three) times daily. Patient not taking: Reported on 01/12/2017 09/11/16   Frederica Kuster, PA-C    Family History Family History  Problem Relation Age of Onset  . Diabetes Mother   . HIV Mother   . Diabetes Other     Social History Social History  Substance Use Topics  . Smoking status: Former Smoker    Types: Cigarettes    Quit date: 12/25/2012  . Smokeless tobacco: Never Used  . Alcohol use No     Comment: rare     Allergies   Patient has no known allergies.   Review of Systems Review of Systems  Constitutional: Negative.   HENT: Negative.   Respiratory: Negative.   Cardiovascular: Negative.   Gastrointestinal: Positive for abdominal pain, constipation and nausea.  Musculoskeletal: Negative.   Skin: Negative.   Neurological: Negative.   Psychiatric/Behavioral: Negative.   All other systems reviewed and are negative.    Physical Exam Updated Vital Signs BP 110/66   Pulse 69   Temp 97.7 F (36.5 C) (Oral)   Resp 16   Ht 5\' 6"  (1.676 m)   Wt 90.7 kg (200 lb)   SpO2 99%   BMI 32.28 kg/m   Physical Exam  Constitutional: She appears well-developed and well-nourished.  HENT:  Head: Normocephalic and atraumatic.  Eyes: Conjunctivae are normal. Pupils are equal, round, and reactive to light.  Neck: Neck supple. No tracheal deviation present. No thyromegaly present.  Cardiovascular: Normal rate and regular rhythm.   No murmur heard. Pulmonary/Chest: Effort normal and breath sounds normal.  Abdominal: Soft. Bowel sounds are normal. She exhibits no distension. There is tenderness.  Morbidly obese, right upper quadrant tenderness  Musculoskeletal: Normal range of motion. She exhibits no edema or tenderness.  Neurological: She is alert. Coordination normal.  Skin: Skin is warm and dry. No rash noted.   Psychiatric: She has a normal mood and affect.  Nursing note and vitals reviewed.    ED Treatments / Results  Labs (all labs ordered are listed, but only abnormal results are displayed) Labs Reviewed  COMPREHENSIVE METABOLIC PANEL - Abnormal; Notable for the following:       Result Value   AST 14 (*)    All other components within normal limits  CBC - Abnormal; Notable for the following:    Platelets 451 (*)    All other components within normal limits  LIPASE, BLOOD  URINALYSIS, ROUTINE W REFLEX MICROSCOPIC    EKG  EKG Interpretation None      Results for orders placed or performed during the hospital encounter of 01/26/17  Lipase, blood  Result Value Ref Range   Lipase 17 11 - 51 U/L  Comprehensive metabolic panel  Result Value Ref Range   Sodium 139 135 - 145 mmol/L   Potassium 3.9 3.5 - 5.1 mmol/L   Chloride 104 101 - 111 mmol/L   CO2 29 22 - 32 mmol/L   Glucose, Bld 86 65 - 99 mg/dL   BUN 7 6 - 20 mg/dL   Creatinine, Ser 0.71 0.44 - 1.00 mg/dL   Calcium 9.1 8.9 - 10.3 mg/dL   Total Protein 7.2 6.5 - 8.1 g/dL   Albumin 3.9 3.5 - 5.0 g/dL   AST 14 (L) 15 - 41 U/L   ALT 14 14 - 54 U/L   Alkaline Phosphatase 80 38 - 126 U/L   Total Bilirubin 0.9 0.3 - 1.2 mg/dL   GFR calc non Af Amer >60 >60 mL/min   GFR calc Af Amer >60 >60 mL/min   Anion gap 6 5 - 15  CBC  Result Value Ref Range   WBC 9.9 4.0 - 10.5 K/uL   RBC 4.82 3.87 - 5.11 MIL/uL   Hemoglobin 12.6 12.0 - 15.0 g/dL   HCT 39.1 36.0 - 46.0 %   MCV 81.1 78.0 - 100.0 fL   MCH 26.1 26.0 - 34.0 pg   MCHC 32.2 30.0 - 36.0 g/dL   RDW 14.0 11.5 - 15.5 %   Platelets 451 (H) 150 - 400 K/uL  Urinalysis, Routine w reflex microscopic  Result Value Ref Range   Color, Urine YELLOW YELLOW   APPearance CLOUDY (A) CLEAR   Specific Gravity, Urine 1.017 1.005 - 1.030   pH 7.0 5.0 - 8.0   Glucose, UA NEGATIVE NEGATIVE mg/dL   Hgb urine dipstick NEGATIVE NEGATIVE   Bilirubin Urine NEGATIVE NEGATIVE   Ketones, ur  NEGATIVE NEGATIVE mg/dL   Protein, ur NEGATIVE NEGATIVE mg/dL   Nitrite NEGATIVE NEGATIVE   Leukocytes, UA MODERATE (A) NEGATIVE   RBC / HPF 0-5 0 - 5 RBC/hpf   WBC, UA 6-30 0 - 5 WBC/hpf   Bacteria, UA MANY (A) NONE SEEN   Squamous Epithelial /  LPF 6-30 (A) NONE SEEN   Mucous PRESENT   POC urine preg, ED (not at [facility])  Result Value Ref Range   Preg Test, Ur NEGATIVE NEGATIVE   US Abdomen Limited  Result Date: 01/26/2017 CLINICAL DATA:  Abdominal pain and chest pain.  Vomiting. EXAM: ULTRASOUND ABDOMEN LIMITED RIGHT UPPER QUADRANT COMPARISON:  None. FINDINGS: Gallbladder: No gallstones or wall thickening visualized. No sonographic Murphy sign noted by sonographer. Common bile duct: Diameter: Normal at 4 mm Liver: No focal lesion identified. Within normal limits in parenchymal echogenicity. IMPRESSION: Normal RIGHT upper quadrant ultrasound. Electronically Signed   By: Suzy Bouchard M.D.   On: 01/26/2017 12:36   Dg Abdomen Acute W/chest  Result Date: 01/12/2017 CLINICAL DATA:  26 year old female with a history of left upper abdominal can chest pain EXAM: DG ABDOMEN ACUTE W/ 1V CHEST COMPARISON:  CT 02/07/2015, chest x-ray 04/26/2014, 11/14/2013 FINDINGS: Chest: Cardiomediastinal silhouette within normal limits. No confluent airspace disease or pneumothorax.  No pleural effusion. Abdomen: Gas within stomach and small bowel with no abnormal distention. Small volume of colonic gas without distention. Gas extends to the rectum. No unexpected radiopaque foreign body. No unexpected calcification or soft tissue density. No displaced fracture.  Partial sacralization of L5. IMPRESSION: Chest: No radiographic evidence of acute cardiopulmonary disease. Abdomen: Normal bowel gas pattern. Electronically Signed   By: Corrie Mckusick D.O.   On: 01/12/2017 07:50    Radiology No results found.  Procedures Procedures (including critical care time)  Medications Ordered in ED Medications - No data to  display  1:40 PM feels improved after treatment with GI cocktail. Initial Impression / Assessment and Plan / ED Course  I have reviewed the triage vital signs and the nursing notes.  Pertinent labs & imaging results that were available during my care of the patient were reviewed by me and considered in my medical decision making (see chart for details).   I don't feel that your needs to be cultured or Treated for UTI as patient has urinary symptoms  Suggest ranitidine, Maalox. Follow-up with Dr. Criss Rosales to discuss diet. May need GI referral if symptoms recur. Avoid fatty and greasy foods.  Final Clinical Impressions(s) / ED Diagnoses  Diagnosis upper abdominal pain Final diagnoses:  None    New Prescriptions New Prescriptions   No medications on file     Orlie Dakin, MD 01/26/17 1356 Addendum upon discharge the patient showed me a tiny burn approximately 3 cm x 4 cm on her left flank that occurred several days ago while at work. Burn appears well healing. I offered her tetanus immunization that she did not get up before she left. We will call patient back to offer tetanus immunization or she can follow-up with her PMD   Orlie Dakin, MD 01/26/17 1407

## 2017-01-26 NOTE — ED Notes (Addendum)
After discharge, Dr. Winfred Leeds requesting patient be called to return to ED or follow-up with Dr. Criss Rosales for tetanus shot. Attempted to call patient and make her aware, no answer.

## 2017-01-26 NOTE — ED Triage Notes (Addendum)
Patient here from home with complaints of upper abdominal pain that started Monday and reports constipation. Last bowel movement Sunday. Nausea, no vomiting. Tums with no relief.

## 2017-01-26 NOTE — ED Notes (Signed)
ED Provider at bedside. 

## 2017-04-25 ENCOUNTER — Encounter (HOSPITAL_BASED_OUTPATIENT_CLINIC_OR_DEPARTMENT_OTHER): Payer: Self-pay | Admitting: Emergency Medicine

## 2017-04-25 ENCOUNTER — Emergency Department (HOSPITAL_BASED_OUTPATIENT_CLINIC_OR_DEPARTMENT_OTHER)
Admission: EM | Admit: 2017-04-25 | Discharge: 2017-04-25 | Disposition: A | Discharge status: Home / Self Care | Payer: Medicaid Other | Attending: Emergency Medicine | Admitting: Emergency Medicine

## 2017-04-25 DIAGNOSIS — Z5321 Procedure and treatment not carried out due to patient leaving prior to being seen by health care provider: Secondary | ICD-10-CM | POA: Diagnosis not present

## 2017-04-25 DIAGNOSIS — M25512 Pain in left shoulder: Secondary | ICD-10-CM | POA: Insufficient documentation

## 2017-04-25 DIAGNOSIS — M545 Low back pain: Secondary | ICD-10-CM | POA: Diagnosis not present

## 2017-04-25 NOTE — ED Triage Notes (Signed)
Pt fell on Sunday over her dog.  Pt fell on cement.  Pt c/o pain to her right lower back, and left shoulder.  No head injury

## 2017-04-27 ENCOUNTER — Emergency Department (HOSPITAL_BASED_OUTPATIENT_CLINIC_OR_DEPARTMENT_OTHER)
Admission: EM | Admit: 2017-04-27 | Discharge: 2017-04-27 | Disposition: A | Discharge status: Home / Self Care | Payer: Medicaid Other | Attending: Emergency Medicine | Admitting: Emergency Medicine

## 2017-04-27 DIAGNOSIS — W19XXXA Unspecified fall, initial encounter: Secondary | ICD-10-CM

## 2017-04-27 DIAGNOSIS — M25511 Pain in right shoulder: Secondary | ICD-10-CM

## 2017-04-27 DIAGNOSIS — E119 Type 2 diabetes mellitus without complications: Secondary | ICD-10-CM | POA: Diagnosis not present

## 2017-04-27 DIAGNOSIS — Z87891 Personal history of nicotine dependence: Secondary | ICD-10-CM | POA: Diagnosis not present

## 2017-04-27 DIAGNOSIS — M545 Low back pain: Secondary | ICD-10-CM | POA: Diagnosis present

## 2017-04-27 DIAGNOSIS — M5441 Lumbago with sciatica, right side: Secondary | ICD-10-CM | POA: Insufficient documentation

## 2017-04-27 MED ORDER — PREDNISONE 10 MG PO TABS: 40.0000 mg | ORAL_TABLET | Freq: Every day | ORAL | 0 refills | Status: AC

## 2017-04-27 MED ORDER — PREDNISONE 50 MG PO TABS
60.0000 mg | ORAL_TABLET | Freq: Once | ORAL | Status: AC
  Administered 2017-04-27: 60 mg via ORAL
  Filled 2017-04-27: qty 1

## 2017-04-27 MED ORDER — CYCLOBENZAPRINE HCL 10 MG PO TABS: 10.0000 mg | ORAL_TABLET | Freq: Every day | ORAL | 0 refills | Status: DC

## 2017-04-27 MED FILL — CYCLOBENZAPRINE 10 MG TAB: 10 | 10 days supply | Qty: 10 | Fill #0

## 2017-04-27 MED FILL — predniSONE 10 MG TABS: 10 | 4 days supply | Qty: 16 | Fill #0

## 2017-04-27 NOTE — Discharge Instructions (Signed)
Begin taking prednisone starting tomorrow as prescribed. He received the first dose today. Do not take ibuprofen, Advil, Aleve, or Motrin while on this medication. You may take Tylenol. After that, you may alternate 600 mg of ibuprofen and [phone] mg of Tylenol every 3 hours as needed for pain. Do not exceed 4000 mg of Tylenol daily. You may take Flexeril at night as needed for muscle spasm, but do not drive, drink alcohol, or operate heavy machinery on this medication as it may make you drowsy. Apply ice or heat to the affected area for comfort., You may do some gentle stretching to avoid stiffness during hot showers and baths. Follow-up with a primary care physician or neurosurgeon or orthopedist for reevaluation of your symptoms. Return to the ED immediately if any concerning signs or symptoms develop such as loss of control of bowel or bladder, fevers, or new or worsening numbness or weakness.

## 2017-04-27 NOTE — ED Triage Notes (Signed)
Pt reports fall 4 days ago, landed backward, lower  Back pain. denies head injury. Also reports right shoulder arm radiating to right upper chest .

## 2017-04-27 NOTE — ED Provider Notes (Signed)
[facility] DEPT [facility] Provider Note   CSN: [id number] Arrival date & time: 04/27/17  0944     History   Chief Complaint Chief Complaint  Patient presents with  . Fall    lower back     HPI Phyllis Dalton is a 26 y.o. female who presents today with chief complaint acute onset, constant low back pain for 4 days as well as 1 day of intermittent right shoulder pain. She states that on Sunday she tripped on her dog and fell backwards on her back onto some cement. She denies head injury or loss of consciousness. She states that since then she has had an acute aggravation of her usual sciatica symptoms which include radiating pain down the right leg as well as circumferential numbness of the right leg. She endorses intermittent numbness and tingling as well. Pain is primarily localized to the right side of the back which she states that at times radiates to the left side of her back because of her compensatory gait on the left leg. Pain worsens with sitting in a position for a prolonged period of time and on palpation. She states that this feels like her usual sciatica, but this has not been a problem for her until the fall. She also endorses acute onset of right shoulder pain with radiation to the upper chest. She states pain is a dull ache and worsens with deep inspiration and with movement of the right shoulder. She denies radiation down the arm, SOB, chest pressure, fevers, or chills. She has tried exercises for her low back as well as icy hot with some relief. She has not tried any medication for her symptoms. Denies bowel or bladder incontinence, saddle anesthesia, IV drug use, fevers, or history of cancer.  The history is provided by the patient.    Past Medical History:  Diagnosis Date  . Depression   . Diabetes mellitus without complication ([place])   . Obesity   . Panic attack   . Trichomonal vaginitis     Patient Active Problem List   Diagnosis Date Noted  . Major depressive  disorder, single episode, severe, without mention of psychotic behavior--hospitalized during pregnancy at [facility] 03/21/2013  . Panic attack     Past Surgical History:  Procedure Laterality Date  . addenoidectomy    . MOUTH SURGERY    . TONSILLECTOMY      OB History    Gravida Para Term Preterm AB Living   2 2 2  0 0 2   SAB TAB Ectopic Multiple Live Births   0 0 0 0 2       Home Medications    Prior to Admission medications   Medication Sig Start Date End Date Taking? Authorizing Provider  cyclobenzaprine (FLEXERIL) 10 MG tablet Take 1 tablet (10 mg total) by mouth at bedtime. 04/27/17   Nils Flack, Mina A, PA-C  predniSONE (DELTASONE) 10 MG tablet Take 4 tablets (40 mg total) by mouth daily with breakfast. 04/27/17 05/01/17  Renita Papa, PA-C    Family History Family History  Problem Relation Age of Onset  . Diabetes Mother   . HIV Mother   . Diabetes Other     Social History Social History  Substance Use Topics  . Smoking status: Former Smoker    Types: Cigarettes    Quit date: 12/25/2012  . Smokeless tobacco: Never Used  . Alcohol use No     Comment: rare     Allergies   Patient has no  known allergies.   Review of Systems Review of Systems  Constitutional: Negative for chills and fever.  Respiratory: Negative for shortness of breath.   Cardiovascular: Negative for chest pain.  Gastrointestinal: Negative for abdominal pain, nausea and vomiting.  Genitourinary:       No bowel or bladder incontinence  Musculoskeletal: Positive for arthralgias (right shoulder) and back pain. Negative for neck pain and neck stiffness.  Neurological: Positive for numbness. Negative for weakness, light-headedness and headaches.  All other systems reviewed and are negative.    Physical Exam Updated Vital Signs BP 108/70 (BP Location: Left Arm)   Pulse (!) 101   Temp 98.4 F (36.9 C) (Oral)   Resp 20   Ht 5\' 10"  (1.778 m)   LMP 04/27/2017   SpO2 100%   Physical Exam    Constitutional: She is oriented to person, place, and time. She appears well-developed and well-nourished. No distress.  HENT:  Head: Normocephalic and atraumatic.  Eyes: Pupils are equal, round, and reactive to light. Conjunctivae and EOM are normal. Right eye exhibits no discharge. Left eye exhibits no discharge.  Neck: Normal range of motion. Neck supple. No JVD present. No tracheal deviation present.  Cardiovascular: Normal rate and intact distal pulses.   2+ radial and DP/PT pulses bl, negative Homan's bl   Pulmonary/Chest: Effort normal and breath sounds normal. She exhibits tenderness.  Right upper chest wall tender to palpation with tenderness to palpation of the clavicle. Equal rise and fall of chest, no increased work of breathing, no paradoxical wall motion  Abdominal: Soft. Bowel sounds are normal. She exhibits no distension. There is no tenderness.  Musculoskeletal: She exhibits tenderness. She exhibits no edema.  Diffuse right-sided paraspinal muscle tenderness and spasm with right SI joint tenderness. No deformity, crepitus, or step-off noted. No focal tenderness to palpation of the spine. 5/5 strength of the BV and BLE major muscle groups. Pain is alleviated on flexion of the lumbar spine, aggravated by extension of the lumbar spine. Positive straight leg raise on the right.   There is right shoulder tenderness to palpation overlying the deltoid. No deformity, crepitus, or swelling noted. Positive empty can sign on the right.  Neurological: She is alert and oriented to person, place, and time. A sensory deficit is present. No cranial nerve deficit.  Fluent speech, no facial droop, altered sensation of the right lower extremity circumferentially. Patient states this is typical of her usual sciatica. Antalgic gait, but patient able to heel walk and toe walk without difficulty. Good grip strength.   Skin: Skin is warm and dry. No erythema.  Psychiatric: She has a normal mood and  affect. Her behavior is normal.  Nursing note and vitals reviewed.    ED Treatments / Results  Labs (all labs ordered are listed, but only abnormal results are displayed) Labs Reviewed - No data to display  EKG  EKG Interpretation None       Radiology No results found.  Procedures Procedures (including critical care time)  Medications Ordered in ED Medications  predniSONE (DELTASONE) tablet 60 mg (60 mg Oral Given 04/27/17 1042)     Initial Impression / Assessment and Plan / ED Course  I have reviewed the triage vital signs and the nursing notes.  Pertinent labs & imaging results that were available during my care of the patient were reviewed by me and considered in my medical decision making (see chart for details).     Patient with acute aggravation of sciatica after fall.  Also coming in with right-sided shoulder pain with radiation to the upper chest which is reproducible on palpation. Nontoxic in appearance. Afebrile, vital signs are at patient's baseline. I doubt ACS or MI or acute cardiopulmonary abnormality contributing to her right shoulder pain, this appears to be entirely musculoskeletal. I doubt fracture or dislocation. It she also denies loss of consciousness or headache, and I doubt acute intracranial abnormality. She has no red flag signs concerning for cauda equina. Her numbness and pain are in the usual distribution of her sciatica symptoms. She is ambulatory without difficulty while in the ED, but it is painful for her. Denies history of diabetes. Will discharge with prednisone taper, Flexeril, and discussed NSAIDs and Tylenol. She'll follow-up with primary care physician or neurosurgery for reevaluation. Discussed indications for return to the ED. Pt verbalized understanding of and agreement with plan and is safe for discharge home at this time.   Final Clinical Impressions(s) / ED Diagnoses   Final diagnoses:  Fall, initial encounter  Acute right-sided low  back pain with right-sided sciatica  Acute pain of right shoulder    New Prescriptions New Prescriptions   CYCLOBENZAPRINE (FLEXERIL) 10 MG TABLET    Take 1 tablet (10 mg total) by mouth at bedtime.   PREDNISONE (DELTASONE) 10 MG TABLET    Take 4 tablets (40 mg total) by mouth daily with breakfast.     Renita Papa, PA-C 04/27/17 1122    Malvin Johns, MD 04/27/17 1146

## 2017-07-08 ENCOUNTER — Emergency Department (HOSPITAL_COMMUNITY)
Admission: EM | Admit: 2017-07-08 | Discharge: 2017-07-09 | Disposition: A | Discharge status: Home / Self Care | Payer: Medicaid Other | Attending: Emergency Medicine | Admitting: Emergency Medicine

## 2017-07-08 ENCOUNTER — Other Ambulatory Visit: Payer: Self-pay

## 2017-07-08 ENCOUNTER — Encounter (HOSPITAL_COMMUNITY): Payer: Self-pay | Admitting: Emergency Medicine

## 2017-07-08 DIAGNOSIS — Z87891 Personal history of nicotine dependence: Secondary | ICD-10-CM | POA: Insufficient documentation

## 2017-07-08 DIAGNOSIS — J029 Acute pharyngitis, unspecified: Secondary | ICD-10-CM | POA: Diagnosis present

## 2017-07-08 DIAGNOSIS — B9789 Other viral agents as the cause of diseases classified elsewhere: Secondary | ICD-10-CM

## 2017-07-08 DIAGNOSIS — E119 Type 2 diabetes mellitus without complications: Secondary | ICD-10-CM | POA: Insufficient documentation

## 2017-07-08 DIAGNOSIS — J069 Acute upper respiratory infection, unspecified: Secondary | ICD-10-CM | POA: Diagnosis not present

## 2017-07-08 NOTE — ED Triage Notes (Signed)
Patient complaining of a sore throat, nasal congestion, and headache. Patient states she has had diarrhea all day today. Patient state she is having hot flashes and has not been able to eat or drink for the past two days. Patient states this started two days ago.

## 2017-07-09 LAB — RAPID STREP SCREEN (MED CTR MEBANE ONLY): Streptococcus, Group A Screen (Direct): NEGATIVE

## 2017-07-09 MED ORDER — KETOROLAC TROMETHAMINE 30 MG/ML IJ SOLN
30.0000 mg | Freq: Once | INTRAMUSCULAR | Status: AC
  Administered 2017-07-09: 30 mg via INTRAMUSCULAR
  Filled 2017-07-09: qty 1

## 2017-07-09 MED ORDER — BENZONATATE 100 MG PO CAPS: 100.0000 mg | ORAL_CAPSULE | Freq: Three times a day (TID) | ORAL | 0 refills | Status: DC | PRN

## 2017-07-09 NOTE — ED Provider Notes (Signed)
[place] DEPT Provider Note   CSN: [id number] Arrival date & time: 07/08/17  2207     History   Chief Complaint Chief Complaint  Patient presents with  . Nasal Congestion  . Headache  . Sore Throat    HPI Phyllis Dalton is a 26 y.o. female.  The history is provided by the patient and medical records. No language interpreter was used.  Headache   Pertinent negatives include no shortness of breath, no nausea and no vomiting.  Sore Throat  Associated symptoms include headaches. Pertinent negatives include no chest pain, no abdominal pain and no shortness of breath.   Phyllis Dalton is a 26 y.o. female  with a PMH of DM, depression who presents to the Emergency Department complaining of sore throat x 2 days. Associated symptoms include nasal congestion, headache, cough x 2 days and 2-3 nonbloody loose stools which began today. She has tried Tylenol, Vick's Vapor Rub and OTC cold and flu medication with little improvement. No known sick contacts. Denies abdominal pain, shortness of breath, chest pain, nausea, vomiting, fever, chills.  Past Medical History:  Diagnosis Date  . Depression   . Diabetes mellitus without complication ([place])   . Obesity   . Panic attack   . Trichomonal vaginitis     Patient Active Problem List   Diagnosis Date Noted  . Major depressive disorder, single episode, severe, without mention of psychotic behavior--hospitalized during pregnancy at [facility] 03/21/2013  . Panic attack     Past Surgical History:  Procedure Laterality Date  . addenoidectomy    . MOUTH SURGERY    . TONSILLECTOMY      OB History    Gravida Para Term Preterm AB Living   2 2 2  0 0 2   SAB TAB Ectopic Multiple Live Births   0 0 0 0 2       Home Medications    Prior to Admission medications   Medication Sig Start Date End Date Taking? Authorizing Provider  benzonatate (TESSALON) 100 MG capsule Take 1 capsule (100 mg total) 3 (three)  times daily as needed by mouth for cough. 07/09/17   Ward, Ozella Almond, PA-C  cyclobenzaprine (FLEXERIL) 10 MG tablet Take 1 tablet (10 mg total) by mouth at bedtime. 04/27/17   Renita Papa, PA-C    Family History Family History  Problem Relation Age of Onset  . Diabetes Mother   . HIV Mother   . Diabetes Other     Social History Social History   Tobacco Use  . Smoking status: Former Smoker    Types: Cigarettes    Last attempt to quit: 12/25/2012    Years since quitting: 4.5  . Smokeless tobacco: Never Used  Substance Use Topics  . Alcohol use: No    Comment: rare  . Drug use: No    Comment: last use Oct 2014     Allergies   Patient has no known allergies.   Review of Systems Review of Systems  HENT: Positive for congestion and sore throat. Negative for sinus pressure and trouble swallowing.   Respiratory: Positive for cough. Negative for shortness of breath.   Cardiovascular: Negative for chest pain.  Gastrointestinal: Positive for diarrhea. Negative for abdominal pain, blood in stool, constipation, nausea and vomiting.  Skin: Negative for rash.  Neurological: Positive for headaches. Negative for dizziness, syncope, weakness, light-headedness and numbness.  All other systems reviewed and are negative.    Physical Exam Updated Vital  Signs BP 115/76 (BP Location: Left Arm)   Pulse 92   Temp 98.4 F (36.9 C) (Oral)   Resp 18   Ht 5\' 10"  (1.778 m)   Wt 113.4 kg (250 lb)   LMP 06/28/2017   SpO2 100%   BMI 35.87 kg/m   Physical Exam  Constitutional: She is oriented to person, place, and time. She appears well-developed and well-nourished. No distress.  HENT:  Head: Normocephalic and atraumatic.  OP with erythema, no exudates or tonsillar hypertrophy. + nasal congestion with mucosal edema.   Neck: Normal range of motion. Neck supple.  No meningeal signs.   Cardiovascular: Normal rate, regular rhythm and normal heart sounds.  Pulmonary/Chest: Effort normal.   Lungs are clear to auscultation bilaterally - no w/r/r  Abdominal: Soft. She exhibits no distension. There is no tenderness.  Musculoskeletal: Normal range of motion.  Neurological: She is alert and oriented to person, place, and time.  Skin: Skin is warm and dry. She is not diaphoretic.  Nursing note and vitals reviewed.    ED Treatments / Results  Labs (all labs ordered are listed, but only abnormal results are displayed) Labs Reviewed  RAPID STREP SCREEN (NOT AT [facility])  CULTURE, GROUP A STREP [facility])    EKG  EKG Interpretation None       Radiology No results found.  Procedures Procedures (including critical care time)  Medications Ordered in ED Medications  ketorolac (TORADOL) 30 MG/ML injection 30 mg (30 mg Intramuscular Given 07/09/17 0104)     Initial Impression / Assessment and Plan / ED Course  I have reviewed the triage vital signs and the nursing notes.  Pertinent labs & imaging results that were available during my care of the patient were reviewed by me and considered in my medical decision making (see chart for details).    Phyllis Dalton is a 26 y.o. female who presents to ED for sore throat, cough, congestion.   On exam, patient is afebrile, non-toxic appearing with a clear lung exam. Mild rhinorrhea and OP with erythema but no exudates or tonsillar hypertrophy.  Rapid strep negative.   Sxs today likely due to viral URI.Symptomatic home care instructions discussed. Rx for Tessalon given. PCP follow up strongly encouraged if symptoms persist. Reasons to return to ER discussed. All questions answered.   Blood pressure 115/76, pulse 92, temperature 98.4 F (36.9 C), temperature source Oral, resp. rate 18, height 5\' 10"  (1.778 m), weight 113.4 kg (250 lb), last menstrual period 06/28/2017, SpO2 100 %.  Final Clinical Impressions(s) / ED Diagnoses   Final diagnoses:  Viral URI with cough    ED Discharge Orders        Ordered    benzonatate  (TESSALON) 100 MG capsule  3 times daily PRN     07/09/17 0100       Ward, Ozella Almond, PA-C 07/09/17 0109    Little, Wenda Overland, MD 07/09/17 [id number]

## 2017-07-09 NOTE — Discharge Instructions (Signed)
It was my pleasure taking care of you today!  Your symptoms are likely due to a viral upper respiratory infection. Fortunately, we did not see evidence of serious infection and can treat your symptoms. Flonase and mucinex for nasal congestion, tessalon as needed for cough. Alternate between Tylenol and ibuprofen as needed for body aches / fevers.   Rest, drink plenty of fluids to be sure you are staying hydrated.   Please follow up with your primary doctor for discussion of your diagnoses and further evaluation after today's visit if symptoms persist longer than 7 days; Return to the ER for high fevers, difficulty breathing or other concerning symptoms

## 2017-07-11 LAB — CULTURE, GROUP A STREP (THRC)

## 2017-08-07 ENCOUNTER — Inpatient Hospital Stay (HOSPITAL_COMMUNITY): Payer: Medicaid Other

## 2017-08-07 ENCOUNTER — Inpatient Hospital Stay (HOSPITAL_COMMUNITY)
Admission: AD | Admit: 2017-08-07 | Discharge: 2017-08-07 | Disposition: A | Discharge status: Home / Self Care | Payer: Medicaid Other | Source: Ambulatory Visit | Attending: Obstetrics & Gynecology | Admitting: Obstetrics & Gynecology

## 2017-08-07 ENCOUNTER — Encounter (HOSPITAL_COMMUNITY): Payer: Self-pay | Admitting: *Deleted

## 2017-08-07 DIAGNOSIS — Z3A01 Less than 8 weeks gestation of pregnancy: Secondary | ICD-10-CM | POA: Diagnosis not present

## 2017-08-07 DIAGNOSIS — E669 Obesity, unspecified: Secondary | ICD-10-CM | POA: Insufficient documentation

## 2017-08-07 DIAGNOSIS — Z87891 Personal history of nicotine dependence: Secondary | ICD-10-CM | POA: Insufficient documentation

## 2017-08-07 DIAGNOSIS — Z8619 Personal history of other infectious and parasitic diseases: Secondary | ICD-10-CM | POA: Diagnosis not present

## 2017-08-07 DIAGNOSIS — Z9889 Other specified postprocedural states: Secondary | ICD-10-CM | POA: Diagnosis not present

## 2017-08-07 DIAGNOSIS — O9989 Other specified diseases and conditions complicating pregnancy, childbirth and the puerperium: Secondary | ICD-10-CM | POA: Diagnosis not present

## 2017-08-07 DIAGNOSIS — R1030 Lower abdominal pain, unspecified: Secondary | ICD-10-CM

## 2017-08-07 DIAGNOSIS — O3680X0 Pregnancy with inconclusive fetal viability, not applicable or unspecified: Secondary | ICD-10-CM | POA: Insufficient documentation

## 2017-08-07 DIAGNOSIS — Z833 Family history of diabetes mellitus: Secondary | ICD-10-CM | POA: Diagnosis not present

## 2017-08-07 DIAGNOSIS — O24911 Unspecified diabetes mellitus in pregnancy, first trimester: Secondary | ICD-10-CM | POA: Diagnosis not present

## 2017-08-07 DIAGNOSIS — Z830 Family history of human immunodeficiency virus [HIV] disease: Secondary | ICD-10-CM | POA: Diagnosis not present

## 2017-08-07 DIAGNOSIS — O99211 Obesity complicating pregnancy, first trimester: Secondary | ICD-10-CM | POA: Diagnosis not present

## 2017-08-07 DIAGNOSIS — O26899 Other specified pregnancy related conditions, unspecified trimester: Secondary | ICD-10-CM

## 2017-08-07 DIAGNOSIS — R109 Unspecified abdominal pain: Secondary | ICD-10-CM | POA: Diagnosis present

## 2017-08-07 LAB — URINALYSIS, ROUTINE W REFLEX MICROSCOPIC
Bilirubin Urine: NEGATIVE
Glucose, UA: NEGATIVE mg/dL
Hgb urine dipstick: NEGATIVE
Ketones, ur: NEGATIVE mg/dL
Nitrite: NEGATIVE
Protein, ur: NEGATIVE mg/dL
Specific Gravity, Urine: 1.023 (ref 1.005–1.030)
pH: 5 (ref 5.0–8.0)

## 2017-08-07 LAB — HCG, QUANTITATIVE, PREGNANCY: hCG, Beta Chain, Quant, S: 41 m[IU]/mL — ABNORMAL HIGH (ref <5)

## 2017-08-07 LAB — POCT PREGNANCY, URINE: Preg Test, Ur: POSITIVE — AB

## 2017-08-07 NOTE — MAU Provider Note (Cosign Needed)
History     CSN: [id number]  Arrival date and time: 08/07/17 2034   None     No chief complaint on file.  26 yo G3P2002 at [redacted]w[redacted]d gestation based on LMP here for abdominal pain x 1 day. Says she feels constipated and last BM was yesterday. Denies vaginal bleeding. Did not know she was pregnant but says she was aware she missed her period this month. Periods have been regular for past few months.      Past Medical History:  Diagnosis Date  . Depression   . Diabetes mellitus without complication ([place])   . Obesity   . Panic attack   . Trichomonal vaginitis     Past Surgical History:  Procedure Laterality Date  . addenoidectomy    . MOUTH SURGERY    . TONSILLECTOMY      Family History  Problem Relation Age of Onset  . Diabetes Mother   . HIV Mother   . Diabetes Other     Social History   Tobacco Use  . Smoking status: Former Smoker    Types: Cigarettes    Last attempt to quit: 12/25/2012    Years since quitting: 4.6  . Smokeless tobacco: Never Used  Substance Use Topics  . Alcohol use: No    Comment: rare  . Drug use: No    Comment: used  today 08-07-2017    Allergies: No Known Allergies  Medications Prior to Admission  Medication Sig Dispense Refill Last Dose  . benzonatate (TESSALON) 100 MG capsule Take 1 capsule (100 mg total) 3 (three) times daily as needed by mouth for cough. 21 capsule 0   . cyclobenzaprine (FLEXERIL) 10 MG tablet Take 1 tablet (10 mg total) by mouth at bedtime. 10 tablet 0     Review of Systems  Constitutional: Negative for activity change and appetite change.  HENT: Negative for congestion and dental problem.   Eyes: Negative for discharge and itching.  Respiratory: Negative for apnea and chest tightness.   Cardiovascular: Negative for chest pain and leg swelling.  Gastrointestinal: Positive for abdominal pain. Negative for vomiting.  Endocrine: Negative for cold intolerance and heat intolerance.  Genitourinary: Negative for  difficulty urinating, dysuria and vaginal bleeding.  Musculoskeletal: Negative for arthralgias and back pain.  Neurological: Negative for dizziness and light-headedness.  Hematological: Negative for adenopathy. Does not bruise/bleed easily.  Psychiatric/Behavioral: Negative for agitation.   Physical Exam   Blood pressure 120/70, pulse 99, temperature 98.6 F (37 C), temperature source Oral, resp. rate 20, height 5\' 7"  (1.702 m), weight 258 lb 4 oz (117.1 kg), last menstrual period 06/29/2017.  Physical Exam  Constitutional: She is oriented to person, place, and time. She appears well-developed and well-nourished.  HENT:  Head: Normocephalic and atraumatic.  Eyes: Conjunctivae are normal. Pupils are equal, round, and reactive to light.  Neck: Normal range of motion. Neck supple.  Cardiovascular: Normal rate and intact distal pulses.  Respiratory: Effort normal. No respiratory distress.  GI: Soft. She exhibits no distension. There is no tenderness. There is no rebound.  Musculoskeletal: Normal range of motion. She exhibits no edema.  Neurological: She is alert and oriented to person, place, and time.  Skin: Skin is warm and dry.  Psychiatric: She has a normal mood and affect. Her behavior is normal.    MAU Course  Procedures  MDM Quant hcg is 41. Transvag US showed no yolk or gestational sac.  Assessment and Plan  1. Pregnancy of unknown location- follow up  in 2 days for repeat hcg.  [employer] 08/07/2017, 10:28 PM

## 2017-08-07 NOTE — Discharge Instructions (Signed)

## 2017-08-07 NOTE — MAU Note (Addendum)
NO CYCLE  THIS MONTH .  LAST SEX-  YESTERDAY.      NO BIRTH CONTROL    LOWER ABD PAIN STARTED YESTERDAY.   SAYS ALL ABD  IS TIGHT . LAST BM- YESTERDAY.  ALSO FEELS  LIKE  NEEDS TO VOID - BUT DOESN'T

## 2017-08-09 ENCOUNTER — Other Ambulatory Visit: Payer: Medicaid Other

## 2017-08-09 DIAGNOSIS — O3680X0 Pregnancy with inconclusive fetal viability, not applicable or unspecified: Secondary | ICD-10-CM

## 2017-08-10 LAB — BETA HCG QUANT (REF LAB): hCG Quant: 81 m[IU]/mL

## 2017-08-11 ENCOUNTER — Inpatient Hospital Stay (HOSPITAL_COMMUNITY)
Admission: AD | Admit: 2017-08-11 | Discharge: 2017-08-11 | Disposition: A | Discharge status: Home / Self Care | Payer: Medicaid Other | Source: Ambulatory Visit | Attending: Obstetrics & Gynecology | Admitting: Obstetrics & Gynecology

## 2017-08-11 ENCOUNTER — Encounter (HOSPITAL_COMMUNITY): Payer: Self-pay

## 2017-08-11 ENCOUNTER — Other Ambulatory Visit: Payer: Self-pay

## 2017-08-11 DIAGNOSIS — O26891 Other specified pregnancy related conditions, first trimester: Secondary | ICD-10-CM | POA: Diagnosis present

## 2017-08-11 DIAGNOSIS — O99211 Obesity complicating pregnancy, first trimester: Secondary | ICD-10-CM | POA: Diagnosis not present

## 2017-08-11 DIAGNOSIS — O24911 Unspecified diabetes mellitus in pregnancy, first trimester: Secondary | ICD-10-CM | POA: Diagnosis not present

## 2017-08-11 DIAGNOSIS — O99341 Other mental disorders complicating pregnancy, first trimester: Secondary | ICD-10-CM | POA: Insufficient documentation

## 2017-08-11 DIAGNOSIS — Z87891 Personal history of nicotine dependence: Secondary | ICD-10-CM | POA: Insufficient documentation

## 2017-08-11 DIAGNOSIS — Z3A01 Less than 8 weeks gestation of pregnancy: Secondary | ICD-10-CM

## 2017-08-11 DIAGNOSIS — Z79899 Other long term (current) drug therapy: Secondary | ICD-10-CM | POA: Diagnosis not present

## 2017-08-11 DIAGNOSIS — F329 Major depressive disorder, single episode, unspecified: Secondary | ICD-10-CM | POA: Diagnosis not present

## 2017-08-11 DIAGNOSIS — R109 Unspecified abdominal pain: Secondary | ICD-10-CM

## 2017-08-11 DIAGNOSIS — R1012 Left upper quadrant pain: Secondary | ICD-10-CM | POA: Insufficient documentation

## 2017-08-11 LAB — COMPREHENSIVE METABOLIC PANEL
ALT: 19 U/L (ref 14–54)
AST: 19 U/L (ref 15–41)
Albumin: 3.8 g/dL (ref 3.5–5.0)
Alkaline Phosphatase: 88 U/L (ref 38–126)
Anion gap: 9 (ref 5–15)
BUN: 15 mg/dL (ref 6–20)
CO2: 26 mmol/L (ref 22–32)
Calcium: 9 mg/dL (ref 8.9–10.3)
Chloride: 103 mmol/L (ref 101–111)
Creatinine, Ser: 0.85 mg/dL (ref 0.44–1.00)
GFR calc Af Amer: >60 mL/min (ref >60)
GFR calc non Af Amer: >60 mL/min (ref >60)
Glucose, Bld: 93 mg/dL (ref 65–99)
Potassium: 4.1 mmol/L (ref 3.5–5.1)
Sodium: 138 mmol/L (ref 135–145)
Total Bilirubin: 0.3 mg/dL (ref 0.3–1.2)
Total Protein: 7 g/dL (ref 6.5–8.1)

## 2017-08-11 LAB — CBC
HCT: 37.8 % (ref 36.0–46.0)
Hemoglobin: 12.2 g/dL (ref 12.0–15.0)
MCH: 26.5 pg (ref 26.0–34.0)
MCHC: 32.3 g/dL (ref 30.0–36.0)
MCV: 82.2 fL (ref 78.0–100.0)
Platelets: 438 10*3/uL — ABNORMAL HIGH (ref 150–400)
RBC: 4.6 MIL/uL (ref 3.87–5.11)
RDW: 13.7 % (ref 11.5–15.5)
WBC: 10.4 10*3/uL (ref 4.0–10.5)

## 2017-08-11 LAB — LIPASE, BLOOD: Lipase: 28 U/L (ref 11–51)

## 2017-08-11 LAB — URINALYSIS, ROUTINE W REFLEX MICROSCOPIC
Bilirubin Urine: NEGATIVE
Glucose, UA: NEGATIVE mg/dL
Hgb urine dipstick: NEGATIVE
Ketones, ur: NEGATIVE mg/dL
Leukocytes, UA: NEGATIVE
Nitrite: NEGATIVE
Protein, ur: NEGATIVE mg/dL
Specific Gravity, Urine: 1.026 (ref 1.005–1.030)
pH: 5 (ref 5.0–8.0)

## 2017-08-11 LAB — HCG, QUANTITATIVE, PREGNANCY: hCG, Beta Chain, Quant, S: 155 m[IU]/mL — ABNORMAL HIGH (ref <5)

## 2017-08-11 MED ORDER — GI COCKTAIL ~~LOC~~
30.0000 mL | Freq: Once | ORAL | Status: AC
  Administered 2017-08-11: 30 mL via ORAL
  Filled 2017-08-11: qty 30

## 2017-08-11 NOTE — MAU Note (Addendum)
Cramping in lower abd for a wk. Stomach is tight. Sometimes get a knot on L side and then it goes away. Denies vag bleeding or d/c. Normal BM this evening. Pt points to L upper quad to where most of pain is. Passing a lot of gas from both ends

## 2017-08-11 NOTE — MAU Provider Note (Signed)
Chief Complaint: Abdominal Pain   First Provider Initiated Contact with Patient 08/11/17 2018        SUBJECTIVE HPI: Phyllis Dalton is a 26 y.o. G3P2002 at [redacted]w[redacted]d by LMP who presents to maternity admissions reporting abdominal pain. Symptoms began Monday. States pain has not changed or worsened since then. Reports pain in LUQ & left side. Describes as sharp cramp like pain. Rates pain 7/10. Has not treated symptoms. States feels like gas pain & reports increase in belching & flatus. Denies fever/chills, n/v, lower abdominal pain, dysuria, diarrhea, constipation, or vaginal bleeding.    Past Medical History:  Diagnosis Date  . Depression   . Diabetes mellitus without complication ([place])   . Obesity   . Panic attack   . Trichomonal vaginitis    Past Surgical History:  Procedure Laterality Date  . addenoidectomy    . MOUTH SURGERY    . TONSILLECTOMY     Social History   Socioeconomic History  . Marital status: Single    Spouse name: Not on file  . Number of children: Not on file  . Years of education: Not on file  . Highest education level: Not on file  Social Needs  . Financial resource strain: Not on file  . Food insecurity - worry: Not on file  . Food insecurity - inability: Not on file  . Transportation needs - medical: Not on file  . Transportation needs - non-medical: Not on file  Occupational History  . Not on file  Tobacco Use  . Smoking status: Former Smoker    Types: Cigarettes    Last attempt to quit: 12/25/2012    Years since quitting: 4.6  . Smokeless tobacco: Never Used  Substance and Sexual Activity  . Alcohol use: No    Comment: rare  . Drug use: No    Comment: used  today 08-07-2017  . Sexual activity: Yes    Birth control/protection: Implant    Comment: two days ago  Other Topics Concern  . Not on file  Social History Narrative  . Not on file   No current facility-administered medications on file prior to encounter.    Current Outpatient  Medications on File Prior to Encounter  Medication Sig Dispense Refill  . benzonatate (TESSALON) 100 MG capsule Take 1 capsule (100 mg total) 3 (three) times daily as needed by mouth for cough. 21 capsule 0  . cyclobenzaprine (FLEXERIL) 10 MG tablet Take 1 tablet (10 mg total) by mouth at bedtime. 10 tablet 0   No Known Allergies  I have reviewed patient's Past Medical Hx, Surgical Hx, Family Hx, Social Hx, medications and allergies.   ROS:  Review of Systems  Constitutional: Negative.   Gastrointestinal: Positive for abdominal pain. Negative for constipation, diarrhea, nausea and vomiting.  Genitourinary: Negative.   Musculoskeletal: Negative for back pain.   Review of Systems  Other systems negative   Physical Exam  Physical Exam Patient Vitals for the past 24 hrs:  BP Temp Temp src Pulse Resp Height Weight  08/11/17 2208 115/66 98.2 F (36.8 C) Oral 87 18 - -  08/11/17 2017 122/62 98.9 F (37.2 C) Oral (!) 103 18 - -  08/11/17 1959 127/70 98.6 F (37 C) - 85 18 5\' 7"  (1.702 m) 260 lb (117.9 kg)   Constitutional: Well-developed, well-nourished female in no acute distress.  Cardiovascular: normal rate Respiratory: normal effort GI: Abd soft, non-tender. Pos BS x 4 MS: Extremities nontender, no edema, normal ROM Neurologic: Alert  and oriented x 4.  GU: Neg CVAT.   LAB RESULTS Results for orders placed or performed during the hospital encounter of 08/11/17 (from the past 24 hour(s))  Urinalysis, Routine w reflex microscopic     Status: Abnormal   Collection Time: 08/11/17  8:05 PM  Result Value Ref Range   Color, Urine YELLOW YELLOW   APPearance HAZY (A) CLEAR   Specific Gravity, Urine 1.026 1.005 - 1.030   pH 5.0 5.0 - 8.0   Glucose, UA NEGATIVE NEGATIVE mg/dL   Hgb urine dipstick NEGATIVE NEGATIVE   Bilirubin Urine NEGATIVE NEGATIVE   Ketones, ur NEGATIVE NEGATIVE mg/dL   Protein, ur NEGATIVE NEGATIVE mg/dL   Nitrite NEGATIVE NEGATIVE   Leukocytes, UA NEGATIVE  NEGATIVE  CBC     Status: Abnormal   Collection Time: 08/11/17  8:18 PM  Result Value Ref Range   WBC 10.4 4.0 - 10.5 K/uL   RBC 4.60 3.87 - 5.11 MIL/uL   Hemoglobin 12.2 12.0 - 15.0 g/dL   HCT 37.8 36.0 - 46.0 %   MCV 82.2 78.0 - 100.0 fL   MCH 26.5 26.0 - 34.0 pg   MCHC 32.3 30.0 - 36.0 g/dL   RDW 13.7 11.5 - 15.5 %   Platelets 438 (H) 150 - 400 K/uL  hCG, quantitative, pregnancy     Status: Abnormal   Collection Time: 08/11/17  8:18 PM  Result Value Ref Range   hCG, Beta Chain, Quant, S 155 (H) <5 mIU/mL  Comprehensive metabolic panel     Status: None   Collection Time: 08/11/17  8:18 PM  Result Value Ref Range   Sodium 138 135 - 145 mmol/L   Potassium 4.1 3.5 - 5.1 mmol/L   Chloride 103 101 - 111 mmol/L   CO2 26 22 - 32 mmol/L   Glucose, Bld 93 65 - 99 mg/dL   BUN 15 6 - 20 mg/dL   Creatinine, Ser 0.85 0.44 - 1.00 mg/dL   Calcium 9.0 8.9 - 10.3 mg/dL   Total Protein 7.0 6.5 - 8.1 g/dL   Albumin 3.8 3.5 - 5.0 g/dL   AST 19 15 - 41 U/L   ALT 19 14 - 54 U/L   Alkaline Phosphatase 88 38 - 126 U/L   Total Bilirubin 0.3 0.3 - 1.2 mg/dL   GFR calc non Af Amer >60 >60 mL/min   GFR calc Af Amer >60 >60 mL/min   Anion gap 9 5 - 15  Lipase, blood     Status: None   Collection Time: 08/11/17  8:18 PM  Result Value Ref Range   Lipase 28 11 - 51 U/L       IMAGING   MAU Management/MDM: HCG continues to rise appropriately  Benign exam. Patient remains on phone during exam & during reassessment.  CBC, CMP, lipase normal VSS, NAD   ASSESSMENT 1. Abdominal pain during pregnancy in first trimester   2. Less than [redacted] weeks gestation of pregnancy     PLAN Discharge home Start prenatal care  Pt stable at time of discharge. Encouraged to return here or to other Urgent Care/ED if she develops worsening of symptoms, increase in pain, fever, or other concerning symptoms.    Jorje Guild, FNP 08/11/2017  8:19 PM

## 2017-08-11 NOTE — Discharge Instructions (Signed)
[place] for [place] at [facility]       Phone: [phone]  [facility] for [place] at [place] Phone: [place] for [place] at [place]  Phone: [place] for [place] at [facility]  Phone: [place] for [place] at [place]  Phone: [facility]       Phone: [phone]  [facility]    Phone: [phone]   [facility])    Phone: [facility]    Phone: [phone]  [facility]    Phone: [place] [facility]  Phone: [place]    Phone: [phone]  [facility]   Phone: [phone]  [facility]    Phone: [phone]        To schedule your Maternity Eligibility Appointment, please call [phone].  When you arrive for your appointment you must bring the following items or information listed below.  Your appointment will be rescheduled if you do not have these items or are 15 minutes late. If currently receiving Medicaid, you MUST bring: 1. Medicaid Card 2. Social Security Card 3. Picture ID 4. Proof of Pregnancy 5. Verification of current address if the address on Medicaid card is incorrect "postmarked mail" If not receiving Medicaid, you MUST bring: 1. Social Security Card 2. Picture ID 3. Birth Certificate (if available) Passport or *Green Card 4. Proof of Pregnancy 5. Verification of current address "postmarked mail" for each income presented. 6. Verification of insurance coverage, if any 7. Check stubs from each employer for the previous month (if unable to present check stub  for each week, we will accept check stub for the first and last week ill the same month.) If you can't  locate check stubs, you must bring a letter from the employer(s) and it must have the following information on letterhead, typed, in English: o name of [place] telephone number o how long been with the company, if less than one month o how much person earns per hour o how many hours per week work o the gross pay the person earned for the previous month If you are 27 years old or less, you do not have to bring proof of income unless you work or live with the father of the baby and at that time we will need proof of income from you and/or the father of the baby. Green Card recipients are eligible for Medicaid for Pregnant Women (MPW)

## 2017-08-22 NOTE — L&D Delivery Note (Signed)
Called for standby delivery for Dr. Charlesetta Garibaldi Delivery Note At 1809 a viable female infant was delivered by RN via SVD, presentation: OA. APGAR: 9, 10; weight 6'5.   Placenta status: spontaneously delivered by me, intact via Delena Bali. Cord: 3 vessel. Cord ph n/a. Complications precipitous labor.  Anesthesia: none Lacerations: none Suture used for repair: n/a Est. Blood Loss (mL): 50  Mom to postpartum.  Baby to Couplet care / Skin to Skin.   Julianne Handler, CNM 04/05/2018 6:21 PM

## 2017-09-07 ENCOUNTER — Encounter (HOSPITAL_COMMUNITY): Payer: Self-pay

## 2017-09-07 ENCOUNTER — Inpatient Hospital Stay (HOSPITAL_COMMUNITY)
Admission: AD | Admit: 2017-09-07 | Discharge: 2017-09-07 | Disposition: A | Discharge status: Home / Self Care | Payer: Medicaid Other | Source: Ambulatory Visit | Attending: Obstetrics and Gynecology | Admitting: Obstetrics and Gynecology

## 2017-09-07 ENCOUNTER — Other Ambulatory Visit: Payer: Self-pay

## 2017-09-07 DIAGNOSIS — Z87891 Personal history of nicotine dependence: Secondary | ICD-10-CM | POA: Insufficient documentation

## 2017-09-07 DIAGNOSIS — O219 Vomiting of pregnancy, unspecified: Secondary | ICD-10-CM | POA: Insufficient documentation

## 2017-09-07 DIAGNOSIS — Z3A10 10 weeks gestation of pregnancy: Secondary | ICD-10-CM | POA: Diagnosis not present

## 2017-09-07 DIAGNOSIS — R51 Headache: Secondary | ICD-10-CM | POA: Diagnosis not present

## 2017-09-07 DIAGNOSIS — Z833 Family history of diabetes mellitus: Secondary | ICD-10-CM | POA: Diagnosis not present

## 2017-09-07 DIAGNOSIS — R109 Unspecified abdominal pain: Secondary | ICD-10-CM | POA: Insufficient documentation

## 2017-09-07 DIAGNOSIS — O26891 Other specified pregnancy related conditions, first trimester: Secondary | ICD-10-CM | POA: Diagnosis not present

## 2017-09-07 LAB — URINALYSIS, ROUTINE W REFLEX MICROSCOPIC
Bilirubin Urine: NEGATIVE
Glucose, UA: NEGATIVE mg/dL
Hgb urine dipstick: NEGATIVE
Ketones, ur: NEGATIVE mg/dL
Nitrite: NEGATIVE
Protein, ur: 100 mg/dL — AB
Specific Gravity, Urine: 1.028 (ref 1.005–1.030)
pH: 8 (ref 5.0–8.0)

## 2017-09-07 LAB — COMPREHENSIVE METABOLIC PANEL
ALT: 17 U/L (ref 14–54)
AST: 16 U/L (ref 15–41)
Albumin: 4.2 g/dL (ref 3.5–5.0)
Alkaline Phosphatase: 80 U/L (ref 38–126)
Anion gap: 9 (ref 5–15)
BUN: 9 mg/dL (ref 6–20)
CO2: 25 mmol/L (ref 22–32)
Calcium: 9.9 mg/dL (ref 8.9–10.3)
Chloride: 101 mmol/L (ref 101–111)
Creatinine, Ser: 0.59 mg/dL (ref 0.44–1.00)
GFR calc Af Amer: >60 mL/min (ref >60)
GFR calc non Af Amer: >60 mL/min (ref >60)
Glucose, Bld: 92 mg/dL (ref 65–99)
Potassium: 4.4 mmol/L (ref 3.5–5.1)
Sodium: 135 mmol/L (ref 135–145)
Total Bilirubin: 0.7 mg/dL (ref 0.3–1.2)
Total Protein: 7.8 g/dL (ref 6.5–8.1)

## 2017-09-07 LAB — CBC
HCT: 39.3 % (ref 36.0–46.0)
Hemoglobin: 13 g/dL (ref 12.0–15.0)
MCH: 26.8 pg (ref 26.0–34.0)
MCHC: 33.1 g/dL (ref 30.0–36.0)
MCV: 81 fL (ref 78.0–100.0)
Platelets: 498 10*3/uL — ABNORMAL HIGH (ref 150–400)
RBC: 4.85 MIL/uL (ref 3.87–5.11)
RDW: 13.3 % (ref 11.5–15.5)
WBC: 11.4 10*3/uL — ABNORMAL HIGH (ref 4.0–10.5)

## 2017-09-07 MED ORDER — SODIUM CHLORIDE 0.9 % IV SOLN
8.0000 mg | Freq: Once | INTRAVENOUS | Status: AC
  Administered 2017-09-07: 8 mg via INTRAVENOUS
  Filled 2017-09-07: qty 4

## 2017-09-07 MED ORDER — PANTOPRAZOLE SODIUM 40 MG IV SOLR
40.0000 mg | Freq: Once | INTRAVENOUS | Status: AC
  Administered 2017-09-07: 40 mg via INTRAVENOUS
  Filled 2017-09-07: qty 40

## 2017-09-07 MED ORDER — ONDANSETRON 4 MG PO TBDP: 4.0000 mg | ORAL_TABLET | Freq: Three times a day (TID) | ORAL | 0 refills | Status: DC | PRN

## 2017-09-07 MED ORDER — LACTATED RINGERS IV BOLUS (SEPSIS)
1000.0000 mL | Freq: Once | INTRAVENOUS | Status: AC
  Administered 2017-09-07: 1000 mL via INTRAVENOUS

## 2017-09-07 NOTE — Discharge Instructions (Signed)
Morning Sickness °Morning sickness is when you feel sick to your stomach (nauseous) during pregnancy. This nauseous feeling may or may not come with vomiting. It often occurs in the morning but can be a problem any time of day. Morning sickness is most common during the first trimester, but it may continue throughout pregnancy. While morning sickness is unpleasant, it is usually harmless unless you develop severe and continual vomiting (hyperemesis gravidarum). This condition requires more intense treatment. °What are the causes? °The cause of morning sickness is not completely known but seems to be related to normal hormonal changes that occur in pregnancy. °What increases the risk? °You are at greater risk if you: °· Experienced nausea or vomiting before your pregnancy. °· Had morning sickness during a previous pregnancy. °· Are pregnant with more than one baby, such as twins. ° °How is this treated? °Do not use any medicines (prescription, over-the-counter, or herbal) for morning sickness without first talking to your health care provider. Your health care provider may prescribe or recommend: °· Vitamin B6 supplements. °· Anti-nausea medicines. °· The herbal medicine ginger. ° °Follow these instructions at home: °· Only take over-the-counter or prescription medicines as directed by your health care provider. °· Taking multivitamins before getting pregnant can prevent or decrease the severity of morning sickness in most women. °· Eat a piece of dry toast or unsalted crackers before getting out of bed in the morning. °· Eat five or six small meals a day. °· Eat dry and bland foods (rice, baked potato). Foods high in carbohydrates are often helpful. °· Do not drink liquids with your meals. Drink liquids between meals. °· Avoid greasy, fatty, and spicy foods. °· Get someone to cook for you if the smell of any food causes nausea and vomiting. °· If you feel nauseous after taking prenatal vitamins, take the vitamins at  night or with a snack. °· Snack on protein foods (nuts, yogurt, cheese) between meals if you are hungry. °· Eat unsweetened gelatins for desserts. °· Wearing an acupressure wristband (worn for sea sickness) may be helpful. °· Acupuncture may be helpful. °· Do not smoke. °· Get a humidifier to keep the air in your house free of odors. °· Get plenty of fresh air. °Contact a health care provider if: °· Your home remedies are not working, and you need medicine. °· You feel dizzy or lightheaded. °· You are losing weight. °Get help right away if: °· You have persistent and uncontrolled nausea and vomiting. °· You pass out (faint). °This information is not intended to replace advice given to you by your health care provider. Make sure you discuss any questions you have with your health care provider. °Document Released: 09/29/2006 Document Revised: 01/14/2016 Document Reviewed: 01/23/2013 °Elsevier Interactive Patient Education © 2017 [employer] ° °

## 2017-09-07 NOTE — MAU Provider Note (Signed)
History     CSN: [id number]  Arrival date and time: 09/07/17 1011   First Provider Initiated Contact with Patient 09/07/17 1122      Chief Complaint  Patient presents with  . Abdominal Pain  . Emesis   HPI   Patient is a 27 y.o. G3P2 female at 19 w gestation by LMP who presents to the MAU today for persistent nasuea, vomiting, and lower abdominal cramping for 2 weeks. She reprots that she has about 5 episodes of vomiting each day. She describes it as mostly mucous, yellow, and with some small blood tinge. She has tried taking B6, B12, crackers, peanuts, phnergan, and gingerale without relief. She reports that she is unable to keep any food down and the only liquid she can keep down is milk. She also reports diarrhea that has been persisting for 2 days, but has gotten better.She reprots a decrease in urine output. Also reports increased fatigue and frequent headaches. She has had similar symptoms in her previous 2 pregnancies and she states that her nausea and vomiting persisted the entire pregnancy. She mentioned that she has taken Zofran in prior pregnancies with some relief.   Denies vaginal bleeding, vaginal pain, or vaginal discharge.   She reports an increase in fetal movement and believes that she is more than [redacted] weeks pregnant. She is scheduled for her first [place] on 1/30.   OB History    Gravida Para Term Preterm AB Living   3 2 2  0 0 2   SAB TAB Ectopic Multiple Live Births   0 0 0 0 2      Past Medical History:  Diagnosis Date  . Depression   . Diabetes mellitus without complication ([place])   . Obesity   . Panic attack   . Trichomonal vaginitis     Past Surgical History:  Procedure Laterality Date  . MOUTH SURGERY    . TONSILLECTOMY AND ADENOIDECTOMY      Family History  Problem Relation Age of Onset  . Diabetes Mother   . HIV Mother   . Diabetes Other     Social History   Tobacco Use  . Smoking status: Former Smoker    Types: Cigarettes    Last attempt  to quit: 12/25/2012    Years since quitting: 4.7  . Smokeless tobacco: Never Used  Substance Use Topics  . Alcohol use: No    Comment: rare  . Drug use: No    Comment: used 09/04/17    Allergies: No Known Allergies  No medications prior to admission.    Review of Systems  Constitutional: Positive for fatigue.  Gastrointestinal: Positive for abdominal pain (cramping), diarrhea, nausea and vomiting.  Genitourinary: Positive for decreased urine volume. Negative for vaginal bleeding, vaginal discharge and vaginal pain.  Neurological: Positive for headaches.   Physical Exam   Blood pressure 113/74, pulse 84, temperature 100 F (37.8 C), temperature source Oral, resp. rate 18, weight 115.4 kg (254 lb 8 oz), last menstrual period 06/29/2017, SpO2 (!) 85 %.  Physical Exam  Constitutional: She is oriented to person, place, and time. She appears well-developed and well-nourished.  HENT:  Head: Normocephalic and atraumatic.  Cardiovascular: Normal rate, regular rhythm and normal heart sounds.  Respiratory: Effort normal and breath sounds normal.  GI: Soft. There is tenderness. There is no rebound and no guarding.  Neurological: She is alert and oriented to person, place, and time.  Skin: Skin is warm and dry.   Results for orders placed  or performed during the hospital encounter of 09/07/17 (from the past 24 hour(s))  Urinalysis, Routine w reflex microscopic     Status: Abnormal   Collection Time: 09/07/17 10:35 AM  Result Value Ref Range   Color, Urine AMBER (A) YELLOW   APPearance CLOUDY (A) CLEAR   Specific Gravity, Urine 1.028 1.005 - 1.030   pH 8.0 5.0 - 8.0   Glucose, UA NEGATIVE NEGATIVE mg/dL   Hgb urine dipstick NEGATIVE NEGATIVE   Bilirubin Urine NEGATIVE NEGATIVE   Ketones, ur NEGATIVE NEGATIVE mg/dL   Protein, ur 100 (A) NEGATIVE mg/dL   Nitrite NEGATIVE NEGATIVE   Leukocytes, UA TRACE (A) NEGATIVE   RBC / HPF 0-5 0 - 5 RBC/hpf   WBC, UA 6-30 0 - 5 WBC/hpf    Bacteria, UA RARE (A) NONE SEEN   Squamous Epithelial / LPF TOO NUMEROUS TO COUNT (A) NONE SEEN   Mucus PRESENT   CBC     Status: Abnormal   Collection Time: 09/07/17 12:24 PM  Result Value Ref Range   WBC 11.4 (H) 4.0 - 10.5 K/uL   RBC 4.85 3.87 - 5.11 MIL/uL   Hemoglobin 13.0 12.0 - 15.0 g/dL   HCT 39.3 36.0 - 46.0 %   MCV 81.0 78.0 - 100.0 fL   MCH 26.8 26.0 - 34.0 pg   MCHC 33.1 30.0 - 36.0 g/dL   RDW 13.3 11.5 - 15.5 %   Platelets 498 (H) 150 - 400 K/uL  Comprehensive metabolic panel     Status: None   Collection Time: 09/07/17 12:24 PM  Result Value Ref Range   Sodium 135 135 - 145 mmol/L   Potassium 4.4 3.5 - 5.1 mmol/L   Chloride 101 101 - 111 mmol/L   CO2 25 22 - 32 mmol/L   Glucose, Bld 92 65 - 99 mg/dL   BUN 9 6 - 20 mg/dL   Creatinine, Ser 0.59 0.44 - 1.00 mg/dL   Calcium 9.9 8.9 - 10.3 mg/dL   Total Protein 7.8 6.5 - 8.1 g/dL   Albumin 4.2 3.5 - 5.0 g/dL   AST 16 15 - 41 U/L   ALT 17 14 - 54 U/L   Alkaline Phosphatase 80 38 - 126 U/L   Total Bilirubin 0.7 0.3 - 1.2 mg/dL   GFR calc non Af Amer >60 >60 mL/min   GFR calc Af Amer >60 >60 mL/min   Anion gap 9 5 - 15     MAU Course  Procedures  MDM  LR 1,000 mL over 2 hours Zofran for nausea  Protonix   CBC looking for WBC count for possible infection. Want to r/o gastroenteritis for cause of N/V/D.  CMP looking for electrolyte imbalances for dehydration due to excessive vomiting.   Scheduled to have US done on 1/30 Bedside US  Assessment and Plan  Patient is a 27 y.o. G3P2 female at 10w gestation with Nausea and vomiting.  1. Nausea and vomiting during pregnancy prior to [redacted] weeks gestation Patient specifically requesting Zofran- Zofran 4 mg po q 8 hours for nausea and vomiting  Follow up with OB for prenatal care as planned   Elsworth Soho PA-S2 09/07/2017, 11:41 AM

## 2017-09-07 NOTE — MAU Note (Signed)
Every time she throws up she get sharp pain in her lower abd, tight. Can't keep nothing down. Was given rx for nausea- has not been able to pick it up.started having diarrhea day before yesterday.

## 2017-09-07 NOTE — MAU Provider Note (Signed)
History     CSN: [id number]  Arrival date and time: 09/07/17 1011   First Provider Initiated Contact with Patient 09/07/17 1122      Chief Complaint  Patient presents with  . Abdominal Pain  . Emesis   HPI Phyllis Dalton is a 27 y.o. G3P2002 at [redacted]w[redacted]d who presents with abdominal pain & n/v. States symptoms have been ongoing for 2 weeks. Was previously prescribed diclegis & phenergan but that she can't keep her medicine down. Vomits 5 times per day & has trouble keeping down food/fluids. Reports abdominal pain while vomiting. Denies vaginal bleeding, fever/chills. Diarrhea x 2 days. Has had 2 loose stools today. Has not started prenatal care. 10 wks by LMP & had appropriately rising HCG earlier in the pregnancy. States she feels like she is further along b/c she has felt fetal movement.   OB History    Gravida Para Term Preterm AB Living   3 2 2  0 0 2   SAB TAB Ectopic Multiple Live Births   0 0 0 0 2      Past Medical History:  Diagnosis Date  . Depression   . Diabetes mellitus without complication ([place])   . Obesity   . Panic attack   . Trichomonal vaginitis     Past Surgical History:  Procedure Laterality Date  . MOUTH SURGERY    . TONSILLECTOMY AND ADENOIDECTOMY      Family History  Problem Relation Age of Onset  . Diabetes Mother   . HIV Mother   . Diabetes Other     Social History   Tobacco Use  . Smoking status: Former Smoker    Types: Cigarettes    Last attempt to quit: 12/25/2012    Years since quitting: 4.7  . Smokeless tobacco: Never Used  Substance Use Topics  . Alcohol use: No    Comment: rare  . Drug use: No    Comment: used 09/04/17    Allergies: No Known Allergies  No medications prior to admission.    Review of Systems  Constitutional: Negative.   Gastrointestinal: Positive for abdominal pain, diarrhea, nausea and vomiting. Negative for constipation.  Genitourinary: Negative.    Physical Exam   Blood pressure 113/74, pulse 84,  temperature 98.5 F (36.9 C), temperature source Oral, resp. rate 18, weight 254 lb 8 oz (115.4 kg), last menstrual period 06/29/2017, SpO2 100 %.  Physical Exam  Nursing note and vitals reviewed. Constitutional: She is oriented to person, place, and time. She appears well-developed and well-nourished. No distress.  HENT:  Head: Normocephalic and atraumatic.  Eyes: Conjunctivae are normal. Right eye exhibits no discharge. Left eye exhibits no discharge. No scleral icterus.  Neck: Normal range of motion.  Cardiovascular: Normal rate, regular rhythm and normal heart sounds.  No murmur heard. Respiratory: Effort normal and breath sounds normal. No respiratory distress. She has no wheezes.  GI: Soft. There is no tenderness.  Neurological: She is alert and oriented to person, place, and time.  Skin: Skin is warm and dry. She is not diaphoretic.  Psychiatric: She has a normal mood and affect. Her behavior is normal. Judgment and thought content normal.    MAU Course  Procedures Results for orders placed or performed during the hospital encounter of 09/07/17 (from the past 24 hour(s))  Urinalysis, Routine w reflex microscopic     Status: Abnormal   Collection Time: 09/07/17 10:35 AM  Result Value Ref Range   Color, Urine AMBER (A) YELLOW  APPearance CLOUDY (A) CLEAR   Specific Gravity, Urine 1.028 1.005 - 1.030   pH 8.0 5.0 - 8.0   Glucose, UA NEGATIVE NEGATIVE mg/dL   Hgb urine dipstick NEGATIVE NEGATIVE   Bilirubin Urine NEGATIVE NEGATIVE   Ketones, ur NEGATIVE NEGATIVE mg/dL   Protein, ur 100 (A) NEGATIVE mg/dL   Nitrite NEGATIVE NEGATIVE   Leukocytes, UA TRACE (A) NEGATIVE   RBC / HPF 0-5 0 - 5 RBC/hpf   WBC, UA 6-30 0 - 5 WBC/hpf   Bacteria, UA RARE (A) NONE SEEN   Squamous Epithelial / LPF TOO NUMEROUS TO COUNT (A) NONE SEEN   Mucus PRESENT   CBC     Status: Abnormal   Collection Time: 09/07/17 12:24 PM  Result Value Ref Range   WBC 11.4 (H) 4.0 - 10.5 K/uL   RBC 4.85  3.87 - 5.11 MIL/uL   Hemoglobin 13.0 12.0 - 15.0 g/dL   HCT 39.3 36.0 - 46.0 %   MCV 81.0 78.0 - 100.0 fL   MCH 26.8 26.0 - 34.0 pg   MCHC 33.1 30.0 - 36.0 g/dL   RDW 13.3 11.5 - 15.5 %   Platelets 498 (H) 150 - 400 K/uL  Comprehensive metabolic panel     Status: None   Collection Time: 09/07/17 12:24 PM  Result Value Ref Range   Sodium 135 135 - 145 mmol/L   Potassium 4.4 3.5 - 5.1 mmol/L   Chloride 101 101 - 111 mmol/L   CO2 25 22 - 32 mmol/L   Glucose, Bld 92 65 - 99 mg/dL   BUN 9 6 - 20 mg/dL   Creatinine, Ser 0.59 0.44 - 1.00 mg/dL   Calcium 9.9 8.9 - 10.3 mg/dL   Total Protein 7.8 6.5 - 8.1 g/dL   Albumin 4.2 3.5 - 5.0 g/dL   AST 16 15 - 41 U/L   ALT 17 14 - 54 U/L   Alkaline Phosphatase 80 38 - 126 U/L   Total Bilirubin 0.7 0.3 - 1.2 mg/dL   GFR calc non Af Amer >60 >60 mL/min   GFR calc Af Amer >60 >60 mL/min   Anion gap 9 5 - 15    MDM Informal bedside ultrasound shows IUP with cardiac activity measuring ~9wks by CRL IV fluids & zofran 4mg . Pt reports improvement in symptoms. Patient requesting rx for zofran; states that's the only thing that works for her & she used in previous 2 pregnancies.  Discussed with the patient the risk of Zofran use in the first trimester of pregnancy. Risks of use in the first trimester include birth defects in babies, specifically cleft lip/palate.  Pt states understanding and plans to use Zofran sparingly.     Assessment and Plan  A: 1. Nausea and vomiting during pregnancy prior to [redacted] weeks gestation    P: Discharge home Rx zofran Discussed reasons to return to MAU Start prenatal care  Jorje Guild 09/07/2017, 11:42 AM

## 2017-09-13 ENCOUNTER — Other Ambulatory Visit: Payer: Self-pay

## 2017-09-13 ENCOUNTER — Inpatient Hospital Stay (HOSPITAL_COMMUNITY)
Admission: AD | Admit: 2017-09-13 | Discharge: 2017-09-13 | Disposition: A | Discharge status: Home / Self Care | Payer: Medicaid Other | Source: Ambulatory Visit | Attending: Obstetrics & Gynecology | Admitting: Obstetrics & Gynecology

## 2017-09-13 DIAGNOSIS — O219 Vomiting of pregnancy, unspecified: Secondary | ICD-10-CM | POA: Insufficient documentation

## 2017-09-13 DIAGNOSIS — K5900 Constipation, unspecified: Secondary | ICD-10-CM | POA: Insufficient documentation

## 2017-09-13 DIAGNOSIS — O99611 Diseases of the digestive system complicating pregnancy, first trimester: Secondary | ICD-10-CM | POA: Insufficient documentation

## 2017-09-13 DIAGNOSIS — Z3A10 10 weeks gestation of pregnancy: Secondary | ICD-10-CM | POA: Diagnosis not present

## 2017-09-13 DIAGNOSIS — K5901 Slow transit constipation: Secondary | ICD-10-CM

## 2017-09-13 DIAGNOSIS — K117 Disturbances of salivary secretion: Secondary | ICD-10-CM | POA: Insufficient documentation

## 2017-09-13 LAB — URINALYSIS, ROUTINE W REFLEX MICROSCOPIC
Bilirubin Urine: NEGATIVE
Glucose, UA: NEGATIVE mg/dL
Hgb urine dipstick: NEGATIVE
Ketones, ur: NEGATIVE mg/dL
Nitrite: NEGATIVE
Protein, ur: 100 mg/dL — AB
Specific Gravity, Urine: 1.028 (ref 1.005–1.030)
pH: 7 (ref 5.0–8.0)

## 2017-09-13 LAB — COMPREHENSIVE METABOLIC PANEL
ALT: 24 U/L (ref 14–54)
AST: 19 U/L (ref 15–41)
Albumin: 3.9 g/dL (ref 3.5–5.0)
Alkaline Phosphatase: 74 U/L (ref 38–126)
Anion gap: 10 (ref 5–15)
BUN: 8 mg/dL (ref 6–20)
CO2: 25 mmol/L (ref 22–32)
Calcium: 9.5 mg/dL (ref 8.9–10.3)
Chloride: 99 mmol/L — ABNORMAL LOW (ref 101–111)
Creatinine, Ser: 0.65 mg/dL (ref 0.44–1.00)
GFR calc Af Amer: >60 mL/min (ref >60)
GFR calc non Af Amer: >60 mL/min (ref >60)
Glucose, Bld: 99 mg/dL (ref 65–99)
Potassium: 4.2 mmol/L (ref 3.5–5.1)
Sodium: 134 mmol/L — ABNORMAL LOW (ref 135–145)
Total Bilirubin: 0.8 mg/dL (ref 0.3–1.2)
Total Protein: 7.5 g/dL (ref 6.5–8.1)

## 2017-09-13 LAB — CBC
HCT: 39.7 % (ref 36.0–46.0)
Hemoglobin: 13.3 g/dL (ref 12.0–15.0)
MCH: 27 pg (ref 26.0–34.0)
MCHC: 33.5 g/dL (ref 30.0–36.0)
MCV: 80.5 fL (ref 78.0–100.0)
Platelets: 462 10*3/uL — ABNORMAL HIGH (ref 150–400)
RBC: 4.93 MIL/uL (ref 3.87–5.11)
RDW: 13.1 % (ref 11.5–15.5)
WBC: 12.6 10*3/uL — ABNORMAL HIGH (ref 4.0–10.5)

## 2017-09-13 MED ORDER — GLYCOPYRROLATE 2 MG PO TABS: 2.0000 mg | ORAL_TABLET | Freq: Three times a day (TID) | ORAL | 0 refills | Status: DC

## 2017-09-13 MED ORDER — PROMETHAZINE HCL 25 MG/ML IJ SOLN
25.0000 mg | Freq: Once | INTRAMUSCULAR | Status: AC
  Administered 2017-09-13: 25 mg via INTRAVENOUS
  Filled 2017-09-13: qty 1

## 2017-09-13 MED ORDER — METOCLOPRAMIDE HCL 10 MG PO TABS: 10.0000 mg | ORAL_TABLET | Freq: Three times a day (TID) | ORAL | 0 refills | Status: DC

## 2017-09-13 MED ORDER — PROMETHAZINE HCL 25 MG PO TABS: 25.0000 mg | ORAL_TABLET | Freq: Four times a day (QID) | ORAL | 0 refills | Status: DC | PRN

## 2017-09-13 MED ORDER — RANITIDINE HCL 150 MG PO CAPS: 150.0000 mg | ORAL_CAPSULE | Freq: Two times a day (BID) | ORAL | 0 refills | Status: DC

## 2017-09-13 MED ORDER — PROMETHAZINE HCL 25 MG RE SUPP: 25.0000 mg | Freq: Three times a day (TID) | RECTAL | 0 refills | Status: DC | PRN

## 2017-09-13 NOTE — MAU Note (Signed)
Pt presents to MAU with c/o of abdominal pain and vomiting that started Sunday. Pt has had chest pain today.

## 2017-09-13 NOTE — Discharge Instructions (Signed)
Drink liquids - small amounts every hour and work up to 64 ounces per day. Add soft foods or crackers and advance to solid foods slowly.  Do not eat fried foods or high fat foods. Get your medicines from the pharmacy and use the phenergan tablets and zantac and reglan to control the vomiting and the stomach acid. Be seen in the office at your appointment.  Call the office if your symptoms are worsening. You can use glycerine suppositories or a Fleet enema to have a BM. You do not have ketones in your urine so you are keeping some fluids down. Use the Robinul forte to control the spitting or ptyalism.

## 2017-09-13 NOTE — MAU Provider Note (Signed)
History     CSN: [id number]  Arrival date and time: 09/13/17 1706   First Provider Initiated Contact with Patient 09/13/17 1736      Chief Complaint  Patient presents with  . Chest Pain  . Emesis  . Abdominal Pain   HPI Phyllis Dalton 27 y.o. [redacted]w[redacted]d  Comes to MAU with repetitive vomiting.  Was seen in MAU on 09-07-17 for similar symptoms.  Has diclegis, phenergan and zofran but is still vomiging.  Has not had a BM in the past week.  She reports vomiting up everything she eats for the past 2 days.  Three days ago she ate chicken strips and fries and did not vomit.  Today the vomiting started at 3 am.  She took Zofran at 3 am today and again in 8 hours. It has not helped.  She has had vomiting all day.  She is out of phenergan tablets and has not used the diclegis in the past few days.   OB History    Gravida Para Term Preterm AB Living   3 2 2  0 0 2   SAB TAB Ectopic Multiple Live Births   0 0 0 0 2      Past Medical History:  Diagnosis Date  . Depression   . Diabetes mellitus without complication ([place])   . Obesity   . Panic attack   . Trichomonal vaginitis     Past Surgical History:  Procedure Laterality Date  . MOUTH SURGERY    . TONSILLECTOMY AND ADENOIDECTOMY      Family History  Problem Relation Age of Onset  . Diabetes Mother   . HIV Mother   . Diabetes Other     Social History   Tobacco Use  . Smoking status: Former Smoker    Types: Cigarettes    Last attempt to quit: 12/25/2012    Years since quitting: 4.7  . Smokeless tobacco: Never Used  Substance Use Topics  . Alcohol use: No    Comment: rare  . Drug use: No    Comment: used 09/04/17    Allergies: No Known Allergies  Medications Prior to Admission  Medication Sig Dispense Refill Last Dose  . ondansetron (ZOFRAN ODT) 4 MG disintegrating tablet Take 1 tablet (4 mg total) by mouth every 8 (eight) hours as needed for nausea or vomiting. 15 tablet 0     Review of Systems  Constitutional:  Negative for fever.  Gastrointestinal: Positive for constipation, nausea and vomiting.       Pain and burning in throat, blood in vomitus  Genitourinary: Negative for dysuria, vaginal bleeding and vaginal discharge.   Physical Exam   Blood pressure (!) 100/56, pulse (!) 119, temperature 99.5 F (37.5 C), temperature source Oral, resp. rate 18, height 5\' 6"  (1.676 m), weight 253 lb (114.8 kg), last menstrual period 06/29/2017.  Physical Exam  Nursing note and vitals reviewed. Constitutional: She is oriented to person, place, and time. She appears well-developed and well-nourished.  Pacing in room and then leaning over the bed.  HENT:  Head: Normocephalic.  Eyes: EOM are normal.  Neck: Neck supple.  GI: Soft. There is no tenderness. There is no rebound and no guarding.  Bowel sounds in all 4 quadrants, but decreased in LUQ and LLQ.  Entire abdomen soft.  Has some pain in LUQ which radiates to her back.  Musculoskeletal: Normal range of motion.  Neurological: She is alert and oriented to person, place, and time.  Skin: Skin is  warm and dry.  Psychiatric: She has a normal mood and affect.    MAU Course  Procedures Results for orders placed or performed during the hospital encounter of 09/13/17 (from the past 24 hour(s))  Urinalysis, Routine w reflex microscopic     Status: Abnormal   Collection Time: 09/13/17  5:16 PM  Result Value Ref Range   Color, Urine AMBER (A) YELLOW   APPearance CLOUDY (A) CLEAR   Specific Gravity, Urine 1.028 1.005 - 1.030   pH 7.0 5.0 - 8.0   Glucose, UA NEGATIVE NEGATIVE mg/dL   Hgb urine dipstick NEGATIVE NEGATIVE   Bilirubin Urine NEGATIVE NEGATIVE   Ketones, ur NEGATIVE NEGATIVE mg/dL   Protein, ur 100 (A) NEGATIVE mg/dL   Nitrite NEGATIVE NEGATIVE   Leukocytes, UA TRACE (A) NEGATIVE   RBC / HPF 0-5 0 - 5 RBC/hpf   WBC, UA 6-30 0 - 5 WBC/hpf   Bacteria, UA RARE (A) NONE SEEN   Squamous Epithelial / LPF TOO NUMEROUS TO COUNT (A) NONE SEEN    Mucus PRESENT   CBC     Status: Abnormal   Collection Time: 09/13/17  6:06 PM  Result Value Ref Range   WBC 12.6 (H) 4.0 - 10.5 K/uL   RBC 4.93 3.87 - 5.11 MIL/uL   Hemoglobin 13.3 12.0 - 15.0 g/dL   HCT 39.7 36.0 - 46.0 %   MCV 80.5 78.0 - 100.0 fL   MCH 27.0 26.0 - 34.0 pg   MCHC 33.5 30.0 - 36.0 g/dL   RDW 13.1 11.5 - 15.5 %   Platelets 462 (H) 150 - 400 K/uL  Comprehensive metabolic panel     Status: Abnormal   Collection Time: 09/13/17  6:06 PM  Result Value Ref Range   Sodium 134 (L) 135 - 145 mmol/L   Potassium 4.2 3.5 - 5.1 mmol/L   Chloride 99 (L) 101 - 111 mmol/L   CO2 25 22 - 32 mmol/L   Glucose, Bld 99 65 - 99 mg/dL   BUN 8 6 - 20 mg/dL   Creatinine, Ser 0.65 0.44 - 1.00 mg/dL   Calcium 9.5 8.9 - 10.3 mg/dL   Total Protein 7.5 6.5 - 8.1 g/dL   Albumin 3.9 3.5 - 5.0 g/dL   AST 19 15 - 41 U/L   ALT 24 14 - 54 U/L   Alkaline Phosphatase 74 38 - 126 U/L   Total Bilirubin 0.8 0.3 - 1.2 mg/dL   GFR calc non Af Amer >60 >60 mL/min   GFR calc Af Amer >60 >60 mL/min   Anion gap 10 5 - 15    MDM Dr. Alwyn Pea requests MAU provider to see the patient.  Patient can is standing over the bed and moving in room.  Answering questions from nurse slowly.  Will order IVF with Phenergan for patient.  Client has spitting and reports vomiting at home to the point she has blood in the vomitus and burning.  There is no ketones in her urine.  Has not had any vomiting while in MAU.  States she is feeling better as the IVFwith Phenergan is infusing.  Discussed at length the importance of introducing foods back slowly (had been planning to have a burger tonight).  Discussed OTC glycerin suppositories or Fleet's enema to use for constipation.  Likely she has some abdominal pain from muscle strain for repetitive vomiting.  Her chest pain and blood in the vomitus is most likely form the repetitive vomiting.  Her vital signs are  stable and with the phenergan. With Phenergan infused client is much  more comfortable and calm.  Discussed her management plan at length.     Assessment and Plan  Ptyalism Nausea and vomiting in pregnancy Constipation - likely side effect of using Zofran  Plan Drink liquids - small amounts every hour and work up to 64 ounces per day. Add soft foods or crackers and advance to solid foods slowly.  Do not eat fried foods or high fat foods. Get your medicines from the pharmacy and use the phenergan tablets and zantac and reglan to control the vomiting and the stomach acid.  Do not expect the vomiting to be stopped completely. Be seen in the office at your appointment.  Call the office if your symptoms are worsening. You can use glycerine suppositories or a Fleet enema to have a BM. You do not have ketones in your urine so you are keeping some fluids down. Use the Robinul forte to control the spitting or ptyalism.   Phyllis Dalton 09/13/2017, 5:55 PM

## 2017-09-14 LAB — CULTURE, OB URINE

## 2017-09-18 ENCOUNTER — Encounter (HOSPITAL_COMMUNITY): Payer: Self-pay | Admitting: *Deleted

## 2017-09-18 ENCOUNTER — Other Ambulatory Visit: Payer: Self-pay

## 2017-09-18 ENCOUNTER — Inpatient Hospital Stay (HOSPITAL_COMMUNITY): Payer: Medicaid Other

## 2017-09-18 ENCOUNTER — Inpatient Hospital Stay (HOSPITAL_COMMUNITY)
Admission: AD | Admit: 2017-09-18 | Discharge: 2017-09-18 | Disposition: A | Discharge status: Home / Self Care | Payer: Medicaid Other | Source: Ambulatory Visit | Attending: Obstetrics & Gynecology | Admitting: Obstetrics & Gynecology

## 2017-09-18 DIAGNOSIS — R1010 Upper abdominal pain, unspecified: Secondary | ICD-10-CM

## 2017-09-18 DIAGNOSIS — R7989 Other specified abnormal findings of blood chemistry: Secondary | ICD-10-CM | POA: Insufficient documentation

## 2017-09-18 DIAGNOSIS — O219 Vomiting of pregnancy, unspecified: Secondary | ICD-10-CM | POA: Diagnosis not present

## 2017-09-18 DIAGNOSIS — R51 Headache: Secondary | ICD-10-CM | POA: Diagnosis not present

## 2017-09-18 DIAGNOSIS — Z87891 Personal history of nicotine dependence: Secondary | ICD-10-CM | POA: Diagnosis not present

## 2017-09-18 DIAGNOSIS — R945 Abnormal results of liver function studies: Secondary | ICD-10-CM

## 2017-09-18 DIAGNOSIS — O210 Mild hyperemesis gravidarum: Secondary | ICD-10-CM | POA: Diagnosis not present

## 2017-09-18 DIAGNOSIS — Z3A11 11 weeks gestation of pregnancy: Secondary | ICD-10-CM | POA: Insufficient documentation

## 2017-09-18 LAB — COMPREHENSIVE METABOLIC PANEL
ALT: 174 U/L — ABNORMAL HIGH (ref 14–54)
AST: 67 U/L — ABNORMAL HIGH (ref 15–41)
Albumin: 4.2 g/dL (ref 3.5–5.0)
Alkaline Phosphatase: 109 U/L (ref 38–126)
Anion gap: 12 (ref 5–15)
BUN: 7 mg/dL (ref 6–20)
CO2: 24 mmol/L (ref 22–32)
Calcium: 9.5 mg/dL (ref 8.9–10.3)
Chloride: 96 mmol/L — ABNORMAL LOW (ref 101–111)
Creatinine, Ser: 0.75 mg/dL (ref 0.44–1.00)
GFR calc Af Amer: >60 mL/min (ref >60)
GFR calc non Af Amer: >60 mL/min (ref >60)
Glucose, Bld: 111 mg/dL — ABNORMAL HIGH (ref 65–99)
Potassium: 3.7 mmol/L (ref 3.5–5.1)
Sodium: 132 mmol/L — ABNORMAL LOW (ref 135–145)
Total Bilirubin: 0.6 mg/dL (ref 0.3–1.2)
Total Protein: 8.5 g/dL — ABNORMAL HIGH (ref 6.5–8.1)

## 2017-09-18 LAB — CBC
HCT: 39.3 % (ref 36.0–46.0)
Hemoglobin: 13.2 g/dL (ref 12.0–15.0)
MCH: 26.9 pg (ref 26.0–34.0)
MCHC: 33.6 g/dL (ref 30.0–36.0)
MCV: 80 fL (ref 78.0–100.0)
Platelets: 476 10*3/uL — ABNORMAL HIGH (ref 150–400)
RBC: 4.91 MIL/uL (ref 3.87–5.11)
RDW: 12.9 % (ref 11.5–15.5)
WBC: 10.2 10*3/uL (ref 4.0–10.5)

## 2017-09-18 LAB — URINALYSIS, ROUTINE W REFLEX MICROSCOPIC
Bilirubin Urine: NEGATIVE
Glucose, UA: NEGATIVE mg/dL
Hgb urine dipstick: NEGATIVE
Ketones, ur: NEGATIVE mg/dL
Nitrite: NEGATIVE
Protein, ur: 100 mg/dL — AB
Specific Gravity, Urine: 1.024 (ref 1.005–1.030)
pH: 9 — ABNORMAL HIGH (ref 5.0–8.0)

## 2017-09-18 LAB — LIPASE, BLOOD: Lipase: 20 U/L (ref 11–51)

## 2017-09-18 MED ORDER — ONDANSETRON 8 MG PO TBDP: 8.0000 mg | ORAL_TABLET | Freq: Two times a day (BID) | ORAL | 0 refills | Status: DC

## 2017-09-18 MED ORDER — DEXTROSE IN LACTATED RINGERS 5 % IV SOLN
Freq: Once | INTRAVENOUS | Status: AC
  Administered 2017-09-18: 14:00:00 via INTRAVENOUS
  Filled 2017-09-18: qty 1000

## 2017-09-18 MED ORDER — ACETAMINOPHEN 650 MG RE SUPP
650.0000 mg | Freq: Once | RECTAL | Status: AC
  Administered 2017-09-18: 650 mg via RECTAL
  Filled 2017-09-18: qty 1

## 2017-09-18 MED ORDER — PROMETHAZINE HCL 25 MG/ML IJ SOLN
25.0000 mg | Freq: Once | INTRAVENOUS | Status: AC
  Administered 2017-09-18: 25 mg via INTRAVENOUS
  Filled 2017-09-18: qty 1

## 2017-09-18 NOTE — MAU Note (Signed)
Ongoing vomiting.  Can't keep anything down. Not keeping meds down, doing suppositories, they are not helping. Chest pain- burning.  Blood noted in emesis. Pain in RLQ, happens every time she throws up. Pt c/o feeling dizzy. (was laying down in lobby, assisted to rm in wc; so. Says this is what she does when she is hurting). Has a headache

## 2017-09-18 NOTE — MAU Note (Signed)
RN to the [facility] to assist pt. Up to BR for void.  Urine sample collected and sent to lab.

## 2017-09-18 NOTE — Discharge Instructions (Signed)
A prescription was sent to your pharmacy for additional Zofran - under the tongue tablets. It is very important for you to work on getting in fluids - food is not important at this time.  Liquids are needed for you to be better hydrated. Do not worry about eating solid foods except for bananas, rice, applesauce or toast and crackers. You can try Boost or Ensure or Gatorade. Drink at least 8 8-oz glasses of water every day. Be sure to call the office and see a provider while you are there on Wednesday since you have been seen here twice this week.

## 2017-09-18 NOTE — MAU Note (Addendum)
Per provider, Con Memos, NP:  OK to run vitamin bag at 999 ml/hr.  Pt. Currently asleep with SO at the [facility].

## 2017-09-18 NOTE — MAU Provider Note (Signed)
History     CSN: [id number]  Arrival date and time: 09/18/17 9381   First Provider Initiated Contact with Patient 09/18/17 1025      Chief Complaint  Patient presents with  . Abdominal Pain  . Emesis  . Chest Pain   HPI Phyllis Dalton 27 y.o. [redacted]w[redacted]d  Comes to MAU again with persistent vomiting despite using medications prescribed on her last visit on 09-13-17.  She has been vomiting since yesterday and cannot keep down her meds even when using the phenergan suppositories.  Is taking phenergan, robinul, and reglan all at the same time and then has vomiting. Has vomited yellow an brown stomach contents and then some vomitus has blood.  Has burning behind the sternum in the midline  Has pain under her left breast and then pain in the RUQ near the rib edge.  Has a headache and is feeling weak and shaky.  Has been trying juices and soups along with water and ice chips and reports she is not able to keep it down. Is distressed as she is not able to sleep.  Has not been to the office since her visit here.  Has an ultrasound scheduled on Wednesday.  OB History    Gravida Para Term Preterm AB Living   3 2 2  0 0 2   SAB TAB Ectopic Multiple Live Births   0 0 0 0 2      Past Medical History:  Diagnosis Date  . Depression   . Diabetes mellitus without complication ([place])   . Obesity   . Panic attack   . Trichomonal vaginitis     Past Surgical History:  Procedure Laterality Date  . MOUTH SURGERY    . TONSILLECTOMY    . TONSILLECTOMY AND ADENOIDECTOMY      Family History  Problem Relation Age of Onset  . Diabetes Mother   . HIV Mother   . Diabetes Other     Social History   Tobacco Use  . Smoking status: Former Smoker    Types: Cigarettes    Last attempt to quit: 12/25/2012    Years since quitting: 4.7  . Smokeless tobacco: Never Used  Substance Use Topics  . Alcohol use: No    Comment: rare  . Drug use: No    Comment: last couple wks ago    Allergies: No Known  Allergies  Medications Prior to Admission  Medication Sig Dispense Refill Last Dose  . glycopyrrolate (ROBINUL-FORTE) 2 MG tablet Take 1 tablet (2 mg total) by mouth 3 (three) times daily. 60 tablet 0   . metoCLOPramide (REGLAN) 10 MG tablet Take 1 tablet (10 mg total) by mouth 3 (three) times daily before meals. 90 tablet 0   . ondansetron (ZOFRAN ODT) 4 MG disintegrating tablet Take 1 tablet (4 mg total) by mouth every 8 (eight) hours as needed for nausea or vomiting. 15 tablet 0   . promethazine (PHENERGAN) 25 MG suppository Place 1 suppository (25 mg total) rectally every 8 (eight) hours as needed for nausea or vomiting. If oral meds are not helping. 12 each 0   . promethazine (PHENERGAN) 25 MG tablet Take 1 tablet (25 mg total) by mouth every 6 (six) hours as needed for nausea or vomiting. Also may use 1/2 tablet as needed. 30 tablet 0   . ranitidine (ZANTAC) 150 MG capsule Take 1 capsule (150 mg total) by mouth 2 (two) times daily. 60 capsule 0     Review of Systems  Constitutional: Positive for chills. Negative for fever.  Gastrointestinal: Positive for nausea and vomiting. Negative for diarrhea.       Abdominal pain in the upper abdomen Pain in chest behind the sternum  Genitourinary: Negative for vaginal bleeding and vaginal discharge.  Neurological: Positive for headaches.   Physical Exam   Blood pressure 127/84, pulse (!) 101, temperature 98.1 F (36.7 C), temperature source Oral, resp. rate 20, last menstrual period 06/29/2017, SpO2 100 %.  Physical Exam  Nursing note and vitals reviewed. Constitutional: She is oriented to person, place, and time. She appears well-developed and well-nourished.  Having chills then feeling hot Sitting up in bed  HENT:  Head: Normocephalic.  Eyes: EOM are normal.  Neck: Neck supple.  GI: Soft. There is tenderness. There is no rebound and no guarding.  tenderness under ribs bilaterally, Feels tenderness along the sides of her abdomen also -  thinks it is muscle strain from vomiting.  Musculoskeletal: Normal range of motion.  Trembling noted in hands bilaterally  Neurological: She is alert and oriented to person, place, and time.  Skin: Skin is warm and dry.  Psychiatric: She has a normal mood and affect.    MAU Course  Procedures Results for orders placed or performed during the hospital encounter of 09/18/17 (from the past 24 hour(s))  CBC     Status: Abnormal   Collection Time: 09/18/17 10:59 AM  Result Value Ref Range   WBC 10.2 4.0 - 10.5 K/uL   RBC 4.91 3.87 - 5.11 MIL/uL   Hemoglobin 13.2 12.0 - 15.0 g/dL   HCT 39.3 36.0 - 46.0 %   MCV 80.0 78.0 - 100.0 fL   MCH 26.9 26.0 - 34.0 pg   MCHC 33.6 30.0 - 36.0 g/dL   RDW 12.9 11.5 - 15.5 %   Platelets 476 (H) 150 - 400 K/uL  Comprehensive metabolic panel     Status: Abnormal   Collection Time: 09/18/17 10:59 AM  Result Value Ref Range   Sodium 132 (L) 135 - 145 mmol/L   Potassium 3.7 3.5 - 5.1 mmol/L   Chloride 96 (L) 101 - 111 mmol/L   CO2 24 22 - 32 mmol/L   Glucose, Bld 111 (H) 65 - 99 mg/dL   BUN 7 6 - 20 mg/dL   Creatinine, Ser 0.75 0.44 - 1.00 mg/dL   Calcium 9.5 8.9 - 10.3 mg/dL   Total Protein 8.5 (H) 6.5 - 8.1 g/dL   Albumin 4.2 3.5 - 5.0 g/dL   AST 67 (H) 15 - 41 U/L   ALT 174 (H) 14 - 54 U/L   Alkaline Phosphatase 109 38 - 126 U/L   Total Bilirubin 0.6 0.3 - 1.2 mg/dL   GFR calc non Af Amer >60 >60 mL/min   GFR calc Af Amer >60 >60 mL/min   Anion gap 12 5 - 15  Lipase, blood     Status: None   Collection Time: 09/18/17 10:59 AM  Result Value Ref Range   Lipase 20 11 - 51 U/L  Urinalysis, Routine w reflex microscopic     Status: Abnormal   Collection Time: 09/18/17 12:45 PM  Result Value Ref Range   Color, Urine AMBER (A) YELLOW   APPearance CLOUDY (A) CLEAR   Specific Gravity, Urine 1.024 1.005 - 1.030   pH 9.0 (H) 5.0 - 8.0   Glucose, UA NEGATIVE NEGATIVE mg/dL   Hgb urine dipstick NEGATIVE NEGATIVE   Bilirubin Urine NEGATIVE NEGATIVE    Ketones, ur NEGATIVE  NEGATIVE mg/dL   Protein, ur 100 (A) NEGATIVE mg/dL   Nitrite NEGATIVE NEGATIVE   Leukocytes, UA TRACE (A) NEGATIVE   RBC / HPF 0-5 0 - 5 RBC/hpf   WBC, UA 6-30 0 - 5 WBC/hpf   Bacteria, UA RARE (A) NONE SEEN   Squamous Epithelial / LPF TOO NUMEROUS TO COUNT (A) NONE SEEN   Mucus PRESENT    CLINICAL DATA:  Eleven weeks pregnant. Right upper quadrant pain. Elevated LFTs.  EXAM: ULTRASOUND ABDOMEN LIMITED RIGHT UPPER QUADRANT  COMPARISON:  01/26/2017  FINDINGS: Gallbladder:  Gallbladder has a normal appearance. Gallbladder wall is 1.7 millimeters, within normal limits. No stones or pericholecystic fluid. No sonographic Murphy's sign.  Common bile duct:  Diameter: 4 millimeters  Liver:  No focal lesion identified. Within normal limits in parenchymal echogenicity. Portal vein is patent on color Doppler imaging with normal direction of blood flow towards the liver.  IMPRESSION: Normal evaluation of right upper quadrant.  MDM Will give Phenergan in LR 1000cc IV, will give tylenol 650 mg by suppository.  Consulted with Dr. Alwyn Pea. Given multivitamins in 1000cc of D5LR as she seems to be dehydrated with her shakiness - client resting in bed and sleeping soundly through second bag of fluids. Right upper quadrant ultrasound as elevated liver function tests. Consulted with Dr. Alwyn Pea again and reviewed additional lab tests and results of RUQ [place].  Client able to take ice chips in MAU.  No vomiting while in MAU today.  Assessment and Plan  Nausea and vomiting in first trimester - third visit to MAU Headache Moderately elevated liver function - new since 09-01-17  Plan Reviewed medications with client - advised not to take all meds at one time but to take the phenergan or zofran and wait 30 mintues before taking the other meds.  Then try to eat some bland foods. A prescription was sent to your pharmacy for additional Zofran - under the tongue  tablets. It is very important for you to work on getting in fluids - food is not important at this time.  Liquids are needed for you to be better hydrated. Do not worry about eating solid foods except for bananas, rice, applesauce or toast and crackers. You can try Boost or Ensure or Gatorade. Drink at least 8 8-oz glasses of water every day. Be sure to call the office and see a provider while you are there on Wednesday since you have been seen here twice this week. Client in agreement with this plan.  Terri L Burleson 09/18/2017, 10:37 AM

## 2017-09-18 NOTE — MAU Note (Signed)
RN to the [facility] for evaluation; pt. Asleep, answers questions when asked.

## 2017-09-19 LAB — HEPATITIS PANEL, ACUTE
HCV Ab: <0.1 s/co ratio (ref 0.0–0.9)
Hep A IgM: NEGATIVE
Hep B C IgM: NEGATIVE
Hepatitis B Surface Ag: NEGATIVE

## 2017-09-20 ENCOUNTER — Encounter (HOSPITAL_COMMUNITY): Payer: Self-pay

## 2017-09-20 DIAGNOSIS — D75839 Thrombocytosis, unspecified: Secondary | ICD-10-CM

## 2017-09-20 HISTORY — DX: Thrombocytosis, unspecified: D75.839

## 2017-09-22 ENCOUNTER — Encounter: Payer: Self-pay | Admitting: Hematology

## 2017-09-22 ENCOUNTER — Telehealth: Payer: Self-pay | Admitting: Hematology

## 2017-09-22 NOTE — Telephone Encounter (Signed)
Received a call from [place] at [facility] to schedule the pt a hematology appt. Appt has been scheduled for the pt to see Dr. Irene Limbo on 3/4 at 10am. Letter mailed to the pt.

## 2017-10-15 ENCOUNTER — Emergency Department (HOSPITAL_COMMUNITY)
Admission: EM | Admit: 2017-10-15 | Discharge: 2017-10-15 | Disposition: A | Discharge status: Home / Self Care | Payer: Medicaid Other | Attending: Emergency Medicine | Admitting: Emergency Medicine

## 2017-10-15 ENCOUNTER — Encounter (HOSPITAL_COMMUNITY): Payer: Self-pay | Admitting: Emergency Medicine

## 2017-10-15 ENCOUNTER — Other Ambulatory Visit: Payer: Self-pay

## 2017-10-15 DIAGNOSIS — O24112 Pre-existing diabetes mellitus, type 2, in pregnancy, second trimester: Secondary | ICD-10-CM | POA: Diagnosis not present

## 2017-10-15 DIAGNOSIS — R197 Diarrhea, unspecified: Secondary | ICD-10-CM | POA: Diagnosis not present

## 2017-10-15 DIAGNOSIS — O9989 Other specified diseases and conditions complicating pregnancy, childbirth and the puerperium: Secondary | ICD-10-CM | POA: Diagnosis not present

## 2017-10-15 DIAGNOSIS — Z79899 Other long term (current) drug therapy: Secondary | ICD-10-CM | POA: Diagnosis not present

## 2017-10-15 DIAGNOSIS — E119 Type 2 diabetes mellitus without complications: Secondary | ICD-10-CM | POA: Insufficient documentation

## 2017-10-15 DIAGNOSIS — Z87891 Personal history of nicotine dependence: Secondary | ICD-10-CM | POA: Insufficient documentation

## 2017-10-15 DIAGNOSIS — R1013 Epigastric pain: Secondary | ICD-10-CM | POA: Diagnosis not present

## 2017-10-15 DIAGNOSIS — Z3A15 15 weeks gestation of pregnancy: Secondary | ICD-10-CM | POA: Diagnosis not present

## 2017-10-15 LAB — BASIC METABOLIC PANEL
Anion gap: 9 (ref 5–15)
BUN: <5 mg/dL — ABNORMAL LOW (ref 6–20)
CO2: 23 mmol/L (ref 22–32)
Calcium: 9.1 mg/dL (ref 8.9–10.3)
Chloride: 102 mmol/L (ref 101–111)
Creatinine, Ser: 0.72 mg/dL (ref 0.44–1.00)
GFR calc Af Amer: >60 mL/min (ref >60)
GFR calc non Af Amer: >60 mL/min (ref >60)
Glucose, Bld: 95 mg/dL (ref 65–99)
Potassium: 3.8 mmol/L (ref 3.5–5.1)
Sodium: 134 mmol/L — ABNORMAL LOW (ref 135–145)

## 2017-10-15 LAB — CBC
HCT: 35.1 % — ABNORMAL LOW (ref 36.0–46.0)
Hemoglobin: 11.5 g/dL — ABNORMAL LOW (ref 12.0–15.0)
MCH: 26.7 pg (ref 26.0–34.0)
MCHC: 32.8 g/dL (ref 30.0–36.0)
MCV: 81.6 fL (ref 78.0–100.0)
Platelets: 433 10*3/uL — ABNORMAL HIGH (ref 150–400)
RBC: 4.3 MIL/uL (ref 3.87–5.11)
RDW: 13 % (ref 11.5–15.5)
WBC: 13.5 10*3/uL — ABNORMAL HIGH (ref 4.0–10.5)

## 2017-10-15 LAB — I-STAT TROPONIN, ED: Troponin i, poc: 0 ng/mL (ref 0.00–0.08)

## 2017-10-15 LAB — I-STAT BETA HCG BLOOD, ED (MC, WL, AP ONLY): I-stat hCG, quantitative: >2000 m[IU]/mL — ABNORMAL HIGH (ref <5)

## 2017-10-15 LAB — URINALYSIS, ROUTINE W REFLEX MICROSCOPIC
Bilirubin Urine: NEGATIVE
Glucose, UA: NEGATIVE mg/dL
Hgb urine dipstick: NEGATIVE
Ketones, ur: NEGATIVE mg/dL
Leukocytes, UA: NEGATIVE
Nitrite: NEGATIVE
Protein, ur: NEGATIVE mg/dL
Specific Gravity, Urine: 1.016 (ref 1.005–1.030)
pH: 8 (ref 5.0–8.0)

## 2017-10-15 MED ORDER — LIDOCAINE VISCOUS 2 % MT SOLN
5.0000 mL | Freq: Once | OROMUCOSAL | Status: AC
  Administered 2017-10-15: 5 mL via OROMUCOSAL
  Filled 2017-10-15: qty 15

## 2017-10-15 MED ORDER — FAMOTIDINE 20 MG IN NS 100 ML IVPB
20.0000 mg | Freq: Once | INTRAVENOUS | Status: AC
  Administered 2017-10-15: 20 mg via INTRAVENOUS
  Filled 2017-10-15: qty 100

## 2017-10-15 MED ORDER — LACTATED RINGERS IV BOLUS (SEPSIS)
1000.0000 mL | Freq: Once | INTRAVENOUS | Status: AC
  Administered 2017-10-15: 1000 mL via INTRAVENOUS

## 2017-10-15 MED ORDER — RANITIDINE HCL 150 MG PO TABS: 150.0000 mg | ORAL_TABLET | Freq: Two times a day (BID) | ORAL | 0 refills | Status: DC

## 2017-10-15 MED ORDER — ALUM & MAG HYDROXIDE-SIMETH 200-200-20 MG/5ML PO SUSP
15.0000 mL | Freq: Once | ORAL | Status: AC
  Administered 2017-10-15: 15 mL via ORAL
  Filled 2017-10-15: qty 30

## 2017-10-15 MED ORDER — ACETAMINOPHEN 325 MG PO TABS
650.0000 mg | ORAL_TABLET | Freq: Once | ORAL | Status: AC
  Administered 2017-10-15: 650 mg via ORAL
  Filled 2017-10-15: qty 2

## 2017-10-15 NOTE — ED Notes (Signed)
Pharmacy notified of need for pepcid. Pharmacy notified which tube stations to send to.

## 2017-10-15 NOTE — Discharge Instructions (Signed)
Call your OB for follow up. If you have worsening symptoms go to [facility].

## 2017-10-15 NOTE — ED Provider Notes (Signed)
[facility] EMERGENCY DEPARTMENT Provider Note   CSN: [id number] Arrival date & time: 10/15/17  1622     History   Chief Complaint Chief Complaint  Patient presents with  . Chest Pain    HPI Phyllis Dalton is a 27 y.o. [id number] @ [redacted]w[redacted]d  gestation by LMP who presents to the ED with left side pain that woke her from sleep approximately 1 hours prior to arrival to the ED. Patient reports pain decreases when she applies pressure. Patient states that she at at [facility] and went home and went to sleep but work due to pain in her epigastric and under left breast. Patient with hx of frequent heart burn. Patient did not take any medication for the symptoms. Patient reports that since arrival to the ED she feels fine. Patient states with last episode of diarrhea she saw a streak of bright red blood.  HPI  Past Medical History:  Diagnosis Date  . Depression   . Diabetes mellitus without complication ([place])   . Obesity   . Panic attack   . Trichomonal vaginitis     Patient Active Problem List   Diagnosis Date Noted  . Major depressive disorder, single episode, severe, without mention of psychotic behavior--hospitalized during pregnancy at [facility] 03/21/2013  . Panic attack     Past Surgical History:  Procedure Laterality Date  . MOUTH SURGERY    . TONSILLECTOMY    . TONSILLECTOMY AND ADENOIDECTOMY      OB History    Gravida Para Term Preterm AB Living   3 2 2  0 0 2   SAB TAB Ectopic Multiple Live Births   0 0 0 0 2       Home Medications    Prior to Admission medications   Medication Sig Start Date End Date Taking? Authorizing Provider  glycopyrrolate (ROBINUL-FORTE) 2 MG tablet Take 1 tablet (2 mg total) by mouth 3 (three) times daily. 09/13/17   Burleson, Rona Ravens, NP  metoCLOPramide (REGLAN) 10 MG tablet Take 1 tablet (10 mg total) by mouth 3 (three) times daily before meals. 09/13/17   Burleson, Rona Ravens, NP  ondansetron (ZOFRAN ODT) 4 MG disintegrating  tablet Take 1 tablet (4 mg total) by mouth every 8 (eight) hours as needed for nausea or vomiting. 09/07/17   Jorje Guild, NP  ondansetron (ZOFRAN ODT) 8 MG disintegrating tablet Take 1 tablet (8 mg total) by mouth 2 (two) times daily. 09/18/17   Burleson, Rona Ravens, NP  promethazine (PHENERGAN) 25 MG suppository Place 1 suppository (25 mg total) rectally every 8 (eight) hours as needed for nausea or vomiting. If oral meds are not helping. 09/13/17   Burleson, Rona Ravens, NP  promethazine (PHENERGAN) 25 MG tablet Take 1 tablet (25 mg total) by mouth every 6 (six) hours as needed for nausea or vomiting. Also may use 1/2 tablet as needed. 09/13/17   Burleson, Rona Ravens, NP  ranitidine (ZANTAC) 150 MG tablet Take 1 tablet (150 mg total) by mouth 2 (two) times daily. 10/15/17   Ashley Murrain, NP    Family History Family History  Problem Relation Age of Onset  . Diabetes Mother   . HIV Mother   . Diabetes Other     Social History Social History   Tobacco Use  . Smoking status: Former Smoker    Types: Cigarettes    Last attempt to quit: 12/25/2012    Years since quitting: 4.8  . Smokeless tobacco: Never Used  Substance Use Topics  . Alcohol use: No    Comment: rare  . Drug use: No    Comment: last couple wks ago     Allergies   Patient has no known allergies.   Review of Systems Review of Systems  Constitutional: Positive for chills. Negative for fever.  HENT: Negative.   Eyes: Negative for visual disturbance.  Respiratory: Negative for cough and shortness of breath.   Cardiovascular: Chest pain: that resolved.  Gastrointestinal: Positive for diarrhea. Negative for nausea and vomiting. Abdominal pain: epigastric.  Genitourinary: Negative for dysuria, frequency, urgency and vaginal bleeding.  Musculoskeletal: Negative for back pain.  Neurological: Positive for headaches. Negative for syncope and weakness.  Psychiatric/Behavioral: Negative for confusion.     Physical Exam Updated  Vital Signs BP (!) 120/56 (BP Location: Right Arm)   Pulse 99   Temp 98.9 F (37.2 C) (Oral)   Resp 18   Ht 5\' 9"  (1.753 m)   Wt 112.5 kg (248 lb)   LMP 06/29/2017   SpO2 100%   BMI 36.62 kg/m   Physical Exam  Constitutional: No distress.  obese  HENT:  Head: Normocephalic and atraumatic.  Eyes: EOM are normal.  Neck: Normal range of motion. Neck supple.  Cardiovascular: Normal rate and regular rhythm.  Pulmonary/Chest: Effort normal and breath sounds normal.  Abdominal: Soft. Bowel sounds are normal. There is tenderness in the epigastric area. There is no CVA tenderness.  Doppler FHT's 156  Genitourinary:  Genitourinary Comments: External genitalia without lesions, white d/c vaginal vault. Cervix long, closed, no CMT, uterus approximately 14 week size.   Musculoskeletal: Normal range of motion.  Neurological: She is alert.  Skin: Skin is warm and dry.  Psychiatric: She has a normal mood and affect.  Nursing note and vitals reviewed.    ED Treatments / Results  Labs (all labs ordered are listed, but only abnormal results are displayed) Labs Reviewed  BASIC METABOLIC PANEL - Abnormal; Notable for the following components:      Result Value   Sodium 134 (*)    BUN <5 (*)    All other components within normal limits  CBC - Abnormal; Notable for the following components:   WBC 13.5 (*)    Hemoglobin 11.5 (*)    HCT 35.1 (*)    Platelets 433 (*)    All other components within normal limits  URINALYSIS, ROUTINE W REFLEX MICROSCOPIC - Abnormal; Notable for the following components:   APPearance HAZY (*)    All other components within normal limits  I-STAT BETA HCG BLOOD, ED ([facility], [facility], [facility] ONLY) - Abnormal; Notable for the following components:   I-stat hCG, quantitative >2,000.0 (*)    All other components within normal limits  I-STAT TROPONIN, ED    Radiology No results found.  Procedures Procedures (including critical care time)  Medications Ordered in  ED Medications  famotidine (PEPCID) IVPB 20 mg in NS 100 mL IVPB (20 mg Intravenous Given 10/15/17 2053)  alum & mag hydroxide-simeth (MAALOX/MYLANTA) 200-200-20 MG/5ML suspension 15 mL (15 mLs Oral Given 10/15/17 1847)  lidocaine (XYLOCAINE) 2 % viscous mouth solution 5 mL (5 mLs Mouth/Throat Given 10/15/17 1847)  lactated ringers bolus 1,000 mL (0 mLs Intravenous Stopped 10/15/17 2020)  acetaminophen (TYLENOL) tablet 650 mg (650 mg Oral Given 10/15/17 1919)  8:45 pm patient reports that after IV fluids and medications all her pain and symptoms have resolved and she is ready to go home.  8:55 pm consult with patient's  CNM at [facility], CNM and she will see patient for f/u in the office.  Initial Impression / Assessment and Plan / ED Course  I have reviewed the triage vital signs and the nursing notes. 27 y.o. [id number] here with epigastric pain that started after eating at [facility]. Stable for d/c without abnormal cardiac  Findings. Will treat for epigastric pain and patient will f/u with her OB. Patient also with diarrhea. Discussed diet for diarrhea.  Final Clinical Impressions(s) / ED Diagnoses   Final diagnoses:  Acute epigastric pain  Diarrhea, unspecified type    ED Discharge Orders        Ordered    ranitidine (ZANTAC) 150 MG tablet  2 times daily     10/15/17 2101       Debroah Baller [place], [place] 10/15/17 2109    Dorie Rank, MD 10/15/17 2215

## 2017-10-15 NOTE — ED Notes (Signed)
Pt understood dc material. NAD noted. 

## 2017-10-15 NOTE — ED Triage Notes (Addendum)
Pt arrived [facility] with reports of sharp pain on the left side that started about 1 hour ago that woke her up from her sleep, better with putting pressure on it.  12lead shows ST, 105 BP 109/66, CBG110, 98% 20LAC Pt states she is 4 months pregnant EDD 04/18/18

## 2017-10-15 NOTE — ED Notes (Signed)
ED Provider at bedside. 

## 2017-10-23 ENCOUNTER — Encounter: Payer: Medicaid Other | Admitting: Hematology

## 2017-10-30 NOTE — Progress Notes (Signed)
HEMATOLOGY/ONCOLOGY CONSULTATION NOTE  Date of Service: 11/01/2017  Patient Care Team: Patient, No Pcp Per as PCP - General (General Practice)  CHIEF COMPLAINTS/PURPOSE OF CONSULTATION:  Thrombocytosis  HISTORY OF PRESENTING ILLNESS:   Phyllis Dalton is a wonderful 27 y.o. female who has been referred to [place] by Donnel Saxon, CNM for evaluation and management of thrombocytosis. She notes that she is [redacted] weeks pregnant and that her two children are 78 and 2 y/o. This is her third pregnancy and both of her two previous pregnancies went full term without complications. She is accompanied today by four members of her family. The pt reports that she is doing well overall.   The pt reports rectal bleeding during January and February 2019. She was told that it was likely her hemorrhoids. She notes that the bleeding has stopped.  She noted that she had thrown up bright red blood in January when she presented to the ED. She also had an Abdomen US that revealed no changes in her gallbladder.   She notes having had bouts of diarrhea and constipation. She notes significant nausea and has been prescribed Zofran and has not taken this very frequently.  She notes headaches and acid reflux, for which she was told to take Prilosec. She notes that she has not wanted to take Prilosec because she doesn't like taking pills.  She denies having any stomach ulcers.  She notes that she has not taken her pre-natal vitamins.   She reported that her periods before pregnancy were normal and lasted 7 days. She denies having had blood transfusions, or IV iron.    Most recent lab results (10/15/17) of CBC  is as follows: all values are WNL except for WBC at 13.5k, Hgb at 11.5, HCT at 35.1, Platelets at 433k. Her 09/18/17 CMP showed Sodium at 132, Chloride, at 96, Glucose at 111, Total Protein at 8.5, AST at 67, ALT at 174.   On review of systems, pt reports headaches, acid reflux, nausea, intermittent diarrhea and  constipation, and denies any other symptoms.   On PMHx the pt reports major depressive disorder, sciatica. She denies thyroid problems. On Social Hx the pt reports a history of smoking and ETOH use.   MEDICAL HISTORY:  Past Medical History:  Diagnosis Date  . Depression   . Diabetes mellitus without complication ([place])   . Obesity   . Panic attack   . Trichomonal vaginitis     SURGICAL HISTORY: Past Surgical History:  Procedure Laterality Date  . MOUTH SURGERY    . TONSILLECTOMY    . TONSILLECTOMY AND ADENOIDECTOMY      SOCIAL HISTORY: Social History   Socioeconomic History  . Marital status: Single    Spouse name: Not on file  . Number of children: Not on file  . Years of education: Not on file  . Highest education level: Not on file  Social Needs  . Financial resource strain: Not on file  . Food insecurity - worry: Not on file  . Food insecurity - inability: Not on file  . Transportation needs - medical: Not on file  . Transportation needs - non-medical: Not on file  Occupational History  . Not on file  Tobacco Use  . Smoking status: Former Smoker    Types: Cigarettes    Last attempt to quit: 12/25/2012    Years since quitting: 4.8  . Smokeless tobacco: Never Used  Substance and Sexual Activity  . Alcohol use: No  Comment: rare  . Drug use: No    Comment: last couple wks ago  . Sexual activity: Yes  Other Topics Concern  . Not on file  Social History Narrative  . Not on file    FAMILY HISTORY: Family History  Problem Relation Age of Onset  . Diabetes Mother   . HIV Mother   . Diabetes Other     ALLERGIES:  has No Known Allergies.  MEDICATIONS:  Current Outpatient Medications  Medication Sig Dispense Refill  . glycopyrrolate (ROBINUL-FORTE) 2 MG tablet Take 1 tablet (2 mg total) by mouth 3 (three) times daily. 60 tablet 0  . metoCLOPramide (REGLAN) 10 MG tablet Take 1 tablet (10 mg total) by mouth 3 (three) times daily before meals. 90 tablet 0   . ondansetron (ZOFRAN ODT) 4 MG disintegrating tablet Take 1 tablet (4 mg total) by mouth every 8 (eight) hours as needed for nausea or vomiting. 15 tablet 0  . ondansetron (ZOFRAN ODT) 8 MG disintegrating tablet Take 1 tablet (8 mg total) by mouth 2 (two) times daily. 15 tablet 0  . promethazine (PHENERGAN) 25 MG suppository Place 1 suppository (25 mg total) rectally every 8 (eight) hours as needed for nausea or vomiting. If oral meds are not helping. 12 each 0  . promethazine (PHENERGAN) 25 MG tablet Take 1 tablet (25 mg total) by mouth every 6 (six) hours as needed for nausea or vomiting. Also may use 1/2 tablet as needed. 30 tablet 0  . ranitidine (ZANTAC) 150 MG tablet Take 1 tablet (150 mg total) by mouth 2 (two) times daily. 60 tablet 0   No current facility-administered medications for this visit.     REVIEW OF SYSTEMS:    10 Point review of Systems was done is negative except as noted above.  PHYSICAL EXAMINATION:  . Vitals:   11/01/17 1456  BP: 115/67  Pulse: (!) 103  Resp: 20  Temp: 98.7 F (37.1 C)  SpO2: 100%   Filed Weights   11/01/17 1456  Weight: 258 lb 12.8 oz (117.4 kg)   .Body mass index is 38.22 kg/m.  GENERAL:alert, in no acute distress and comfortable SKIN: no acute rashes, no significant lesions EYES: conjunctiva are pink and non-injected, sclera anicteric OROPHARYNX: MMM, no exudates, no oropharyngeal erythema or ulceration NECK: supple, no JVD LYMPH:  no palpable lymphadenopathy in the cervical, axillary or inguinal regions LUNGS: clear to auscultation b/l with normal respiratory effort HEART: regular rate & rhythm ABDOMEN:  normoactive bowel sounds , non tender, not distended. Extremity: no pedal edema PSYCH: alert & oriented x 3 with fluent speech NEURO: no focal motor/sensory deficits  LABORATORY DATA:  I have reviewed the data as listed  . CBC Latest Ref Rng & Units 11/01/2017 10/15/2017 09/18/2017  WBC 3.9 - 10.3 K/uL 12.0(H) 13.5(H) 10.2   Hemoglobin 12.0 - 15.0 g/dL - 11.5(L) 13.2  Hematocrit 34.8 - 46.6 % 35.0 35.1(L) 39.3  Platelets 145 - 400 K/uL 410(H) 433(H) 476(H)  HGB 11.4  . CBC    Component Value Date/Time   WBC 12.0 (H) 11/01/2017 1609   WBC 13.5 (H) 10/15/2017 1700   RBC 4.22 11/01/2017 1609   RBC 4.22 11/01/2017 1609   HGB 11.5 (L) 10/15/2017 1700   HCT 35.0 11/01/2017 1609   PLT 410 (H) 11/01/2017 1609   MCV 82.9 11/01/2017 1609   MCH 27.0 11/01/2017 1609   MCHC 32.6 11/01/2017 1609   RDW 13.6 11/01/2017 1609   LYMPHSABS 1.9 11/01/2017 1609  MONOABS 0.6 11/01/2017 1609   EOSABS 0.1 11/01/2017 1609   BASOSABS 0.0 11/01/2017 1609     . CMP Latest Ref Rng & Units 11/01/2017 10/15/2017 09/18/2017  Glucose 70 - 140 mg/dL 101 95 111(H)  BUN 7 - 26 mg/dL 6(L) <5(L) 7  Creatinine 0.60 - 1.10 mg/dL 0.82 0.72 0.75  Sodium 136 - 145 mmol/L 136 134(L) 132(L)  Potassium 3.5 - 5.1 mmol/L 3.7 3.8 3.7  Chloride 98 - 109 mmol/L 102 102 96(L)  CO2 22 - 29 mmol/L 26 23 24   Calcium 8.4 - 10.4 mg/dL 9.7 9.1 9.5  Total Protein 6.4 - 8.3 g/dL 7.5 - 8.5(H)  Total Bilirubin 0.2 - 1.2 mg/dL 0.2 - 0.6  Alkaline Phos 40 - 150 U/L 90 - 109  AST 5 - 34 U/L 15 - 67(H)  ALT 0 - 55 U/L 17 - 174(H)   . Lab Results  Component Value Date   IRON 59 11/01/2017   TIBC 315 11/01/2017   IRONPCTSAT 19 (L) 11/01/2017   (Iron and TIBC)  Lab Results  Component Value Date   FERRITIN 22 11/01/2017     RADIOGRAPHIC STUDIES: I have personally reviewed the radiological images as listed and agreed with the findings in the report. No results found.  ASSESSMENT & PLAN:  27 y.o. female with  1. Thrombocytosis  Likely reactive from iron deficiency vs other stressors vs obesity Platelet counts nearly normalized at 410k PLAN -given patients age low likelihood of Myeloproliferative neoplasm -would recommend Iron replacement to maintain ferritin >50 -would recommend Iron polysaccharide 150mg po BID -Will send testing to r/o  clonal thrombocytosis though less likely. -f/u with ObGyn to mx nausea/vomiting  Labs today RTC with Dr Irene Limbo as needed based on labs   All of the patients questions were answered with apparent satisfaction. The patient knows to call the clinic with any problems, questions or concerns.  I spent 30 minutes counseling the patient face to face. The total time spent in the appointment was 35 minutes and more than 50% was on counseling and direct patient cares.    Sullivan Lone MD MS AAHIVMS [facility] Hematology/Oncology Physician [facility]  (Office):       [phone] (Work cell):  [phone] (Fax):           [phone]  11/01/2017 3:09 PM  This document serves as a record of services personally performed by Sullivan Lone, MD. It was created on his behalf by Baldwin Jamaica, a trained medical scribe. The creation of this record is based on the scribe's personal observations and the provider's statements to them.   .I have reviewed the above documentation for accuracy and completeness, and I agree with the above. Brunetta Genera MD MS

## 2017-11-01 ENCOUNTER — Encounter: Payer: Self-pay | Admitting: Hematology

## 2017-11-01 ENCOUNTER — Inpatient Hospital Stay: Payer: Medicaid Other

## 2017-11-01 ENCOUNTER — Inpatient Hospital Stay: Payer: Medicaid Other | Attending: Hematology | Admitting: Hematology

## 2017-11-01 VITALS — BP 115/67 | HR 103 | Temp 98.7°F | Resp 20 | Ht 69.0 in | Wt 258.8 lb

## 2017-11-01 DIAGNOSIS — K625 Hemorrhage of anus and rectum: Secondary | ICD-10-CM | POA: Insufficient documentation

## 2017-11-01 DIAGNOSIS — R51 Headache: Secondary | ICD-10-CM | POA: Diagnosis not present

## 2017-11-01 DIAGNOSIS — D473 Essential (hemorrhagic) thrombocythemia: Secondary | ICD-10-CM | POA: Diagnosis not present

## 2017-11-01 DIAGNOSIS — E119 Type 2 diabetes mellitus without complications: Secondary | ICD-10-CM | POA: Insufficient documentation

## 2017-11-01 DIAGNOSIS — Z79899 Other long term (current) drug therapy: Secondary | ICD-10-CM | POA: Insufficient documentation

## 2017-11-01 DIAGNOSIS — K219 Gastro-esophageal reflux disease without esophagitis: Secondary | ICD-10-CM | POA: Diagnosis not present

## 2017-11-01 DIAGNOSIS — Z87891 Personal history of nicotine dependence: Secondary | ICD-10-CM | POA: Diagnosis not present

## 2017-11-01 DIAGNOSIS — O99019 Anemia complicating pregnancy, unspecified trimester: Secondary | ICD-10-CM

## 2017-11-01 DIAGNOSIS — D75839 Thrombocytosis, unspecified: Secondary | ICD-10-CM

## 2017-11-01 DIAGNOSIS — K5900 Constipation, unspecified: Secondary | ICD-10-CM | POA: Diagnosis not present

## 2017-11-01 DIAGNOSIS — M5430 Sciatica, unspecified side: Secondary | ICD-10-CM | POA: Diagnosis not present

## 2017-11-01 DIAGNOSIS — D751 Secondary polycythemia: Secondary | ICD-10-CM | POA: Diagnosis present

## 2017-11-01 DIAGNOSIS — R197 Diarrhea, unspecified: Secondary | ICD-10-CM | POA: Diagnosis not present

## 2017-11-01 DIAGNOSIS — F329 Major depressive disorder, single episode, unspecified: Secondary | ICD-10-CM | POA: Insufficient documentation

## 2017-11-01 DIAGNOSIS — E669 Obesity, unspecified: Secondary | ICD-10-CM | POA: Insufficient documentation

## 2017-11-01 DIAGNOSIS — Z331 Pregnant state, incidental: Secondary | ICD-10-CM | POA: Diagnosis not present

## 2017-11-01 LAB — CBC WITH DIFFERENTIAL (CANCER CENTER ONLY)
Basophils Absolute: 0 10*3/uL (ref 0.0–0.1)
Basophils Relative: 0 %
Eosinophils Absolute: 0.1 10*3/uL (ref 0.0–0.5)
Eosinophils Relative: 1 %
HCT: 35 % (ref 34.8–46.6)
Hemoglobin: 11.4 g/dL — ABNORMAL LOW (ref 11.6–15.9)
Lymphocytes Relative: 16 %
Lymphs Abs: 1.9 10*3/uL (ref 0.9–3.3)
MCH: 27 pg (ref 25.1–34.0)
MCHC: 32.6 g/dL (ref 31.5–36.0)
MCV: 82.9 fL (ref 79.5–101.0)
Monocytes Absolute: 0.6 10*3/uL (ref 0.1–0.9)
Monocytes Relative: 5 %
Neutro Abs: 9.3 10*3/uL — ABNORMAL HIGH (ref 1.5–6.5)
Neutrophils Relative %: 78 %
Platelet Count: 410 10*3/uL — ABNORMAL HIGH (ref 145–400)
RBC: 4.22 MIL/uL (ref 3.70–5.45)
RDW: 13.6 % (ref 11.2–14.5)
WBC Count: 12 10*3/uL — ABNORMAL HIGH (ref 3.9–10.3)

## 2017-11-01 LAB — CMP (CANCER CENTER ONLY)
ALT: 17 U/L (ref 0–55)
AST: 15 U/L (ref 5–34)
Albumin: 3.2 g/dL — ABNORMAL LOW (ref 3.5–5.0)
Alkaline Phosphatase: 90 U/L (ref 40–150)
Anion gap: 8 (ref 3–11)
BUN: 6 mg/dL — ABNORMAL LOW (ref 7–26)
CO2: 26 mmol/L (ref 22–29)
Calcium: 9.7 mg/dL (ref 8.4–10.4)
Chloride: 102 mmol/L (ref 98–109)
Creatinine: 0.82 mg/dL (ref 0.60–1.10)
GFR, Est AFR Am: >60 mL/min (ref >60)
GFR, Estimated: >60 mL/min (ref >60)
Glucose, Bld: 101 mg/dL (ref 70–140)
Potassium: 3.7 mmol/L (ref 3.5–5.1)
Sodium: 136 mmol/L (ref 136–145)
Total Bilirubin: 0.2 mg/dL (ref 0.2–1.2)
Total Protein: 7.5 g/dL (ref 6.4–8.3)

## 2017-11-01 LAB — RETICULOCYTES
RBC.: 4.22 MIL/uL (ref 3.70–5.45)
Retic Count, Absolute: 76 10*3/uL (ref 33.7–90.7)
Retic Ct Pct: 1.8 % (ref 0.7–2.1)

## 2017-11-02 LAB — IRON AND TIBC
Iron: 59 ug/dL (ref 41–142)
Saturation Ratios: 19 % — ABNORMAL LOW (ref 21–57)
TIBC: 315 ug/dL (ref 236–444)
UIBC: 256 ug/dL

## 2017-11-02 LAB — FERRITIN: Ferritin: 22 ng/mL (ref 9–269)

## 2017-11-14 ENCOUNTER — Other Ambulatory Visit (HOSPITAL_COMMUNITY): Payer: Self-pay | Admitting: Obstetrics & Gynecology

## 2017-11-14 DIAGNOSIS — O99212 Obesity complicating pregnancy, second trimester: Secondary | ICD-10-CM

## 2017-11-14 DIAGNOSIS — Z3A20 20 weeks gestation of pregnancy: Secondary | ICD-10-CM

## 2017-11-14 DIAGNOSIS — Z3689 Encounter for other specified antenatal screening: Secondary | ICD-10-CM

## 2017-11-17 LAB — JAK2 (INCLUDING V617F AND EXON 12), MPL,& CALR-NEXT GEN SEQ

## 2017-11-24 ENCOUNTER — Encounter (HOSPITAL_COMMUNITY): Payer: Self-pay | Admitting: *Deleted

## 2017-11-28 ENCOUNTER — Encounter (HOSPITAL_COMMUNITY): Payer: Self-pay

## 2017-11-29 ENCOUNTER — Encounter (HOSPITAL_COMMUNITY): Payer: Self-pay

## 2017-11-29 ENCOUNTER — Other Ambulatory Visit (HOSPITAL_COMMUNITY): Payer: Self-pay | Admitting: *Deleted

## 2017-11-29 ENCOUNTER — Ambulatory Visit (HOSPITAL_COMMUNITY)
Admission: RE | Admit: 2017-11-29 | Discharge: 2017-11-29 | Disposition: A | Discharge status: Home / Self Care | Payer: Medicaid Other | Source: Ambulatory Visit | Attending: Obstetrics & Gynecology | Admitting: Obstetrics & Gynecology

## 2017-11-29 DIAGNOSIS — Z3A20 20 weeks gestation of pregnancy: Secondary | ICD-10-CM | POA: Diagnosis not present

## 2017-11-29 DIAGNOSIS — O09292 Supervision of pregnancy with other poor reproductive or obstetric history, second trimester: Secondary | ICD-10-CM | POA: Insufficient documentation

## 2017-11-29 DIAGNOSIS — O99212 Obesity complicating pregnancy, second trimester: Secondary | ICD-10-CM

## 2017-11-29 DIAGNOSIS — Z3689 Encounter for other specified antenatal screening: Secondary | ICD-10-CM

## 2017-11-29 DIAGNOSIS — Z362 Encounter for other antenatal screening follow-up: Secondary | ICD-10-CM

## 2017-11-29 HISTORY — DX: Essential (hemorrhagic) thrombocythemia: D47.3

## 2017-11-29 HISTORY — DX: Gestational diabetes mellitus in pregnancy, unspecified control: O24.419

## 2017-12-27 ENCOUNTER — Ambulatory Visit (HOSPITAL_COMMUNITY)
Admission: RE | Admit: 2017-12-27 | Discharge: 2017-12-27 | Disposition: A | Discharge status: Home / Self Care | Payer: Medicaid Other | Source: Ambulatory Visit | Attending: Obstetrics & Gynecology | Admitting: Obstetrics & Gynecology

## 2017-12-27 ENCOUNTER — Encounter (HOSPITAL_COMMUNITY): Payer: Self-pay

## 2017-12-27 ENCOUNTER — Other Ambulatory Visit (HOSPITAL_COMMUNITY): Payer: Self-pay | Admitting: Maternal and Fetal Medicine

## 2017-12-27 ENCOUNTER — Other Ambulatory Visit (HOSPITAL_COMMUNITY): Payer: Self-pay | Admitting: *Deleted

## 2017-12-27 DIAGNOSIS — O99212 Obesity complicating pregnancy, second trimester: Secondary | ICD-10-CM

## 2017-12-27 DIAGNOSIS — O09299 Supervision of pregnancy with other poor reproductive or obstetric history, unspecified trimester: Secondary | ICD-10-CM

## 2017-12-27 DIAGNOSIS — O09292 Supervision of pregnancy with other poor reproductive or obstetric history, second trimester: Secondary | ICD-10-CM | POA: Insufficient documentation

## 2017-12-27 DIAGNOSIS — Z3A24 24 weeks gestation of pregnancy: Secondary | ICD-10-CM | POA: Diagnosis not present

## 2017-12-27 DIAGNOSIS — Z362 Encounter for other antenatal screening follow-up: Secondary | ICD-10-CM

## 2017-12-27 NOTE — Addendum Note (Signed)
Encounter addended by: Abigail Butts, RDMS, RVT on: 12/27/2017 3:36 PM  Actions taken: Imaging Exam ended

## 2018-01-07 ENCOUNTER — Encounter (HOSPITAL_COMMUNITY): Payer: Self-pay | Admitting: *Deleted

## 2018-01-07 ENCOUNTER — Inpatient Hospital Stay (HOSPITAL_COMMUNITY)
Admission: AD | Admit: 2018-01-07 | Discharge: 2018-01-08 | Disposition: A | Discharge status: Home / Self Care | Payer: Medicaid Other | Source: Ambulatory Visit | Attending: Obstetrics & Gynecology | Admitting: Obstetrics & Gynecology

## 2018-01-07 DIAGNOSIS — Z87891 Personal history of nicotine dependence: Secondary | ICD-10-CM | POA: Diagnosis not present

## 2018-01-07 DIAGNOSIS — W108XXA Fall (on) (from) other stairs and steps, initial encounter: Secondary | ICD-10-CM

## 2018-01-07 DIAGNOSIS — Z830 Family history of human immunodeficiency virus [HIV] disease: Secondary | ICD-10-CM | POA: Insufficient documentation

## 2018-01-07 DIAGNOSIS — O99212 Obesity complicating pregnancy, second trimester: Secondary | ICD-10-CM | POA: Diagnosis not present

## 2018-01-07 DIAGNOSIS — E119 Type 2 diabetes mellitus without complications: Secondary | ICD-10-CM | POA: Diagnosis not present

## 2018-01-07 DIAGNOSIS — Z3A25 25 weeks gestation of pregnancy: Secondary | ICD-10-CM

## 2018-01-07 DIAGNOSIS — O24112 Pre-existing diabetes mellitus, type 2, in pregnancy, second trimester: Secondary | ICD-10-CM | POA: Diagnosis not present

## 2018-01-07 DIAGNOSIS — Z9889 Other specified postprocedural states: Secondary | ICD-10-CM | POA: Insufficient documentation

## 2018-01-07 DIAGNOSIS — O368120 Decreased fetal movements, second trimester, not applicable or unspecified: Secondary | ICD-10-CM | POA: Diagnosis not present

## 2018-01-07 DIAGNOSIS — R112 Nausea with vomiting, unspecified: Secondary | ICD-10-CM

## 2018-01-07 DIAGNOSIS — Z833 Family history of diabetes mellitus: Secondary | ICD-10-CM | POA: Insufficient documentation

## 2018-01-07 DIAGNOSIS — O26892 Other specified pregnancy related conditions, second trimester: Secondary | ICD-10-CM | POA: Insufficient documentation

## 2018-01-07 DIAGNOSIS — E669 Obesity, unspecified: Secondary | ICD-10-CM | POA: Diagnosis not present

## 2018-01-07 DIAGNOSIS — Z79899 Other long term (current) drug therapy: Secondary | ICD-10-CM | POA: Insufficient documentation

## 2018-01-07 DIAGNOSIS — R110 Nausea: Secondary | ICD-10-CM | POA: Insufficient documentation

## 2018-01-07 DIAGNOSIS — R51 Headache: Secondary | ICD-10-CM | POA: Insufficient documentation

## 2018-01-07 DIAGNOSIS — O99342 Other mental disorders complicating pregnancy, second trimester: Secondary | ICD-10-CM | POA: Insufficient documentation

## 2018-01-07 DIAGNOSIS — F329 Major depressive disorder, single episode, unspecified: Secondary | ICD-10-CM | POA: Insufficient documentation

## 2018-01-07 DIAGNOSIS — W109XXA Fall (on) (from) unspecified stairs and steps, initial encounter: Secondary | ICD-10-CM | POA: Insufficient documentation

## 2018-01-07 DIAGNOSIS — G44319 Acute post-traumatic headache, not intractable: Secondary | ICD-10-CM

## 2018-01-07 LAB — URINALYSIS, ROUTINE W REFLEX MICROSCOPIC
Bilirubin Urine: NEGATIVE
Glucose, UA: NEGATIVE mg/dL
Hgb urine dipstick: NEGATIVE
Ketones, ur: 15 mg/dL — AB
Leukocytes, UA: NEGATIVE
Nitrite: NEGATIVE
Protein, ur: 30 mg/dL — AB
Specific Gravity, Urine: 1.025 (ref 1.005–1.030)
pH: 6.5 (ref 5.0–8.0)

## 2018-01-07 LAB — URINALYSIS, MICROSCOPIC (REFLEX)

## 2018-01-07 MED ORDER — ACETAMINOPHEN 500 MG PO TABS
1000.0000 mg | ORAL_TABLET | Freq: Once | ORAL | Status: AC
  Administered 2018-01-08: 1000 mg via ORAL
  Filled 2018-01-07: qty 2

## 2018-01-07 MED ORDER — ONDANSETRON 8 MG PO TBDP
8.0000 mg | ORAL_TABLET | Freq: Once | ORAL | Status: AC
  Administered 2018-01-08: 8 mg via ORAL
  Filled 2018-01-07: qty 1

## 2018-01-07 NOTE — MAU Note (Signed)
Pt reports that she fell at 4 pm. [address] down 3 stairs. Fell on her L side and also hit her head. Pt took a nap after after her fall, woke up at 9pm, ate, then vomited. Ran out of Zofran and needs a refill. Pt reports no fetal movement since the fall. Denies cramping, bleeding or LOF.

## 2018-01-07 NOTE — MAU Note (Signed)
Pt states she fell around 4pm. States she was getting clothes out of the laundry room and was going upstairs and fell down the steps. States she fell down 3 steps. Pt states she has not felt baby move since the fall. FHT 153. Pt also thinks she hit her head. Has a HA. Has not taken anything for HA. Reports some ongoing nausea but this is normal for her. States she ran out of nausea medication.

## 2018-01-07 NOTE — MAU Provider Note (Signed)
History     CSN: [id number]  Arrival date and time: 01/07/18 2217   First Provider Initiated Contact with Patient 01/07/18 2335      Chief Complaint  Patient presents with  . Fall  . Decreased Fetal Movement   Lindie Spruce 26 yoAF, @ 25.4, [id number], with an EDD of 04/18/2018, presented to the MAU for s/p fall X8 hours ago. Pt stated at 4pm on 01/07/2018 she was walking down the stairs and she tripped over hanging laundry on the third to last step, pt landed hitting her left abdomen, left middle back and left side of head on the stairs. Pt had been feeling the baby move before the fall, but denies feeling the baby move after the fall. Pt endorses thinking a nap would help, fell asleep, woke at 9pm and vomited after eating food. Pt endorses being on 8mg  odt zofran BID, but ran out of meds, pt denies any other meds for nausea helping. Pt also endorses unable to drink anything other than dr pepper. Pt stated her head now is 10/10 and left side of abdomen and back are a 8/10. Pt denies taking any pain medication, denies blurred vision, leaking of fluids, no vaginal bleeding, no cramping that is abnormal for her. Pt stated when she was on the monitor here she began to feel the baby move again. Pt blood type is 0+.     Pregnancy Problems   Chlamydial infection; 01/02/2013; Dx at [facility]--Rx, offer partner treatment, notify [facility], check TOC after treatment (also had in 2014)  Thrombocytosis; 09/20/2017    Platelets 503 at [facility]--repeated at [facility] w/u, 508. Referred to hematology, seen 3/13--recommended iron replacement with iron polysaccharide 150 mg po BID, sent further testing for clonal thrombocytosis, but unlikely. F/u with Dr. Madaline Savage prn labs.   Depressive disorder    Hx severe episode during 2014 pregnancy--no current meds   Hyperemesis 09/19/2017   Seen in MAU several times--on Zofran, Reglan, Robinul, Phenergan   Maternal obesity complicating pregnancy, childbirth and the puerperium,  antepartum; 09/07/2017    Hgb A1C 5.8--needs early glucola.   Impaired glucose tolerance in pregnancy;09/20/2017   Elevated Hgb A1C at [facility]--check early glucola when patient can tolerate.   Antenatal screening finding; 02/06/201    Panorama "no call"--patient declined repeat.  Past Medical History:  Diagnosis Date  . Depression   . Diabetes mellitus without complication ([place])   . Gestational diabetes   . Obesity   . Panic attack   . Thrombocytosis ([place]) 09/20/2017  . Trichomonal vaginitis     Past Surgical History:  Procedure Laterality Date  . MOUTH SURGERY    . TONSILLECTOMY    . TONSILLECTOMY AND ADENOIDECTOMY      Family History  Problem Relation Age of Onset  . Diabetes Mother   . HIV Mother   . Diabetes Other     Social History   Tobacco Use  . Smoking status: Former Smoker    Types: Cigarettes    Last attempt to quit: 12/25/2012    Years since quitting: 5.0  . Smokeless tobacco: Never Used  Substance Use Topics  . Alcohol use: No    Comment: rare  . Drug use: No    Types: Marijuana    Comment: 11/28/17    Allergies: No Known Allergies  Medications Prior to Admission  Medication Sig Dispense Refill Last Dose  . ondansetron (ZOFRAN ODT) 8 MG disintegrating tablet Take 1 tablet (8 mg total) by mouth 2 (two) times daily. 15 tablet  0 01/07/2018 at Unknown time  . glycopyrrolate (ROBINUL-FORTE) 2 MG tablet Take 1 tablet (2 mg total) by mouth 3 (three) times daily. (Patient not taking: Reported on 11/29/2017) 60 tablet 0 Not Taking  . metoCLOPramide (REGLAN) 10 MG tablet Take 1 tablet (10 mg total) by mouth 3 (three) times daily before meals. (Patient not taking: Reported on 11/29/2017) 90 tablet 0 Not Taking  . ondansetron (ZOFRAN ODT) 4 MG disintegrating tablet Take 1 tablet (4 mg total) by mouth every 8 (eight) hours as needed for nausea or vomiting. 15 tablet 0 Not Taking  . promethazine (PHENERGAN) 25 MG suppository Place 1 suppository (25 mg total) rectally  every 8 (eight) hours as needed for nausea or vomiting. If oral meds are not helping. 12 each 0 More than a month at Unknown time  . promethazine (PHENERGAN) 25 MG tablet Take 1 tablet (25 mg total) by mouth every 6 (six) hours as needed for nausea or vomiting. Also may use 1/2 tablet as needed. (Patient not taking: Reported on 11/29/2017) 30 tablet 0 More than a month at Unknown time  . ranitidine (ZANTAC) 150 MG tablet Take 1 tablet (150 mg total) by mouth 2 (two) times daily. (Patient not taking: Reported on 11/29/2017) 60 tablet 0 More than a month at Unknown time    Review of Systems  Constitutional: Negative.   HENT: Negative.        Pain on left side of head, Headache  Eyes: Negative.   Respiratory: Negative.   Cardiovascular: Negative.   Gastrointestinal: Positive for abdominal pain and nausea.  Endocrine: Negative.   Genitourinary: Negative.   Musculoskeletal: Positive for back pain.  Skin: Negative.   Neurological: Negative.   Hematological: Negative.   Psychiatric/Behavioral: Negative.   All other systems reviewed and are negative.  Physical Exam   Blood pressure 114/65, pulse 97, temperature 98.6 F (37 C), resp. rate 18, height 5\' 7"  (1.702 m), weight 117.9 kg (260 lb), last menstrual period 07/05/2017, SpO2 100 %.  Physical Exam  Nursing note and vitals reviewed. Constitutional: She is oriented to person, place, and time. She appears well-developed and well-nourished.  HENT:  Head: Normocephalic and atraumatic.  Tender to palpate left temporal lobe, no ecchymosis, no lacerations, no erythema  Eyes: Pupils are equal, round, and reactive to light. Conjunctivae and EOM are normal.  Neck: Normal range of motion. Neck supple.  Cardiovascular: Normal rate, regular rhythm, normal heart sounds and intact distal pulses.  Respiratory: Effort normal and breath sounds normal.  GI: Soft. Bowel sounds are normal. There is no tenderness.  Musculoskeletal: Normal range of motion.  She exhibits tenderness.  Tenderness lateral to thoracic spine on left side   Neurological: She is alert and oriented to person, place, and time. She has normal reflexes.  Skin: Skin is warm and dry.  Psychiatric: She has a normal mood and affect. Her behavior is normal.    MAU Course  Procedures  : NST  MDM: s/p fall 8 hours ago, HA, back, and abdominal pain on left side, 1000mg  tylenol, 8mg  ODT zofran given, NST reactive, moderate variability, reassess, then discharge home.   Assessment and Plan  Fall/HA/backpain: Pt to f/u with [facility] on may 28 at normal scheduled ROB appointment, pt to take 1000mg  otc tylenol PRN every  8 hours po for HA/pain, take zofran for nausea. Use ice for left temple. If severe pain, vaginal bleeding, syncope, severe HA, loss of bladder or bowel function or blurred vision should occur, pt instructed  to return to the MAU.   Nausea: Script called to pharmacy for 8mg  odt zofran   Pt discharge home in stable condition, tolerated PO challenge, HA resolving with tylenol.   [place], JADE 01/08/2018, 12:50 AM

## 2018-01-08 MED ORDER — ONDANSETRON HCL 4 MG PO TABS: 4.0000 mg | ORAL_TABLET | Freq: Every day | ORAL | 1 refills | Status: DC | PRN

## 2018-01-08 MED ORDER — ONDANSETRON 8 MG PO TBDP: 8.0000 mg | ORAL_TABLET | Freq: Two times a day (BID) | ORAL | 1 refills | Status: DC

## 2018-01-08 MED ORDER — ONDANSETRON 8 MG PO TBDP: 8.0000 mg | ORAL_TABLET | Freq: Two times a day (BID) | ORAL | Status: DC

## 2018-01-25 ENCOUNTER — Ambulatory Visit (HOSPITAL_COMMUNITY)
Admission: RE | Admit: 2018-01-25 | Discharge: 2018-01-25 | Disposition: A | Discharge status: Home / Self Care | Payer: Medicaid Other | Source: Ambulatory Visit | Attending: Obstetrics & Gynecology | Admitting: Obstetrics & Gynecology

## 2018-01-25 ENCOUNTER — Other Ambulatory Visit (HOSPITAL_COMMUNITY): Payer: Self-pay | Admitting: Obstetrics and Gynecology

## 2018-01-25 DIAGNOSIS — O99213 Obesity complicating pregnancy, third trimester: Secondary | ICD-10-CM

## 2018-01-25 DIAGNOSIS — Z3A28 28 weeks gestation of pregnancy: Secondary | ICD-10-CM

## 2018-01-25 DIAGNOSIS — Z362 Encounter for other antenatal screening follow-up: Secondary | ICD-10-CM | POA: Insufficient documentation

## 2018-03-14 ENCOUNTER — Inpatient Hospital Stay (HOSPITAL_COMMUNITY)
Admission: AD | Admit: 2018-03-14 | Discharge: 2018-03-14 | Disposition: A | Discharge status: Home / Self Care | Payer: Medicaid Other | Source: Ambulatory Visit | Attending: Obstetrics and Gynecology | Admitting: Obstetrics and Gynecology

## 2018-03-14 ENCOUNTER — Encounter (HOSPITAL_COMMUNITY): Payer: Self-pay

## 2018-03-14 DIAGNOSIS — Z833 Family history of diabetes mellitus: Secondary | ICD-10-CM | POA: Diagnosis not present

## 2018-03-14 DIAGNOSIS — A5901 Trichomonal vulvovaginitis: Secondary | ICD-10-CM | POA: Diagnosis not present

## 2018-03-14 DIAGNOSIS — Z79899 Other long term (current) drug therapy: Secondary | ICD-10-CM | POA: Insufficient documentation

## 2018-03-14 DIAGNOSIS — R109 Unspecified abdominal pain: Secondary | ICD-10-CM

## 2018-03-14 DIAGNOSIS — Z3A35 35 weeks gestation of pregnancy: Secondary | ICD-10-CM | POA: Insufficient documentation

## 2018-03-14 DIAGNOSIS — M549 Dorsalgia, unspecified: Secondary | ICD-10-CM | POA: Insufficient documentation

## 2018-03-14 DIAGNOSIS — Z87891 Personal history of nicotine dependence: Secondary | ICD-10-CM | POA: Diagnosis not present

## 2018-03-14 DIAGNOSIS — Z830 Family history of human immunodeficiency virus [HIV] disease: Secondary | ICD-10-CM | POA: Insufficient documentation

## 2018-03-14 DIAGNOSIS — Z9889 Other specified postprocedural states: Secondary | ICD-10-CM | POA: Diagnosis not present

## 2018-03-14 DIAGNOSIS — O26899 Other specified pregnancy related conditions, unspecified trimester: Secondary | ICD-10-CM

## 2018-03-14 DIAGNOSIS — O24419 Gestational diabetes mellitus in pregnancy, unspecified control: Secondary | ICD-10-CM | POA: Diagnosis not present

## 2018-03-14 DIAGNOSIS — R1030 Lower abdominal pain, unspecified: Secondary | ICD-10-CM | POA: Insufficient documentation

## 2018-03-14 LAB — URINALYSIS, ROUTINE W REFLEX MICROSCOPIC
Bilirubin Urine: NEGATIVE
Glucose, UA: NEGATIVE mg/dL
Hgb urine dipstick: NEGATIVE
Ketones, ur: NEGATIVE mg/dL
Nitrite: NEGATIVE
Protein, ur: 30 mg/dL — AB
Specific Gravity, Urine: 1.029 (ref 1.005–1.030)
pH: 5 (ref 5.0–8.0)

## 2018-03-14 MED ORDER — METRONIDAZOLE 0.75 % VA GEL: 1.0000 | Freq: Two times a day (BID) | VAGINAL | 0 refills | Status: DC

## 2018-03-14 NOTE — MAU Note (Signed)
Pt is having abdominal and back pain since yesterday. Was seen in the office today and check and was told she had BV and yeast. Was treated for that with Flagyl and Diflucan. Took one dose of both and threw it up. Is still having pain and was told to come to MAU.

## 2018-03-14 NOTE — MAU Note (Signed)
Urine in lab 

## 2018-03-14 NOTE — MAU Note (Signed)
Chief Complaint:  Abdominal Pain and Back Pain   First Provider Initiated Contact with Patient 03/14/18 1943     HPI: Phyllis Dalton is a 27 y.o. G3P2002 at [redacted]w[redacted]d who presents to maternity admissions reporting abdominal pain.  Was seen in office today and was diagnosis with yeast and BV.  Pt was given Diflucan and Flagyl.  Pt states she threw up the first pills.  States pregnancy is complicated by low AFI. Repeat scan scheduled for Friday.  Was checked today.  Denies contractions, bleeding or leaking of fluid.  Location: lower abd pain Quality:cramping Severity: 4/10 in pain scale Duration: today   Modifying factors: No otc tried   Pregnancy Course:   Past Medical History:  Diagnosis Date  . Depression   . Diabetes mellitus without complication ([place])   . Gestational diabetes   . Obesity   . Panic attack   . Thrombocytosis ([place]) 09/20/2017  . Trichomonal vaginitis    OB History  Gravida Para Term Preterm AB Living  3 2 2  0 0 2  SAB TAB Ectopic Multiple Live Births  0 0 0 0 2    # Outcome Date GA Lbr Len/2nd Weight Sex Delivery Anes PTL Lv  3 Current           2 Term 08/24/13 [redacted]w[redacted]d 06:44 / 00:12 3.41 kg (7 lb 8.3 oz) M Vag-Spont Local  LIV  1 Term      Vag-Spont   LIV   Past Surgical History:  Procedure Laterality Date  . MOUTH SURGERY    . TONSILLECTOMY    . TONSILLECTOMY AND ADENOIDECTOMY     Family History  Problem Relation Age of Onset  . Diabetes Mother   . HIV Mother   . Diabetes Other    Social History   Tobacco Use  . Smoking status: Former Smoker    Types: Cigarettes    Last attempt to quit: 12/25/2012    Years since quitting: 5.2  . Smokeless tobacco: Never Used  Substance Use Topics  . Alcohol use: No    Comment: rare  . Drug use: No    Types: Marijuana    Comment: 11/28/17   No Known Allergies Medications Prior to Admission  Medication Sig Dispense Refill Last Dose  . ondansetron (ZOFRAN ODT) 8 MG disintegrating tablet Take 1 tablet (8 mg  total) by mouth 2 (two) times daily. 15 tablet 0 01/07/2018 at Unknown time  . ondansetron (ZOFRAN ODT) 8 MG disintegrating tablet Take 1 tablet (8 mg total) by mouth 2 (two) times daily. 20 tablet 1   . ondansetron (ZOFRAN) 4 MG tablet Take 1 tablet (4 mg total) by mouth daily as needed for nausea or vomiting. 30 tablet 1     I have reviewed patient's Past Medical Hx, Surgical Hx, Family Hx, Social Hx, medications and allergies.   ROS:  Review of Systems  Constitutional: Negative.   HENT: Negative.   Eyes: Negative.   Respiratory: Negative.   Cardiovascular: Negative.   Gastrointestinal: Positive for abdominal pain.  Endocrine: Negative.   Genitourinary: Negative.   Skin: Negative.   Allergic/Immunologic: Negative.   Neurological: Negative.   Hematological: Negative.   Psychiatric/Behavioral: Negative.     Physical Exam   Patient Vitals for the past 24 hrs:  BP Temp Temp src Pulse Resp SpO2  03/14/18 1930 - - - - - 96 %  03/14/18 1925 - - - - - 97 %  03/14/18 1915 - - - - - 99 %  03/14/18 1910 - - - - - 97 %  03/14/18 1857 - 97.9 F (36.6 C) Oral - 18 -  03/14/18 1856 105/65 - - (!) 114 - -   Constitutional: Well-developed, well-nourished female in no acute distress.  Cardiovascular: normal rate Respiratory: normal effort GI: Abd soft, non-tender, gravid appropriate for gestational age. Pos BS x 4 MS: Extremities nontender, no edema, normal ROM Neurologic: Alert and oriented x 4.      FHT:  Baseline 135 , moderate variability, accelerations present, no decelerations Contractions: occ   Labs: Results for orders placed or performed during the hospital encounter of 03/14/18 (from the past 24 hour(s))  Urinalysis, Routine w reflex microscopic     Status: Abnormal   Collection Time: 03/14/18  6:58 PM  Result Value Ref Range   Color, Urine AMBER (A) YELLOW   APPearance CLOUDY (A) CLEAR   Specific Gravity, Urine 1.029 1.005 - 1.030   pH 5.0 5.0 - 8.0   Glucose, UA  NEGATIVE NEGATIVE mg/dL   Hgb urine dipstick NEGATIVE NEGATIVE   Bilirubin Urine NEGATIVE NEGATIVE   Ketones, ur NEGATIVE NEGATIVE mg/dL   Protein, ur 30 (A) NEGATIVE mg/dL   Nitrite NEGATIVE NEGATIVE   Leukocytes, UA MODERATE (A) NEGATIVE   RBC / HPF 6-10 0 - 5 RBC/hpf   WBC, UA 21-50 0 - 5 WBC/hpf   Bacteria, UA RARE (A) NONE SEEN   Squamous Epithelial / LPF 21-50 0 - 5   Mucus PRESENT     Imaging:  No results found.  MAU Course: Orders Placed This Encounter  Procedures  . Urinalysis, Routine w reflex microscopic  . Discharge patient Discharge disposition: 01-Home or Self Care; Discharge patient date: 03/14/2018   Meds ordered this encounter  Medications  . metroNIDAZOLE (METROGEL VAGINAL) 0.75 % vaginal gel    Sig: Place 1 Applicatorful vaginally 2 (two) times daily.    Dispense:  70 g    Refill:  0    MDM: PE and NST reactive.  UA show dehydration.  Will send for UA. D/C home with script Metrogel.  Monistat otc.  F/U in office on Friday for repeat AFI Assessment: 1. Abdominal pain affecting pregnancy     Plan: Discharge home in stable condition.   Labor precautions and fetal kick counts Follow-up [facility] Follow up in 3 day(s).   Specialty:  Obstetrics and Gynecology Contact information: [address] [place] [address] [phone]          Allergies as of 03/14/2018   No Known Allergies     Medication List    TAKE these medications   metroNIDAZOLE 0.75 % vaginal gel Commonly known as:  METROGEL VAGINAL Place 1 Applicatorful vaginally 2 (two) times daily.   ondansetron 4 MG tablet Commonly known as:  ZOFRAN Take 1 tablet (4 mg total) by mouth daily as needed for nausea or vomiting.   ondansetron 8 MG disintegrating tablet Commonly known as:  ZOFRAN ODT Take 1 tablet (8 mg total) by mouth 2 (two) times daily.   ondansetron 8 MG disintegrating tablet Commonly  known as:  ZOFRAN ODT Take 1 tablet (8 mg total) by mouth 2 (two) times daily.       Starla Link, CNM 03/14/2018 7:52 PM

## 2018-03-21 ENCOUNTER — Encounter (HOSPITAL_COMMUNITY): Payer: Self-pay

## 2018-03-21 ENCOUNTER — Inpatient Hospital Stay (HOSPITAL_COMMUNITY)
Admission: AD | Admit: 2018-03-21 | Discharge: 2018-03-21 | Disposition: A | Discharge status: Home / Self Care | Payer: Medicaid Other | Source: Ambulatory Visit | Attending: Obstetrics and Gynecology | Admitting: Obstetrics and Gynecology

## 2018-03-21 DIAGNOSIS — O4103X1 Oligohydramnios, third trimester, fetus 1: Secondary | ICD-10-CM

## 2018-03-21 DIAGNOSIS — Z87891 Personal history of nicotine dependence: Secondary | ICD-10-CM | POA: Diagnosis not present

## 2018-03-21 DIAGNOSIS — A6000 Herpesviral infection of urogenital system, unspecified: Secondary | ICD-10-CM | POA: Diagnosis not present

## 2018-03-21 DIAGNOSIS — O98313 Other infections with a predominantly sexual mode of transmission complicating pregnancy, third trimester: Secondary | ICD-10-CM | POA: Diagnosis not present

## 2018-03-21 DIAGNOSIS — O4103X0 Oligohydramnios, third trimester, not applicable or unspecified: Secondary | ICD-10-CM | POA: Insufficient documentation

## 2018-03-21 DIAGNOSIS — Z3A36 36 weeks gestation of pregnancy: Secondary | ICD-10-CM | POA: Insufficient documentation

## 2018-03-21 DIAGNOSIS — B009 Herpesviral infection, unspecified: Secondary | ICD-10-CM

## 2018-03-21 LAB — AMNISURE RUPTURE OF MEMBRANE (ROM) NOT AT ARMC: Amnisure ROM: NEGATIVE

## 2018-03-21 MED ORDER — VALACYCLOVIR HCL 500 MG PO TABS: 500.0000 mg | ORAL_TABLET | Freq: Two times a day (BID) | ORAL | 2 refills | Status: AC

## 2018-03-21 NOTE — MAU Note (Signed)
Urine in lab 

## 2018-03-21 NOTE — MAU Provider Note (Signed)
Chief Complaint:  Contractions    HPI: Phyllis Dalton is a 27 y.o. G3P2002 at [redacted]w[redacted]d who presents to maternity admissions reporting Being sent from [facility] for monitoring and, amnisure due to low amniotic fluid on [place]. Also was 1cm dilated in office and cervical change needs to be rules out.  Pt currently being tx for yeast infection,  and on last day of tx, with no s/sx. Pt also has positive h/o HSV labs in 2016 and denies lesion or being tx at this time. Denies contractions, leakage of fluid or vaginal bleeding. Good fetal movement.   Pregnancy Course:   Past Medical History:  Diagnosis Date  . Depression   . Diabetes mellitus without complication ([place])   . Gestational diabetes   . Obesity   . Panic attack   . Thrombocytosis ([place]) 09/20/2017  . Trichomonal vaginitis    OB History  Gravida Para Term Preterm AB Living  3 2 2  0 0 2  SAB TAB Ectopic Multiple Live Births  0 0 0 0 2    # Outcome Date GA Lbr Len/2nd Weight Sex Delivery Anes PTL Lv  3 Current           2 Term 08/24/13 [redacted]w[redacted]d 06:44 / 00:12 3.41 kg (7 lb 8.3 oz) M Vag-Spont Local  LIV  1 Term      Vag-Spont   LIV   Past Surgical History:  Procedure Laterality Date  . MOUTH SURGERY    . TONSILLECTOMY    . TONSILLECTOMY AND ADENOIDECTOMY     Family History  Problem Relation Age of Onset  . Diabetes Mother   . HIV Mother   . Diabetes Other    Social History   Tobacco Use  . Smoking status: Former Smoker    Types: Cigarettes    Last attempt to quit: 12/25/2012    Years since quitting: 5.2  . Smokeless tobacco: Never Used  Substance Use Topics  . Alcohol use: No    Comment: rare  . Drug use: No    Types: Marijuana    Comment: 11/28/17   No Known Allergies No medications prior to admission.    I have reviewed patient's Past Medical Hx, Surgical Hx, Family Hx, Social Hx, medications and allergies.   ROS:  Review of Systems  All other systems reviewed and are negative.   Physical Exam   Patient Vitals for  the past 24 hrs:  BP Temp Temp src Pulse Resp SpO2  03/21/18 1636 115/69 98.6 F (37 C) Oral 97 18 -  03/21/18 1620 - - - - - 98 %  03/21/18 1405 - - - - - 98 %  03/21/18 1403 115/74 98.3 F (36.8 C) - (!) 114 18 -   Constitutional: Well-developed, well-nourished female in no acute distress.  Cardiovascular: normal rate Respiratory: normal effort GI: Abd soft, non-tender, gravid appropriate for gestational age. Pos BS x 4 MS: Extremities nontender, no edema, normal ROM Neurologic: Alert and oriented x 4.  GU: Neg CVAT.  Pelvic: NEFG, physiologic discharge, clumps of vaginal Terazol noted in vagina, no blood, cervix clean. Pelvic adequate for labor. No CMT. No vaginal or cervical lesions noted.   Dilation: 1.5 Exam by:: Noralyn Pick CNM  NST: FHR baseline 145 bpm, Variability: moderate, Accelerations:present, Decelerations:  Absent= Cat 1/Reactive UC:   1 in 10 mins, mild to palpate.  SVE:   Dilation: 1.5 Exam by:: Noralyn Pick CNM,.   Labs: Results for orders placed or performed during the hospital  encounter of 03/21/18 (from the past 24 hour(s))  Amnisure rupture of membrane (rom)not at [facility]     Status: None   Collection Time: 03/21/18  4:24 PM  Result Value Ref Range   Amnisure ROM NEGATIVE     Imaging:  No results found.  MAU Course: Orders Placed This Encounter  Procedures  . Amnisure rupture of membrane (rom)not at [facility]  . Diet - low sodium heart healthy  . Increase activity slowly  . Call MD for:  . Call MD for:  temperature >100.4  . Call MD for:  persistant nausea and vomiting  . Call MD for:  severe uncontrolled pain  . Call MD for:  redness, tenderness, or signs of infection (pain, swelling, redness, odor or green/yellow discharge around incision site)  . Call MD for:  difficulty breathing, headache or visual disturbances  . Call MD for:  hives  . Call MD for:  persistant dizziness or light-headedness  . Call MD for:  extreme fatigue  . (HEART FAILURE  PATIENTS) Call MD:  Anytime you have any of the following symptoms: 1) 3 pound weight gain in 24 hours or 5 pounds in 1 week 2) shortness of breath, with or without a dry hacking cough 3) swelling in the hands, feet or stomach 4) if you have to sleep on extra pillows at night in order to breathe.  . Discharge patient Discharge disposition: 01-Home or Self Care; Discharge patient date: 03/21/2018   Meds ordered this encounter  Medications  . valACYclovir (VALTREX) 500 MG tablet    Sig: Take 1 tablet (500 mg total) by mouth 2 (two) times daily.    Dispense:  60 tablet    Refill:  2    Order Specific Question:   Supervising Provider    Answer:   Everett Graff [[id number]]    Assessment: Phyllis Dalton is a 27 y.o. G3P2002 at [redacted]w[redacted]d , amnisure negative. No cervical change. Positive HSV in 2016 no lesion currently. Not currently contracting.  1. [redacted] weeks gestation of pregnancy   2. Oligohydramnios in third trimester, fetus 1 of multiple gestation   3. HSV infection     Plan: Discharge home in stable condition.  Labor precautions and fetal kick counts HSV: start valtrex proph now.  [facility] Follow up in 2 day(s).   Specialty:  Obstetrics and Gynecology Why:  Fpr BBP/AFI  and NST and ROB Contact information: [place]. [address] [place] [id number] [phone]          Allergies as of 03/21/2018   No Known Allergies     Medication List    TAKE these medications   metroNIDAZOLE 0.75 % vaginal gel Commonly known as:  METROGEL VAGINAL Place 1 Applicatorful vaginally 2 (two) times daily.   ondansetron 4 MG tablet Commonly known as:  ZOFRAN Take 1 tablet (4 mg total) by mouth daily as needed for nausea or vomiting.   ondansetron 8 MG disintegrating tablet Commonly known as:  ZOFRAN ODT Take 1 tablet (8 mg total) by mouth 2 (two) times daily.   ondansetron 8 MG disintegrating tablet Commonly  known as:  ZOFRAN ODT Take 1 tablet (8 mg total) by mouth 2 (two) times daily.   valACYclovir 500 MG tablet Commonly known as:  VALTREX Take 1 tablet (500 mg total) by mouth 2 (two) times daily.       [facility] NP-C, [place], [place], [place] 03/21/2018 7:52 PM

## 2018-03-21 NOTE — Progress Notes (Addendum)
Assumed care of pt after receiving report from [place] who has updated the [facility].   1545: Checked on pt. Resting quietly talking on the phone.   1600: provider at bs assessing. Plan for amnisure, spec exam and pelvic exam  4859: amnisure collected and sent to lab.  Pelvic and bi-manual exam done.   1702: provider notified. amnisure negative. Will put in d/c orders.   1720: d/c instructions given with pt understanding. Pt left unit via ambulatory with family

## 2018-03-21 NOTE — MAU Note (Signed)
Pt has been having ctx since Saturday. Was at the office today, 1cm, also showed low fluid on ultrasound.

## 2018-03-26 ENCOUNTER — Encounter (HOSPITAL_COMMUNITY): Payer: Self-pay | Admitting: *Deleted

## 2018-03-26 ENCOUNTER — Inpatient Hospital Stay (HOSPITAL_COMMUNITY)
Admission: AD | Admit: 2018-03-26 | Discharge: 2018-03-26 | Disposition: A | Discharge status: Home / Self Care | Payer: Medicaid Other | Source: Ambulatory Visit | Attending: Obstetrics and Gynecology | Admitting: Obstetrics and Gynecology

## 2018-03-26 DIAGNOSIS — Z3A36 36 weeks gestation of pregnancy: Secondary | ICD-10-CM | POA: Insufficient documentation

## 2018-03-26 DIAGNOSIS — Z87891 Personal history of nicotine dependence: Secondary | ICD-10-CM | POA: Insufficient documentation

## 2018-03-26 DIAGNOSIS — F329 Major depressive disorder, single episode, unspecified: Secondary | ICD-10-CM | POA: Diagnosis not present

## 2018-03-26 DIAGNOSIS — O99213 Obesity complicating pregnancy, third trimester: Secondary | ICD-10-CM | POA: Insufficient documentation

## 2018-03-26 DIAGNOSIS — N888 Other specified noninflammatory disorders of cervix uteri: Secondary | ICD-10-CM | POA: Insufficient documentation

## 2018-03-26 DIAGNOSIS — F410 Panic disorder [episodic paroxysmal anxiety] without agoraphobia: Secondary | ICD-10-CM | POA: Insufficient documentation

## 2018-03-26 DIAGNOSIS — O4703 False labor before 37 completed weeks of gestation, third trimester: Secondary | ICD-10-CM | POA: Insufficient documentation

## 2018-03-26 DIAGNOSIS — Z79899 Other long term (current) drug therapy: Secondary | ICD-10-CM | POA: Diagnosis not present

## 2018-03-26 DIAGNOSIS — O26893 Other specified pregnancy related conditions, third trimester: Secondary | ICD-10-CM | POA: Insufficient documentation

## 2018-03-26 DIAGNOSIS — R102 Pelvic and perineal pain: Secondary | ICD-10-CM | POA: Insufficient documentation

## 2018-03-26 DIAGNOSIS — O479 False labor, unspecified: Secondary | ICD-10-CM

## 2018-03-26 DIAGNOSIS — O3443 Maternal care for other abnormalities of cervix, third trimester: Secondary | ICD-10-CM | POA: Insufficient documentation

## 2018-03-26 DIAGNOSIS — O99343 Other mental disorders complicating pregnancy, third trimester: Secondary | ICD-10-CM | POA: Insufficient documentation

## 2018-03-26 NOTE — MAU Note (Signed)
Pt reports contractions since 5 am, intense pain on left side of her vagina. Reports good fetal movement. Denies bleeding.

## 2018-03-26 NOTE — MAU Note (Signed)
Urine in lab 

## 2018-03-26 NOTE — MAU Provider Note (Signed)
Chief Complaint:  Contractions   None    HPI: Phyllis Dalton is a 27 y.o. G3P2002 at [redacted]w[redacted]d who presents to maternity admissions reporting sharp shooting vaginal pain with contractions that started 10 hours ago, pt denies taking any medication, uses warm baths which helps. Pt believes she is in labor. Pt endorses she was recently treated for a yeast infection, but that has resolved. Denies contractions currently, no leakage of fluid or vaginal bleeding. Good fetal movement. RN report pt was not having any contraction when placed on the monitor, and pt stated that is usually what happens. Pt has a h/o hsv but no outbreak and not taken prophylactics meds due to it makes her sick. Pt  Pregnancy Course:   Past Medical History:  Diagnosis Date  . Depression   . Diabetes mellitus without complication ([place])   . Gestational diabetes   . Obesity   . Panic attack   . Thrombocytosis ([place]) 09/20/2017  . Trichomonal vaginitis    OB History  Gravida Para Term Preterm AB Living  3 2 2  0 0 2  SAB TAB Ectopic Multiple Live Births  0 0 0 0 2    # Outcome Date GA Lbr Len/2nd Weight Sex Delivery Anes PTL Lv  3 Current           2 Term 08/24/13 [redacted]w[redacted]d 06:44 / 00:12 3.41 kg (7 lb 8.3 oz) M Vag-Spont Local  LIV  1 Term      Vag-Spont   LIV   Past Surgical History:  Procedure Laterality Date  . MOUTH SURGERY    . TONSILLECTOMY    . TONSILLECTOMY AND ADENOIDECTOMY     Family History  Problem Relation Age of Onset  . Diabetes Mother   . HIV Mother   . Diabetes Other    Social History   Tobacco Use  . Smoking status: Former Smoker    Types: Cigarettes    Last attempt to quit: 12/25/2012    Years since quitting: 5.2  . Smokeless tobacco: Never Used  Substance Use Topics  . Alcohol use: No    Comment: rare  . Drug use: No    Types: Marijuana    Comment: 11/28/17   No Known Allergies Medications Prior to Admission  Medication Sig Dispense Refill Last Dose  . metroNIDAZOLE (METROGEL VAGINAL)  0.75 % vaginal gel Place 1 Applicatorful vaginally 2 (two) times daily. (Patient not taking: Reported on 03/21/2018) 70 g 0 Not Taking at Unknown time  . ondansetron (ZOFRAN ODT) 8 MG disintegrating tablet Take 1 tablet (8 mg total) by mouth 2 (two) times daily. (Patient not taking: Reported on 03/21/2018) 15 tablet 0 Not Taking at Unknown time  . ondansetron (ZOFRAN ODT) 8 MG disintegrating tablet Take 1 tablet (8 mg total) by mouth 2 (two) times daily. (Patient not taking: Reported on 03/21/2018) 20 tablet 1 Not Taking at Unknown time  . ondansetron (ZOFRAN) 4 MG tablet Take 1 tablet (4 mg total) by mouth daily as needed for nausea or vomiting. (Patient not taking: Reported on 03/21/2018) 30 tablet 1 Not Taking at Unknown time  . valACYclovir (VALTREX) 500 MG tablet Take 1 tablet (500 mg total) by mouth 2 (two) times daily. 60 tablet 2     I have reviewed patient's Past Medical Hx, Surgical Hx, Family Hx, Social Hx, medications and allergies.   ROS:  Review of Systems  All other systems reviewed and are negative.   Physical Exam   Patient Vitals for the  past 24 hrs:  BP Temp Temp src Pulse Resp SpO2 Height Weight  03/26/18 1206 120/70 97.9 F (36.6 C) Oral 95 17 97 % 5' 6.5" (1.689 m) 114.8 kg (253 lb)   Constitutional: Well-developed, well-nourished female in no acute distress.  Cardiovascular: normal rate Respiratory: normal effort GI: Abd soft, non-tender, gravid appropriate for gestational age. Pos BS x 4 MS: Extremities nontender, no edema, normal ROM Neurologic: Alert and oriented x 4.  GU: Neg CVAT.  Pelvic: NEFG, physiologic discharge, no blood, cervix clean, several nabothian cyts were seen at 0500 o'clock. Pelvic adequate for labor. No CMT  Dilation: 2 Effacement (%): 60 Station: -2 Presentation: Vertex Exam by:: F. Morris, RNC SVE unchanged in 1.5 hours with monitoring.   NST: FHR baseline 135 bpm, Variability: moderate, Accelerations:present, Decelerations:  Absent= Cat  1/Reactive UC:   none SVE:   Dilation: 2 Effacement (%): 60 Station: -2 Exam by:: F. Morris, RNC,  SVE remain the same and unchanged after 1.5 hours of monitoring.   Labs: No results found for this or any previous visit (from the past 24 hour(s)).  Imaging:  No results found.  MAU Course: Orders Placed This Encounter  Procedures  . Diet - low sodium heart healthy  . Increase activity slowly  . Call MD for:  . Call MD for:  temperature >100.4  . Call MD for:  persistant nausea and vomiting  . Call MD for:  severe uncontrolled pain  . Call MD for:  redness, tenderness, or signs of infection (pain, swelling, redness, odor or green/yellow discharge around incision site)  . Call MD for:  difficulty breathing, headache or visual disturbances  . Call MD for:  hives  . Call MD for:  persistant dizziness or light-headedness  . Call MD for:  extreme fatigue  . (HEART FAILURE PATIENTS) Call MD:  Anytime you have any of the following symptoms: 1) 3 pound weight gain in 24 hours or 5 pounds in 1 week 2) shortness of breath, with or without a dry hacking cough 3) swelling in the hands, feet or stomach 4) if you have to sleep on extra pillows at night in order to breathe.  . Discharge patient Discharge disposition: 01-Home or Self Care; Discharge patient date: 03/26/2018   No orders of the defined types were placed in this encounter.   Assessment: Phyllis Dalton is a 27 y.o. G3P2002 at [redacted]w[redacted]d with nabothian cysts, not taking valtrex for prophylactics for HSV positive test. Pt contraction resolved with no dilation of cervix, Cat 1. Pt stable.  1. [redacted] weeks gestation of pregnancy   2. Braxton Hick's contraction   3. Nabothian cyst   4. Vaginal pain     Plan: Discharge home in stable condition.  Use belly belt to take pressure of cervix, warm baths, tylenol.  HSV Positive labs: take valtrex prophylactic until delivery.  Labor precautions and fetal kick counts Follow-up [facility] Follow up in 1 week(s).   Specialty:  Obstetrics and Gynecology Contact information: [address]. [address] [place] [phone] [phone]          Allergies as of 03/26/2018   No Known Allergies     Medication List    TAKE these medications   metroNIDAZOLE 0.75 % vaginal gel Commonly known as:  METROGEL VAGINAL Place 1 Applicatorful vaginally 2 (two) times daily.   ondansetron 4 MG tablet Commonly known as:  ZOFRAN Take 1 tablet (4 mg  total) by mouth daily as needed for nausea or vomiting.   ondansetron 8 MG disintegrating tablet Commonly known as:  ZOFRAN ODT Take 1 tablet (8 mg total) by mouth 2 (two) times daily.   ondansetron 8 MG disintegrating tablet Commonly known as:  ZOFRAN ODT Take 1 tablet (8 mg total) by mouth 2 (two) times daily.   valACYclovir 500 MG tablet Commonly known as:  VALTREX Take 1 tablet (500 mg total) by mouth 2 (two) times daily.       [facility] NP-C, CNM [place], [place] 03/26/2018 3:16 PM

## 2018-03-31 ENCOUNTER — Encounter (HOSPITAL_COMMUNITY): Payer: Self-pay | Admitting: *Deleted

## 2018-03-31 ENCOUNTER — Inpatient Hospital Stay (HOSPITAL_COMMUNITY)
Admission: AD | Admit: 2018-03-31 | Discharge: 2018-03-31 | Disposition: A | Discharge status: Home / Self Care | Payer: Medicaid Other | Source: Ambulatory Visit | Attending: Obstetrics and Gynecology | Admitting: Obstetrics and Gynecology

## 2018-03-31 DIAGNOSIS — Z3A00 Weeks of gestation of pregnancy not specified: Secondary | ICD-10-CM | POA: Diagnosis not present

## 2018-03-31 DIAGNOSIS — O479 False labor, unspecified: Secondary | ICD-10-CM | POA: Diagnosis present

## 2018-03-31 NOTE — Discharge Instructions (Signed)
Braxton Hicks Contractions °Contractions of the uterus can occur throughout pregnancy, but they are not always a sign that you are in labor. You may have practice contractions called Braxton Hicks contractions. These false labor contractions are sometimes confused with true labor. °What are Braxton Hicks contractions? °Braxton Hicks contractions are tightening movements that occur in the muscles of the uterus before labor. Unlike true labor contractions, these contractions do not result in opening (dilation) and thinning of the cervix. Toward the end of pregnancy (32-34 weeks), Braxton Hicks contractions can happen more often and may become stronger. These contractions are sometimes difficult to tell apart from true labor because they can be very uncomfortable. You should not feel embarrassed if you go to the hospital with false labor. °Sometimes, the only way to tell if you are in true labor is for your health care provider to look for changes in the cervix. The health care provider will do a physical exam and may monitor your contractions. If you are not in true labor, the exam should show that your cervix is not dilating and your water has not broken. °If there are other health problems associated with your pregnancy, it is completely safe for you to be sent home with false labor. You may continue to have Braxton Hicks contractions until you go into true labor. °How to tell the difference between true labor and false labor °True labor °· Contractions last 30-70 seconds. °· Contractions become very regular. °· Discomfort is usually felt in the top of the uterus, and it spreads to the lower abdomen and low back. °· Contractions do not go away with walking. °· Contractions usually become more intense and increase in frequency. °· The cervix dilates and gets thinner. °False labor °· Contractions are usually shorter and not as strong as true labor contractions. °· Contractions are usually irregular. °· Contractions  are often felt in the front of the lower abdomen and in the groin. °· Contractions may go away when you walk around or change positions while lying down. °· Contractions get weaker and are shorter-lasting as time goes on. °· The cervix usually does not dilate or become thin. °Follow these instructions at home: °· Take over-the-counter and prescription medicines only as told by your health care provider. °· Keep up with your usual exercises and follow other instructions from your health care provider. °· Eat and drink lightly if you think you are going into labor. °· If Braxton Hicks contractions are making you uncomfortable: °? Change your position from lying down or resting to walking, or change from walking to resting. °? Sit and rest in a tub of warm water. °? Drink enough fluid to keep your urine pale yellow. Dehydration may cause these contractions. °? Do slow and deep breathing several times an hour. °· Keep all follow-up prenatal visits as told by your health care provider. This is important. °Contact a health care provider if: °· You have a fever. °· You have continuous pain in your abdomen. °Get help right away if: °· Your contractions become stronger, more regular, and closer together. °· You have fluid leaking or gushing from your vagina. °· You pass blood-tinged mucus (bloody show). °· You have bleeding from your vagina. °· You have low back pain that you never had before. °· You feel your baby’s head pushing down and causing pelvic pressure. °· Your baby is not moving inside you as much as it used to. °Summary °· Contractions that occur before labor are called Braxton   Hicks contractions, false labor, or practice contractions. °· Braxton Hicks contractions are usually shorter, weaker, farther apart, and less regular than true labor contractions. True labor contractions usually become progressively stronger and regular and they become more frequent. °· Manage discomfort from Braxton Hicks contractions by  changing position, resting in a warm bath, drinking plenty of water, or practicing deep breathing. °This information is not intended to replace advice given to you by your health care provider. Make sure you discuss any questions you have with your health care provider. °Document Released: 12/22/2016 Document Revised: 12/22/2016 Document Reviewed: 12/22/2016 °Elsevier Interactive Patient Education © 2018 [employer] Inc[employer] ° °

## 2018-03-31 NOTE — MAU Note (Signed)
Have had ctxs since 1755. Denies LOF or bleeding. 2cm last sve. Good FM

## 2018-04-04 ENCOUNTER — Encounter (HOSPITAL_COMMUNITY): Payer: Self-pay

## 2018-04-04 ENCOUNTER — Inpatient Hospital Stay (HOSPITAL_COMMUNITY)
Admission: AD | Admit: 2018-04-04 | Discharge: 2018-04-04 | Disposition: A | Discharge status: Home / Self Care | Payer: Medicaid Other | Source: Ambulatory Visit | Attending: Obstetrics & Gynecology | Admitting: Obstetrics & Gynecology

## 2018-04-04 DIAGNOSIS — R109 Unspecified abdominal pain: Principal | ICD-10-CM

## 2018-04-04 DIAGNOSIS — O26893 Other specified pregnancy related conditions, third trimester: Secondary | ICD-10-CM

## 2018-04-04 LAB — URINALYSIS, ROUTINE W REFLEX MICROSCOPIC
Bilirubin Urine: NEGATIVE
Glucose, UA: NEGATIVE mg/dL
Hgb urine dipstick: NEGATIVE
Ketones, ur: NEGATIVE mg/dL
Nitrite: NEGATIVE
Protein, ur: NEGATIVE mg/dL
Specific Gravity, Urine: 1.017 (ref 1.005–1.030)
pH: 7 (ref 5.0–8.0)

## 2018-04-04 MED ORDER — ACETAMINOPHEN 325 MG PO TABS
650.0000 mg | ORAL_TABLET | Freq: Once | ORAL | Status: AC
  Administered 2018-04-04: 650 mg via ORAL
  Filled 2018-04-04: qty 2

## 2018-04-04 MED ORDER — OXYCODONE HCL 5 MG PO TABS: 5.0000 mg | ORAL_TABLET | Freq: Once | ORAL | Status: DC

## 2018-04-04 MED ORDER — OXYCODONE HCL 5 MG PO TABS: 10.0000 mg | ORAL_TABLET | Freq: Once | ORAL | Status: DC

## 2018-04-04 MED ORDER — CYCLOBENZAPRINE HCL 5 MG PO TABS
5.0000 mg | ORAL_TABLET | Freq: Once | ORAL | Status: AC
  Administered 2018-04-04: 5 mg via ORAL
  Filled 2018-04-04: qty 1

## 2018-04-04 NOTE — MAU Provider Note (Addendum)
History     CSN: [id number]  Arrival date and time: 04/04/18 0931   None     Chief Complaint  Patient presents with  . Abdominal Pain   Patient initially came in as a labor check. Her pain is primarily located on her left side and encompasses her left upper and lower quadrants, left flank, and left back. It radiates down to her vagina and left leg. She states it is a 8/10 pain and has been present since early this morning. She verbalizes she has been constipated from the Zofran she has been taking and denies diarrhea, she had a BM yesterday. She denies any vaginal or urinary symptoms other than frenquency.      OB History    Gravida  3   Para  2   Term  2   Preterm  0   AB  0   Living  2     SAB  0   TAB  0   Ectopic  0   Multiple  0   Live Births  2           Past Medical History:  Diagnosis Date  . Depression   . Diabetes mellitus without complication ([place])   . Gestational diabetes   . Obesity   . Panic attack   . Thrombocytosis ([place]) 09/20/2017  . Trichomonal vaginitis     Past Surgical History:  Procedure Laterality Date  . MOUTH SURGERY    . TONSILLECTOMY    . TONSILLECTOMY AND ADENOIDECTOMY      Family History  Problem Relation Age of Onset  . Diabetes Mother   . HIV Mother   . Diabetes Other     Social History   Tobacco Use  . Smoking status: Former Smoker    Types: Cigarettes    Last attempt to quit: 12/25/2012    Years since quitting: 5.2  . Smokeless tobacco: Never Used  Substance Use Topics  . Alcohol use: No    Comment: rare  . Drug use: No    Types: Marijuana    Comment: 11/28/17    Allergies: No Known Allergies  Medications Prior to Admission  Medication Sig Dispense Refill Last Dose  . metroNIDAZOLE (METROGEL VAGINAL) 0.75 % vaginal gel Place 1 Applicatorful vaginally 2 (two) times daily. (Patient not taking: Reported on 03/21/2018) 70 g 0 Not Taking at Unknown time  . ondansetron (ZOFRAN ODT) 8 MG disintegrating  tablet Take 1 tablet (8 mg total) by mouth 2 (two) times daily. (Patient not taking: Reported on 03/21/2018) 15 tablet 0 Not Taking at Unknown time  . ondansetron (ZOFRAN ODT) 8 MG disintegrating tablet Take 1 tablet (8 mg total) by mouth 2 (two) times daily. (Patient not taking: Reported on 03/21/2018) 20 tablet 1 Not Taking at Unknown time  . ondansetron (ZOFRAN) 4 MG tablet Take 1 tablet (4 mg total) by mouth daily as needed for nausea or vomiting. (Patient not taking: Reported on 03/21/2018) 30 tablet 1 Not Taking at Unknown time  . valACYclovir (VALTREX) 500 MG tablet Take 1 tablet (500 mg total) by mouth 2 (two) times daily. 60 tablet 2     Review of Systems  Gastrointestinal: Positive for abdominal pain and constipation. Negative for diarrhea, nausea and vomiting.  Genitourinary: Positive for flank pain and frequency. Negative for dysuria, urgency, vaginal bleeding and vaginal discharge.  Musculoskeletal: Positive for back pain.  All other systems reviewed and are negative.  Physical Exam   Vitals:   04/04/18  0937  BP: 109/66  Pulse: 96  Resp: 16  Temp: 97.6 F (36.4 C)  TempSrc: Oral    Physical Exam  Nursing note and vitals reviewed. Constitutional: She is oriented to person, place, and time. She appears well-developed and well-nourished.  HENT:  Head: Normocephalic.  Eyes: Pupils are equal, round, and reactive to light.  Cardiovascular: Normal rate, regular rhythm and normal heart sounds.  Respiratory: Effort normal and breath sounds normal.  GI: Soft. She exhibits no mass. There is tenderness in the left upper quadrant and left lower quadrant. There is CVA tenderness. There is no guarding.  Musculoskeletal: Normal range of motion. She exhibits tenderness.  Left flank and left upper and lower back are tender to palpation   Neurological: She is alert and oriented to person, place, and time. She has normal reflexes.  Skin: Skin is warm and dry.  Psychiatric: She has a normal  mood and affect. Her behavior is normal. Judgment and thought content normal.   Fetal heart tones: Reactive NST  Dilation: 2.5 Effacement (%): 60 Cervical Position: Posterior Station: -3 Presentation: Vertex Exam by:: Wilhemena Durie RN  MAU Course  Procedures Urinalysis   MDM Patient is not laboring, she is not contracting. She appears to have some uterine irritability. Patient's pain and tenderness to palpation is present over a large area: the entire left quadrant, left flank, and left back. She has CVA tenderness. Urinalysis and culture to rule out UTI. Urinalysis does not support UTI as a diagnosis but urine culture will still be sent to completely rule it out. Because of large diffuse area associated with shooting pain and numbness down her left leg, I strongly suspect it is musculoskeletal in nature and related to carrying a third baby in the 3rd trimester. Patient given Tylenol with no relief. Then patient given Flexeril with no relief. Consulted Dr. Alesia Richards and had ordered Oxycodone but patient requested discharge and did not desire to stay to try other pain relief methods. Patient has appointment at 2pm today in the office and will discuss 39 week IOL at that appointment. Discharged home with labor precautions and information on sciatic nerve pain and abdominal pain in pregnancy.   Assessment and Plan  27 y.o. G3P2 at 38w Abdominal pain and sciatica in pregnancy Patient desires IOL at 39 weeks  Follow to today in office for ROB and BPP Labor precautions and information provided on sciatic nerve and abdominal pain in pregnancy   Marikay Alar 04/04/2018, 10:05 AM

## 2018-04-04 NOTE — Discharge Instructions (Signed)
Braxton Hicks Contractions °Contractions of the uterus can occur throughout pregnancy, but they are not always a sign that you are in labor. You may have practice contractions called Braxton Hicks contractions. These false labor contractions are sometimes confused with true labor. °What are Braxton Hicks contractions? °Braxton Hicks contractions are tightening movements that occur in the muscles of the uterus before labor. Unlike true labor contractions, these contractions do not result in opening (dilation) and thinning of the cervix. Toward the end of pregnancy (32-34 weeks), Braxton Hicks contractions can happen more often and may become stronger. These contractions are sometimes difficult to tell apart from true labor because they can be very uncomfortable. You should not feel embarrassed if you go to the hospital with false labor. °Sometimes, the only way to tell if you are in true labor is for your health care provider to look for changes in the cervix. The health care provider will do a physical exam and may monitor your contractions. If you are not in true labor, the exam should show that your cervix is not dilating and your water has not broken. °If there are other health problems associated with your pregnancy, it is completely safe for you to be sent home with false labor. You may continue to have Braxton Hicks contractions until you go into true labor. °How to tell the difference between true labor and false labor °True labor °· Contractions last 30-70 seconds. °· Contractions become very regular. °· Discomfort is usually felt in the top of the uterus, and it spreads to the lower abdomen and low back. °· Contractions do not go away with walking. °· Contractions usually become more intense and increase in frequency. °· The cervix dilates and gets thinner. °False labor °· Contractions are usually shorter and not as strong as true labor contractions. °· Contractions are usually irregular. °· Contractions  are often felt in the front of the lower abdomen and in the groin. °· Contractions may go away when you walk around or change positions while lying down. °· Contractions get weaker and are shorter-lasting as time goes on. °· The cervix usually does not dilate or become thin. °Follow these instructions at home: °· Take over-the-counter and prescription medicines only as told by your health care provider. °· Keep up with your usual exercises and follow other instructions from your health care provider. °· Eat and drink lightly if you think you are going into labor. °· If Braxton Hicks contractions are making you uncomfortable: °? Change your position from lying down or resting to walking, or change from walking to resting. °? Sit and rest in a tub of warm water. °? Drink enough fluid to keep your urine pale yellow. Dehydration may cause these contractions. °? Do slow and deep breathing several times an hour. °· Keep all follow-up prenatal visits as told by your health care provider. This is important. °Contact a health care provider if: °· You have a fever. °· You have continuous pain in your abdomen. °Get help right away if: °· Your contractions become stronger, more regular, and closer together. °· You have fluid leaking or gushing from your vagina. °· You pass blood-tinged mucus (bloody show). °· You have bleeding from your vagina. °· You have low back pain that you never had before. °· You feel your baby’s head pushing down and causing pelvic pressure. °· Your baby is not moving inside you as much as it used to. °Summary °· Contractions that occur before labor are called Braxton   Hicks contractions, false labor, or practice contractions.  Braxton Hicks contractions are usually shorter, weaker, farther apart, and less regular than true labor contractions. True labor contractions usually become progressively stronger and regular and they become more frequent.  Manage discomfort from [name] contractions by  changing position, resting in a warm bath, drinking plenty of water, or practicing deep breathing. This information is not intended to replace advice given to you by your health care provider. Make sure you discuss any questions you have with your health care provider. Document Released: 12/22/2016 Document Revised: 12/22/2016 Document Reviewed: 12/22/2016 Elsevier Interactive Patient Education  2018 [place].   Abdominal Pain During Pregnancy Belly (abdominal) pain is common during pregnancy. Most of the time, it is not a serious problem. Other times, it can be a sign that something is wrong with the pregnancy. Always tell your doctor if you have belly pain. Follow these instructions at home: Monitor your belly pain for any changes. The following actions may help you feel better:  Do not have sex (intercourse) or put anything in your vagina until you feel better.  Rest until your pain stops.  Drink clear fluids if you feel sick to your stomach (nauseous). Do not eat solid food until you feel better.  Only take medicine as told by your doctor.  Keep all doctor visits as told.  Get help right away if:  You are bleeding, leaking fluid, or pieces of tissue come out of your vagina.  You have more pain or cramping.  You keep throwing up (vomiting).  You have pain when you pee (urinate) or have blood in your pee.  You have a fever.  You do not feel your baby moving as much.  You feel very weak or feel like passing out.  You have trouble breathing, with or without belly pain.  You have a very bad headache and belly pain.  You have fluid leaking from your vagina and belly pain.  You keep having watery poop (diarrhea).  Your belly pain does not go away after resting, or the pain gets worse. This information is not intended to replace advice given to you by your health care provider. Make sure you discuss any questions you have with your health care provider. Document  Released: 07/27/2009 Document Revised: 03/16/2016 Document Reviewed: 03/07/2013 Elsevier Interactive Patient Education  2018 [place].  Sciatica Sciatica is pain, numbness, weakness, or tingling along your sciatic nerve. The sciatic nerve starts in the lower back and goes down the back of each leg. Sciatica happens when this nerve is pinched or has pressure put on it. Sciatica usually goes away on its own or with treatment. Sometimes, sciatica may keep coming back (recur). Follow these instructions at home: Medicines  Take over-the-counter and prescription medicines only as told by your doctor.  Do not drive or use heavy machinery while taking prescription pain medicine. Managing pain  If directed, put ice on the affected area. ? Put ice in a plastic bag. ? Place a towel between your skin and the bag. ? Leave the ice on for 20 minutes, 2-3 times a day.  After icing, apply heat to the affected area before you exercise or as often as told by your doctor. Use the heat source that your doctor tells you to use, such as a moist heat pack or a heating pad. ? Place a towel between your skin and the heat source. ? Leave the heat on for 20-30 minutes. ? Remove the heat  if your skin turns bright red. This is especially important if you are unable to feel pain, heat, or cold. You may have a greater risk of getting burned. Activity  Return to your normal activities as told by your doctor. Ask your doctor what activities are safe for you. ? Avoid activities that make your sciatica worse.  Take short rests during the day. Rest in a lying or standing position. This is usually better than sitting to rest. ? When you rest for a long time, do some physical activity or stretching between periods of rest. ? Avoid sitting for a long time without moving. Get up and move around at least one time each hour.  Exercise and stretch regularly, as told by your doctor.  Do not lift anything that is heavier  than 10 lb (4.5 kg) while you have symptoms of sciatica. ? Avoid lifting heavy things even when you do not have symptoms. ? Avoid lifting heavy things over and over.  When you lift objects, always lift in a way that is safe for your body. To do this, you should: ? Bend your knees. ? Keep the object close to your body. ? Avoid twisting. General instructions  Use good posture. ? Avoid leaning forward when you are sitting. ? Avoid hunching over when you are standing.  Stay at a healthy weight.  Wear comfortable shoes that support your feet. Avoid wearing high heels.  Avoid sleeping on a mattress that is too soft or too hard. You might have less pain if you sleep on a mattress that is firm enough to support your back.  Keep all follow-up visits as told by your doctor. This is important. Contact a doctor if:  You have pain that: ? Wakes you up when you are sleeping. ? Gets worse when you lie down. ? Is worse than the pain you have had in the past. ? Lasts longer than 4 weeks.  You lose weight for without trying. Get help right away if:  You cannot control when you pee (urinate) or poop (have a bowel movement).  You have weakness in any of these areas and it gets worse. ? Lower back. ? Lower belly (pelvis). ? Butt (buttocks). ? Legs.  You have redness or swelling of your back.  You have a burning feeling when you pee. This information is not intended to replace advice given to you by your health care provider. Make sure you discuss any questions you have with your health care provider. Document Released: 05/17/2008 Document Revised: 01/14/2016 Document Reviewed: 04/17/2015 Elsevier Interactive Patient Education  [employer].

## 2018-04-04 NOTE — MAU Note (Signed)
Pt arrive EMS for pain in her left side. Has appointment later today but couldn't wait. Says the contractions start and then stop. No LOF, no bleeding, + FM.

## 2018-04-05 ENCOUNTER — Inpatient Hospital Stay (HOSPITAL_COMMUNITY)
Admission: AD | Admit: 2018-04-05 | Discharge: 2018-04-07 | DRG: 798 | Disposition: A | Discharge status: Home / Self Care | Payer: Medicaid Other | Attending: Obstetrics and Gynecology | Admitting: Obstetrics and Gynecology

## 2018-04-05 ENCOUNTER — Inpatient Hospital Stay (HOSPITAL_BASED_OUTPATIENT_CLINIC_OR_DEPARTMENT_OTHER): Payer: Medicaid Other

## 2018-04-05 ENCOUNTER — Other Ambulatory Visit: Payer: Self-pay

## 2018-04-05 ENCOUNTER — Encounter (HOSPITAL_COMMUNITY): Payer: Self-pay

## 2018-04-05 DIAGNOSIS — O99344 Other mental disorders complicating childbirth: Secondary | ICD-10-CM | POA: Diagnosis present

## 2018-04-05 DIAGNOSIS — O99213 Obesity complicating pregnancy, third trimester: Secondary | ICD-10-CM | POA: Diagnosis not present

## 2018-04-05 DIAGNOSIS — F419 Anxiety disorder, unspecified: Secondary | ICD-10-CM | POA: Diagnosis present

## 2018-04-05 DIAGNOSIS — Z3A38 38 weeks gestation of pregnancy: Secondary | ICD-10-CM | POA: Diagnosis not present

## 2018-04-05 DIAGNOSIS — Z87891 Personal history of nicotine dependence: Secondary | ICD-10-CM | POA: Diagnosis not present

## 2018-04-05 DIAGNOSIS — Z302 Encounter for sterilization: Secondary | ICD-10-CM

## 2018-04-05 DIAGNOSIS — O289 Unspecified abnormal findings on antenatal screening of mother: Secondary | ICD-10-CM

## 2018-04-05 DIAGNOSIS — Z3483 Encounter for supervision of other normal pregnancy, third trimester: Secondary | ICD-10-CM | POA: Diagnosis present

## 2018-04-05 DIAGNOSIS — O09293 Supervision of pregnancy with other poor reproductive or obstetric history, third trimester: Secondary | ICD-10-CM | POA: Diagnosis not present

## 2018-04-05 DIAGNOSIS — F329 Major depressive disorder, single episode, unspecified: Secondary | ICD-10-CM | POA: Diagnosis present

## 2018-04-05 LAB — CBC
HCT: 33.7 % — ABNORMAL LOW (ref 36.0–46.0)
Hemoglobin: 11.1 g/dL — ABNORMAL LOW (ref 12.0–15.0)
MCH: 25.4 pg — ABNORMAL LOW (ref 26.0–34.0)
MCHC: 32.9 g/dL (ref 30.0–36.0)
MCV: 77.1 fL — ABNORMAL LOW (ref 78.0–100.0)
Platelets: 420 10*3/uL — ABNORMAL HIGH (ref 150–400)
RBC: 4.37 MIL/uL (ref 3.87–5.11)
RDW: 14.4 % (ref 11.5–15.5)
WBC: 11.3 10*3/uL — ABNORMAL HIGH (ref 4.0–10.5)

## 2018-04-05 LAB — TYPE AND SCREEN
ABO/RH(D): O POS
Antibody Screen: NEGATIVE

## 2018-04-05 LAB — GLUCOSE, CAPILLARY: Glucose-Capillary: 70 mg/dL (ref 70–99)

## 2018-04-05 MED ORDER — SIMETHICONE 80 MG PO CHEW: 80.0000 mg | CHEWABLE_TABLET | ORAL | Status: DC | PRN

## 2018-04-05 MED ORDER — ZOLPIDEM TARTRATE 5 MG PO TABS: 5.0000 mg | ORAL_TABLET | Freq: Every evening | ORAL | Status: DC | PRN

## 2018-04-05 MED ORDER — ONDANSETRON HCL 4 MG/2ML IJ SOLN: 4.0000 mg | INTRAMUSCULAR | Status: DC | PRN

## 2018-04-05 MED ORDER — PHENYLEPHRINE 40 MCG/ML (10ML) SYRINGE FOR IV PUSH (FOR BLOOD PRESSURE SUPPORT)
80.0000 ug | PREFILLED_SYRINGE | INTRAVENOUS | Status: DC | PRN
  Filled 2018-04-05: qty 5

## 2018-04-05 MED ORDER — PENICILLIN G 3 MILLION UNITS IVPB - SIMPLE MED
3.0000 10*6.[IU] | INTRAVENOUS | Status: DC
  Filled 2018-04-05 (×3): qty 100

## 2018-04-05 MED ORDER — OXYCODONE-ACETAMINOPHEN 5-325 MG PO TABS
1.0000 | ORAL_TABLET | ORAL | Status: DC | PRN
Start: 2018-04-05 — End: 2018-04-05

## 2018-04-05 MED ORDER — LACTATED RINGERS IV SOLN: 500.0000 mL | Freq: Once | INTRAVENOUS | Status: DC

## 2018-04-05 MED ORDER — PANTOPRAZOLE SODIUM 20 MG PO TBEC
20.0000 mg | DELAYED_RELEASE_TABLET | Freq: Once | ORAL | Status: AC
  Administered 2018-04-05: 20 mg via ORAL
  Filled 2018-04-05: qty 1

## 2018-04-05 MED ORDER — CYCLOBENZAPRINE HCL 10 MG PO TABS
10.0000 mg | ORAL_TABLET | Freq: Once | ORAL | Status: AC
  Administered 2018-04-05: 10 mg via ORAL
  Filled 2018-04-05: qty 1

## 2018-04-05 MED ORDER — LACTATED RINGERS IV SOLN: 500.0000 mL | INTRAVENOUS | Status: DC | PRN

## 2018-04-05 MED ORDER — FENTANYL CITRATE (PF) 100 MCG/2ML IJ SOLN
100.0000 ug | INTRAMUSCULAR | Status: DC | PRN
  Administered 2018-04-05 (×3): 100 ug via INTRAVENOUS
  Filled 2018-04-05 (×2): qty 2

## 2018-04-05 MED ORDER — PRENATAL MULTIVITAMIN CH
1.0000 | ORAL_TABLET | Freq: Every day | ORAL | Status: DC
  Administered 2018-04-06 – 2018-04-07 (×2): 1 via ORAL
  Filled 2018-04-05 (×2): qty 1

## 2018-04-05 MED ORDER — PENICILLIN G POTASSIUM 5000000 UNITS IJ SOLR
5.0000 10*6.[IU] | Freq: Once | INTRAMUSCULAR | Status: AC
  Administered 2018-04-05: 5 10*6.[IU] via INTRAVENOUS
  Filled 2018-04-05: qty 5

## 2018-04-05 MED ORDER — SOD CITRATE-CITRIC ACID 500-334 MG/5ML PO SOLN: 30.0000 mL | ORAL | Status: DC | PRN

## 2018-04-05 MED ORDER — LACTATED RINGERS IV SOLN
INTRAVENOUS | Status: DC
  Administered 2018-04-05: 14:00:00 via INTRAVENOUS

## 2018-04-05 MED ORDER — ACETAMINOPHEN 325 MG PO TABS
650.0000 mg | ORAL_TABLET | ORAL | Status: DC | PRN
  Administered 2018-04-06: 650 mg via ORAL
  Filled 2018-04-05: qty 2

## 2018-04-05 MED ORDER — ONDANSETRON HCL 4 MG PO TABS: 4.0000 mg | ORAL_TABLET | ORAL | Status: DC | PRN

## 2018-04-05 MED ORDER — TETANUS-DIPHTH-ACELL PERTUSSIS 5-2.5-18.5 LF-MCG/0.5 IM SUSP: 0.5000 mL | Freq: Once | INTRAMUSCULAR | Status: DC

## 2018-04-05 MED ORDER — ONDANSETRON HCL 4 MG/2ML IJ SOLN: 4.0000 mg | Freq: Four times a day (QID) | INTRAMUSCULAR | Status: DC | PRN

## 2018-04-05 MED ORDER — SENNOSIDES-DOCUSATE SODIUM 8.6-50 MG PO TABS
2.0000 | ORAL_TABLET | ORAL | Status: DC
  Administered 2018-04-05 – 2018-04-07 (×2): 2 via ORAL
  Filled 2018-04-05 (×2): qty 2

## 2018-04-05 MED ORDER — BENZOCAINE-MENTHOL 20-0.5 % EX AERO: 1.0000 "application " | INHALATION_SPRAY | CUTANEOUS | Status: DC | PRN

## 2018-04-05 MED ORDER — EPHEDRINE 5 MG/ML INJ
10.0000 mg | INTRAVENOUS | Status: DC | PRN
Start: 2018-04-05 — End: 2018-04-05
  Filled 2018-04-05: qty 2

## 2018-04-05 MED ORDER — DIBUCAINE 1 % RE OINT: 1.0000 "application " | TOPICAL_OINTMENT | RECTAL | Status: DC | PRN

## 2018-04-05 MED ORDER — FENTANYL CITRATE (PF) 100 MCG/2ML IJ SOLN
INTRAMUSCULAR | Status: AC
  Filled 2018-04-05: qty 2

## 2018-04-05 MED ORDER — EPHEDRINE 5 MG/ML INJ
10.0000 mg | INTRAVENOUS | Status: DC | PRN
  Filled 2018-04-05: qty 2

## 2018-04-05 MED ORDER — ACETAMINOPHEN 325 MG PO TABS: 650.0000 mg | ORAL_TABLET | ORAL | Status: DC | PRN

## 2018-04-05 MED ORDER — LACTATED RINGERS IV SOLN: INTRAVENOUS | Status: DC

## 2018-04-05 MED ORDER — LIDOCAINE HCL (PF) 1 % IJ SOLN
30.0000 mL | INTRAMUSCULAR | Status: DC | PRN
  Filled 2018-04-05: qty 30

## 2018-04-05 MED ORDER — COCONUT OIL OIL: 1.0000 "application " | TOPICAL_OIL | Status: DC | PRN

## 2018-04-05 MED ORDER — FENTANYL 2.5 MCG/ML BUPIVACAINE 1/10 % EPIDURAL INFUSION (WH - ANES): 14.0000 mL/h | INTRAMUSCULAR | Status: DC | PRN

## 2018-04-05 MED ORDER — OXYTOCIN BOLUS FROM INFUSION
500.0000 mL | Freq: Once | INTRAVENOUS | Status: AC
  Administered 2018-04-05: 500 mL via INTRAVENOUS

## 2018-04-05 MED ORDER — OXYTOCIN 40 UNITS IN LACTATED RINGERS INFUSION - SIMPLE MED
2.5000 [IU]/h | INTRAVENOUS | Status: DC
  Administered 2018-04-05: 2.5 [IU]/h via INTRAVENOUS
  Filled 2018-04-05: qty 1000

## 2018-04-05 MED ORDER — OXYCODONE-ACETAMINOPHEN 5-325 MG PO TABS: 2.0000 | ORAL_TABLET | ORAL | Status: DC | PRN

## 2018-04-05 MED ORDER — DIPHENHYDRAMINE HCL 50 MG/ML IJ SOLN: 12.5000 mg | INTRAMUSCULAR | Status: DC | PRN

## 2018-04-05 MED ORDER — WITCH HAZEL-GLYCERIN EX PADS: 1.0000 "application " | MEDICATED_PAD | CUTANEOUS | Status: DC | PRN

## 2018-04-05 MED ORDER — DIPHENHYDRAMINE HCL 25 MG PO CAPS: 25.0000 mg | ORAL_CAPSULE | Freq: Four times a day (QID) | ORAL | Status: DC | PRN

## 2018-04-05 MED ORDER — IBUPROFEN 600 MG PO TABS
600.0000 mg | ORAL_TABLET | Freq: Once | ORAL | Status: AC
  Administered 2018-04-05: 600 mg via ORAL
  Filled 2018-04-05: qty 1

## 2018-04-05 MED ORDER — IBUPROFEN 600 MG PO TABS
600.0000 mg | ORAL_TABLET | Freq: Four times a day (QID) | ORAL | Status: DC
  Administered 2018-04-05 – 2018-04-07 (×6): 600 mg via ORAL
  Filled 2018-04-05 (×6): qty 1

## 2018-04-05 NOTE — H&P (Signed)
Phyllis Dalton is a 27 y.o. female presenting for labor evaluation.  She had no cervical change.  BPP 6/10 so admitted for induction OB History    Gravida  3   Para  2   Term  2   Preterm  0   AB  0   Living  2     SAB  0   TAB  0   Ectopic  0   Multiple  0   Live Births  2          Past Medical History:  Diagnosis Date  . Depression   . Diabetes mellitus without complication ([place])   . Gestational diabetes   . Obesity   . Panic attack   . Thrombocytosis ([place]) 09/20/2017  . Trichomonal vaginitis    Past Surgical History:  Procedure Laterality Date  . MOUTH SURGERY    . TONSILLECTOMY    . TONSILLECTOMY AND ADENOIDECTOMY     Family History: family history includes Diabetes in her mother and other; HIV in her mother. Social History:  reports that she quit smoking about 5 years ago. Her smoking use included cigarettes. She has never used smokeless tobacco. She reports that she does not drink alcohol or use drugs.     Maternal Diabetes: No Genetic Screening: Normal Maternal Ultrasounds/Referrals: Normal Fetal Ultrasounds or other Referrals:  None Maternal Substance Abuse:  No Significant Maternal Medications:  None Significant Maternal Lab Results:  None Other Comments:  HSV  MATERNAL OBESITY DEPRESSION NO HI OR SI H/O CHLAMYDIA   ROS History  Physical Examination: General appearance - alert, well appearing, and in no distress Chest - clear to auscultation, no wheezes, rales or rhonchi, symmetric air entry Heart - normal rate and regular rhythm Abdomen - soft, nontender, nondistended, no masses or organomegaly Pelvic - normal external genitalia, vulva, vagina, cervix, uterus and adnexa, no lesions noted on speculum exam Extremities - peripheral pulses normal, no pedal edema, no clubbing or cyanosis, Homan's sign negative bilaterally Dilation: 4 Effacement (%): 70 Station: -2 Exam by:: Dr. Charlesetta Garibaldi Blood pressure 112/62, pulse 85, temperature 98.5  F (36.9 C), temperature source Oral, resp. rate 18, height 5\' 6"  (1.676 m), weight 113.9 kg, last menstrual period 07/05/2017. Exam Physical Exam  Prenatal labs: ABO, Rh: --/--/O POS (08/15 1351) Antibody: NEG (08/15 1351) Rubella:   RPR:    HBsAg: Negative (01/28 1059)  HIV:    GBS:   pos  Assessment/Plan: 38 weeks with non reassuring testing and latent labor Cat 1 now toco q 5 minutes No s/s HSV Pitocin AROM once second dose of PCN in Anticipate SVD   [name],[name] 04/05/2018, 4:17 PM

## 2018-04-05 NOTE — MAU Note (Signed)
Reports some regular ctx and some irregular, no bleeding, no LOF, +FM

## 2018-04-05 NOTE — MAU Note (Signed)
Urine in lab 

## 2018-04-06 ENCOUNTER — Encounter (HOSPITAL_COMMUNITY): Payer: Self-pay | Admitting: Obstetrics & Gynecology

## 2018-04-06 ENCOUNTER — Encounter (HOSPITAL_COMMUNITY)
Admission: AD | Disposition: A | Discharge status: Home / Self Care | Payer: Self-pay | Source: Home / Self Care | Attending: Obstetrics and Gynecology

## 2018-04-06 ENCOUNTER — Inpatient Hospital Stay (HOSPITAL_COMMUNITY): Payer: Medicaid Other | Admitting: Anesthesiology

## 2018-04-06 HISTORY — PX: TUBAL LIGATION: SHX77

## 2018-04-06 LAB — CULTURE, OB URINE: Special Requests: NORMAL

## 2018-04-06 LAB — CBC
HCT: 31.9 % — ABNORMAL LOW (ref 36.0–46.0)
Hemoglobin: 10.7 g/dL — ABNORMAL LOW (ref 12.0–15.0)
MCH: 26 pg (ref 26.0–34.0)
MCHC: 33.5 g/dL (ref 30.0–36.0)
MCV: 77.4 fL — ABNORMAL LOW (ref 78.0–100.0)
Platelets: 390 10*3/uL (ref 150–400)
RBC: 4.12 MIL/uL (ref 3.87–5.11)
RDW: 14 % (ref 11.5–15.5)
WBC: 12.5 10*3/uL — ABNORMAL HIGH (ref 4.0–10.5)

## 2018-04-06 SURGERY — LIGATION, FALLOPIAN TUBE, POSTPARTUM
Anesthesia: Choice

## 2018-04-06 MED ORDER — LACTATED RINGERS IV SOLN
INTRAVENOUS | Status: DC | PRN
  Administered 2018-04-06 (×2): via INTRAVENOUS

## 2018-04-06 MED ORDER — FENTANYL CITRATE (PF) 100 MCG/2ML IJ SOLN
INTRAMUSCULAR | Status: DC | PRN
  Administered 2018-04-06 (×4): 25 ug via INTRAVENOUS

## 2018-04-06 MED ORDER — LIDOCAINE-EPINEPHRINE 1 %-1:100000 IJ SOLN
INTRAMUSCULAR | Status: AC
  Filled 2018-04-06: qty 1

## 2018-04-06 MED ORDER — FENTANYL CITRATE (PF) 100 MCG/2ML IJ SOLN
INTRAMUSCULAR | Status: AC
  Filled 2018-04-06: qty 2

## 2018-04-06 MED ORDER — LIDOCAINE-EPINEPHRINE 1 %-1:100000 IJ SOLN
INTRAMUSCULAR | Status: DC | PRN
  Administered 2018-04-06: 20 mL

## 2018-04-06 MED ORDER — MIDAZOLAM HCL 2 MG/2ML IJ SOLN
INTRAMUSCULAR | Status: DC | PRN
  Administered 2018-04-06 (×4): 1 mg via INTRAVENOUS

## 2018-04-06 MED ORDER — OXYCODONE-ACETAMINOPHEN 5-325 MG PO TABS
1.0000 | ORAL_TABLET | Freq: Four times a day (QID) | ORAL | Status: DC | PRN
  Administered 2018-04-07: 1 via ORAL
  Administered 2018-04-07: 2 via ORAL
  Administered 2018-04-07: 1 via ORAL
  Filled 2018-04-06: qty 2
  Filled 2018-04-06 (×2): qty 1

## 2018-04-06 MED ORDER — LACTATED RINGERS IV SOLN
INTRAVENOUS | Status: DC
  Administered 2018-04-06: 10:00:00 via INTRAVENOUS

## 2018-04-06 MED ORDER — OXYCODONE HCL 5 MG PO TABS
5.0000 mg | ORAL_TABLET | Freq: Four times a day (QID) | ORAL | Status: DC | PRN
  Administered 2018-04-06 (×3): 5 mg via ORAL
  Filled 2018-04-06 (×3): qty 1

## 2018-04-06 MED ORDER — MIDAZOLAM HCL 2 MG/2ML IJ SOLN
INTRAMUSCULAR | Status: AC
  Filled 2018-04-06: qty 2

## 2018-04-06 MED ORDER — LIDOCAINE HCL (CARDIAC) PF 100 MG/5ML IV SOSY
PREFILLED_SYRINGE | INTRAVENOUS | Status: AC
  Filled 2018-04-06: qty 5

## 2018-04-06 MED ORDER — LACTATED RINGERS IV SOLN: INTRAVENOUS | Status: DC

## 2018-04-06 MED ORDER — ONDANSETRON HCL 4 MG/2ML IJ SOLN
INTRAMUSCULAR | Status: AC
  Filled 2018-04-06: qty 2

## 2018-04-06 MED ORDER — FENTANYL CITRATE (PF) 100 MCG/2ML IJ SOLN
25.0000 ug | INTRAMUSCULAR | Status: DC | PRN
  Administered 2018-04-06: 50 ug via INTRAVENOUS
  Administered 2018-04-06: 25 ug via INTRAVENOUS

## 2018-04-06 MED ORDER — METOCLOPRAMIDE HCL 10 MG PO TABS
10.0000 mg | ORAL_TABLET | Freq: Once | ORAL | Status: AC
  Administered 2018-04-06: 10 mg via ORAL
  Filled 2018-04-06: qty 1

## 2018-04-06 MED ORDER — SODIUM CHLORIDE 0.9 % IR SOLN
Status: DC | PRN
  Administered 2018-04-06: 1

## 2018-04-06 MED ORDER — PROPOFOL 10 MG/ML IV BOLUS
INTRAVENOUS | Status: AC
  Filled 2018-04-06: qty 20

## 2018-04-06 MED ORDER — BUPIVACAINE HCL (PF) 0.75 % IJ SOLN
INTRAMUSCULAR | Status: DC | PRN
  Administered 2018-04-06: 1.5 mL via INTRATHECAL

## 2018-04-06 MED ORDER — FAMOTIDINE 20 MG PO TABS
40.0000 mg | ORAL_TABLET | Freq: Once | ORAL | Status: AC
  Administered 2018-04-06: 40 mg via ORAL
  Filled 2018-04-06: qty 2

## 2018-04-06 MED ORDER — CYCLOBENZAPRINE HCL 5 MG PO TABS
5.0000 mg | ORAL_TABLET | Freq: Two times a day (BID) | ORAL | Status: DC | PRN
Start: 2018-04-06 — End: 2018-04-07
  Filled 2018-04-06: qty 1

## 2018-04-06 SURGICAL SUPPLY — 31 items
ADH SKN CLS APL DERMABOND .7 (GAUZE/BANDAGES/DRESSINGS) ×1
APL SKNCLS STERI-STRIP NONHPOA (GAUZE/BANDAGES/DRESSINGS) ×1
BENZOIN TINCTURE PRP APPL 2/3 (GAUZE/BANDAGES/DRESSINGS) ×1 IMPLANT
CLOTH BEACON ORANGE TIMEOUT ST (SAFETY) ×2 IMPLANT
DECANTER SPIKE VIAL GLASS SM (MISCELLANEOUS) ×1 IMPLANT
DERMABOND ADVANCED (GAUZE/BANDAGES/DRESSINGS) ×1
DERMABOND ADVANCED .7 DNX12 (GAUZE/BANDAGES/DRESSINGS) ×1 IMPLANT
DRSG OPSITE POSTOP 3X4 (GAUZE/BANDAGES/DRESSINGS) ×2 IMPLANT
DURAPREP 26ML APPLICATOR (WOUND CARE) ×2 IMPLANT
ELECT REM PT RETURN 9FT ADLT (ELECTROSURGICAL)
ELECTRODE REM PT RTRN 9FT ADLT (ELECTROSURGICAL) IMPLANT
GLOVE BIOGEL PI IND STRL 7.0 (GLOVE) ×2 IMPLANT
GLOVE BIOGEL PI INDICATOR 7.0 (GLOVE) ×2
GLOVE ECLIPSE 6.5 STRL STRAW (GLOVE) ×2 IMPLANT
GOWN STRL REUS W/TWL LRG LVL3 (GOWN DISPOSABLE) ×4 IMPLANT
NEEDLE HYPO 22GX1.5 SAFETY (NEEDLE) ×2 IMPLANT
NS IRRIG 1000ML POUR BTL (IV SOLUTION) ×2 IMPLANT
PACK ABDOMINAL MINOR (CUSTOM PROCEDURE TRAY) ×2 IMPLANT
PENCIL BUTTON HOLSTER BLD 10FT (ELECTRODE) ×2 IMPLANT
PROTECTOR NERVE ULNAR (MISCELLANEOUS) ×2 IMPLANT
SPONGE LAP 4X18 X RAY DECT (DISPOSABLE) ×1 IMPLANT
STRIP CLOSURE SKIN 1/2X4 (GAUZE/BANDAGES/DRESSINGS) ×1 IMPLANT
SUT MON AB 4-0 PS1 27 (SUTURE) ×2 IMPLANT
SUT PLAIN 1 NONE 54 (SUTURE) ×2 IMPLANT
SUT PLAIN 2 0 (SUTURE) ×2
SUT PLAIN ABS 2-0 CT1 27XMFL (SUTURE) IMPLANT
SUT VIC AB 0 CT1 27 (SUTURE) ×2
SUT VIC AB 0 CT1 27XBRD ANBCTR (SUTURE) ×1 IMPLANT
SYR CONTROL 10ML LL (SYRINGE) ×2 IMPLANT
TOWEL OR 17X24 6PK STRL BLUE (TOWEL DISPOSABLE) ×4 IMPLANT
TRAY FOLEY CATH SILVER 14FR (SET/KITS/TRAYS/PACK) ×2 IMPLANT

## 2018-04-06 NOTE — Anesthesia Preprocedure Evaluation (Addendum)
Anesthesia Evaluation  Patient identified by MRN, date of birth, ID band Patient awake    Reviewed: Allergy & Precautions, NPO status , Patient's Chart, lab work & pertinent test results  Airway Mallampati: I  TM Distance: >3 FB Neck ROM: Full    Dental no notable dental hx. (+) Teeth Intact   Pulmonary neg pulmonary ROS, former smoker,    Pulmonary exam normal breath sounds clear to auscultation       Cardiovascular negative cardio ROS Normal cardiovascular exam Rhythm:Regular Rate:Normal     Neuro/Psych Anxiety Depression negative neurological ROS     GI/Hepatic negative GI ROS, Neg liver ROS,   Endo/Other  negative endocrine ROSdiabetes  Renal/GU negative Renal ROS  negative genitourinary   Musculoskeletal negative musculoskeletal ROS (+)   Abdominal   Peds  Hematology negative hematology ROS (+)   Anesthesia Other Findings   Reproductive/Obstetrics Delivered 04/05/18, desires post partum tubal                           Anesthesia Physical Anesthesia Plan  ASA: III  Anesthesia Plan: Spinal   Post-op Pain Management:    Induction:   PONV Risk Score and Plan: 2 and Dexamethasone, Ondansetron and Treatment may vary due to age or medical condition  Airway Management Planned: Natural Airway  Additional Equipment:   Intra-op Plan:   Post-operative Plan:   Informed Consent: I have reviewed the patients History and Physical, chart, labs and discussed the procedure including the risks, benefits and alternatives for the proposed anesthesia with the patient or authorized representative who has indicated his/her understanding and acceptance.     Plan Discussed with: CRNA  Anesthesia Plan Comments:         Anesthesia Quick Evaluation

## 2018-04-06 NOTE — Anesthesia Postprocedure Evaluation (Signed)
Anesthesia Post Note  Patient: Phyllis Dalton  Procedure(s) Performed: POST PARTUM TUBAL LIGATION (N/A )     Patient location during evaluation: PACU Anesthesia Type: Spinal Level of consciousness: oriented and awake and alert Pain management: pain level controlled Vital Signs Assessment: post-procedure vital signs reviewed and stable Respiratory status: spontaneous breathing, respiratory function stable and patient connected to nasal cannula oxygen Cardiovascular status: blood pressure returned to baseline and stable Postop Assessment: no headache, no backache and no apparent nausea or vomiting Anesthetic complications: no    Last Vitals:  Vitals:   04/06/18 1802 04/06/18 1831  BP:  122/81  Pulse:  82  Resp:  17  Temp:  36.8 C  SpO2: 100% 99%    Last Pain:  Vitals:   04/06/18 1831  TempSrc: Oral  PainSc:    Pain Goal:                 Chelsey L Woodrum

## 2018-04-06 NOTE — Interval H&P Note (Signed)
History and Physical Interval Note:  04/06/2018 1:24 PM  Phyllis Dalton  has presented today for surgery, with the diagnosis of add on PP BTL  The various methods of treatment have been discussed with the patient and family. After consideration of risks, benefits and other options for treatment, the patient has consented to  Procedure(s): POST PARTUM TUBAL LIGATION (N/A) as a surgical intervention .  The patient's history has been reviewed, patient examined, no change in status, stable for surgery.  I have reviewed the patient's chart and labs.  Questions were answered to the patient's satisfaction.     Alinda Dooms, MD.

## 2018-04-06 NOTE — Transfer of Care (Signed)
Immediate Anesthesia Transfer of Care Note  Patient: Phyllis Dalton  Procedure(s) Performed: POST PARTUM TUBAL LIGATION (N/A )  Patient Location: PACU  Anesthesia Type:Spinal  Level of Consciousness: sedated  Airway & Oxygen Therapy: Patient Spontanous Breathing and Patient connected to nasal cannula oxygen  Post-op Assessment: Report given to RN and Post -op Vital signs reviewed and stable  Post vital signs: Reviewed and stable  Last Vitals:  Vitals Value Taken Time  BP    Temp    Pulse 83 04/06/2018  4:39 PM  Resp    SpO2 100 % 04/06/2018  4:39 PM  Vitals shown include unvalidated device data.  Last Pain:  Vitals:   04/06/18 1202  TempSrc:   PainSc: 5          Complications: No apparent anesthesia complications

## 2018-04-06 NOTE — H&P (View-Only) (Signed)
Post Partum Day 1 Subjective: no complaints, up ad lib and voiding  Objective: Blood pressure 104/71, pulse 81, temperature 98.2 F (36.8 C), temperature source Oral, resp. rate 18, height 5\' 6"  (1.676 m), weight 113.9 kg, last menstrual period 07/05/2017, SpO2 100 %, unknown if currently breastfeeding.  Physical Exam:  General: alert, cooperative and appears stated age Lochia: appropriate Uterine Fundus: firm Incision:  DVT Evaluation: No evidence of DVT seen on physical exam.  Recent Labs    04/05/18 1351 04/06/18 0535  HGB 11.1* 10.7*  HCT 33.7* 31.9*    Assessment/Plan: Routine PP orders NPO BTL at 400pm   LOS: 1 day   Lori A Clemmons CNM 04/06/2018, 10:16 AM   I saw and examined patient at bedside and agree with above findings, assessment and plan.  She desires post partum bilateral tubal ligation via bilateral partial salpingectomy.  Bilateral tubal ligation to be perfomed via partial bilateral salpingectomy- modified Pomeroy method.  We discussed risks, benefits and alternatives of postpartum tubal ligation including but not limited to risks of bleeding, infection and damage to organs.  She also understood the risk of tubal regret but she states she is 100% sure she did not want any more children.  We discussed that the tubal ligation has a small risk of failure of less than 1% with an accompanying increased risk of ectopic pregnancy incase of failure.  She understood that there was an option for complete salpingectomy which could also potentially lower her future risk of ovarian and tubal cancer but she did not desire this option.  She understood that there were other kinds of birth control such as pills, patches, IUDs, vaginal rings and depo provera which were temporary but she did not desire them.  She understood there was also an option of female sterilization but she did not desire that option either.  She did not desire tubal fulguration or placement of tubal clips or  Essure sterilization.  All her questions were answered and she was consented for the procedure.  She expressed understanding that if she does have irregular or heavy bleeding after the sterilization she may require hormone control in the form of birth control devices or medication to regulate her cycles.    Dr. Alesia Richards.

## 2018-04-06 NOTE — Progress Notes (Signed)
CLINICAL SOCIAL WORK MATERNAL/CHILD NOTE  Patient Details  Name: Phyllis Dalton MRN: [id number] Date of Birth: 04/05/2019  Date:  04/06/2018  Clinical Social Worker Initiating Note:  Angel Boyd-Gilyard Date/Time: Initiated:  04/06/18/0914     Child's Name:  Phyllis Dalton   Biological Parents:  Mother(Per MOB, FOB is prison and is scheduled to be release in 14 months. FOB is Christopher Bell (08/28/1981))   Need for Interpreter:  None   Reason for Referral:  Current Substance Use/Substance Use During Pregnancy (hx of marijuana use. )   Address:  [address] [place] [address]    Phone number:  [phone] (home)     Additional phone number:   Household Members/Support Persons (HM/SP):   Household Member/Support Person 1   HM/SP Name Relationship DOB or Age  HM/SP -1 Kemani Wiginton daughter 05/26/09  HM/SP -2 Karson Trantham son 08/24/13  HM/SP -3        HM/SP -4        HM/SP -5        HM/SP -6        HM/SP -7        HM/SP -8          Natural Supports (not living in the home):  Immediate Family, Friends, Extended Family   Professional Supports: None   Employment: Unemployed   Type of Work:     Education:  High school graduate   Homebound arranged:    Financial Resources:  Medicaid   Other Resources:  WIC, Food Stamps    Cultural/Religious Considerations Which May Impact Care:  Per MOB's Face Sheet, MOB is Non-Denominational  Strengths:  Ability to meet basic needs , Home prepared for child , Pediatrician chosen, Understanding of illness   Psychotropic Medications:         Pediatrician:    [place] area  Pediatrician List:   [facility]  [place]    [place]    [place]    [place]    [place]      Pediatrician Fax Number:    Risk Factors/Current Problems:  Mental Health Concerns , Substance Use    Cognitive State:  Able to Concentrate , Alert , Insightful , Linear  Thinking    Mood/Affect:  Calm , Comfortable , Happy , Relaxed , Interested    CSW Assessment:  CSW met with MOB in room 111 ro complete an assessment for mother's history of depression and marijuana use.   Mother was receptive to social work intervention.  She is a single parent with 2 older children.  Mother reports hx of depression in 2014 and denies any signs or symtoms since inpatient stay in 2014. MOB informed CSW that she was under significant stress during the pregnancy and was hospitalized 3 days for suicide Ideation August 2014.   She denies any other hx of psychiatric treatment.  Mother states that she was referred for outpatient services but didn't follow through because she felt much better.  She denies any SI, HI, or current symptoms of depression or anxiety.  CSW provided education regarding the baby blues period vs. perinatal mood disorders, discussed treatment and gave resources for mental health follow up if concerns arise.  CSW recommends self-evaluation during the postpartum time period using the New Mom Checklist from Postpartum Progress and encouraged MOB to contact a medical professional if symptoms are noted at any time.    CSW asked about MOB's SA hx and MOB openly shared the   use of marijuana throughout her pregnancy to assist with MOB's nausea. MOB reported MOB 's last use was February 22, 2018. CSW informed MOB of the hospital's drug screen policy. Infant's UDS was negative and CSW will contiue to monitor infants CDS.  MOB is aware that CSW will make a report to CPS if warranted.   MOB reports having a good support team and feeling prepared to parent.    CSW Plan/Description:  No Further Intervention Required/No Barriers to Discharge, Sudden Infant Death Syndrome (SIDS) Education, Perinatal Mood and Anxiety Disorder (PMADs) Education, Hospital Drug Screen Policy Information, CSW Will Continue to Monitor Umbilical Cord Tissue Drug Screen Results and Make Report if Warranted,  Other Information/Referral to Community Resources   Angel Boyd-Gilyard, MSW, LCSW Clinical Social Work [phone]   ANGEL D BOYD-GILYARD, LCSW 04/06/2018, 12:21 PM  

## 2018-04-06 NOTE — Op Note (Signed)
  Patient: Phyllis Dalton, Phyllis Dalton DOB: Apr 28, 1991  MRN: [id number]  PREOP DIAGNOSIS:  Multiparous patient (27 year old para 3) desiring permanent sterilization.  POSTOP DIAGNOSIS: Same as above  PROCEDURE: Post partum bilateral tubal ligation via modified Pomeroy Method.  SURGEON: Dr. Waymon Amato  ASSISTANT: Yvonne Kendall, CNM  ANESTHESIA: Spinal.  COMPLICATIONS: None  PQZ:[id number]   IV FLUID: per anesthesia  FINDINGS: Normal fallopian tubes bilaterally.     PROCEDURE:  Informed consent was obtained from the patient to undergo the procedure after discussing the risks benefits and alternatives of the procedure including risks of tubal regret, bleeding, infection and damage to organs.  She also understood that there are other temporary methods of birth control, and also female sterilization and she did not desire these other options.  She understood there was a small risk of failure with an accompanying increased risk of ectopic pregnancy in case of failure.    She was taken to the operating room where anesthesia was administered without difficulty.  She was prepped abdominally in the usual sterile fashion. Foley catheter was placed in the bladder.   1% lidocaine with epinephrine was instilled in the infraumbilical area. The skin was incised with a scalpel about 3 cm, and and entry into the abdomen was made through the subcutaneous layer, fascia and peritoneal layers.  The patient was tilted to her right side and the left fallopian tube was identified and followed up to the fimbria end through the incision. Access to the left tube was limited by body habitus and bowel had to be packed away with 4 moist laps to access the area.   A portion of the tube, about 3cm from the uterine cornua was isolated with 2  free ties of 1-0 plain, bottom tie was about 70mm below the first one.  The mesosalpinx was entered with metzenbaum and the tube transected, the portion of tube transected was sent to pathology.  Excellent hemostasis was noted over the tube ends.  A similar procedure was done to remove a portion of the right tube after tilting patient to her left.  Excellent hemostasis was noted over the tubal ends.  The tubes were noted to appear somewhat short in general.  Laps and instruments were removed.  The fascia was then closed using 0 Vicryl in a running stitch.  The skin was closed using 4-0 Monocryl subcuticular stitch. More 1% lidocaine with epi was instilled over the incision.  Steristrips and Honey comb dressing were placed. The instrument and needle counts were correct. She was taken to the recovery room in a stable condition.  SPECIMEN:  Portions of Left and right fallopian tubes  Dr. Waymon Amato.   Date: 04/06/2018 Time: 2028.

## 2018-04-06 NOTE — Lactation Note (Signed)
This note was copied from a baby's chart. Lactation Consultation Note  Patient Name: Phyllis Dalton NGBMB'O Date: 04/06/2018 Reason for consult: Initial assessment;Early term 37-38.6wks Breastfeeding consultation services and support information given to patient.  This is mom's third baby and she breastfed her previous babies the last for 2 years.  Mom reports that the baby is latching easily and she is comfortable.  Instructed to feed with any feeding cue and call for assist/concerns prn.  Maternal Data    Feeding    LATCH Score                   Interventions    Lactation Tools Discussed/Used     Consult Status Consult Status: Follow-up Date: 04/07/18 Follow-up type: In-patient    Ave Filter 04/06/2018, 1:37 PM

## 2018-04-06 NOTE — Anesthesia Procedure Notes (Signed)
Spinal  Patient location during procedure: OR Start time: 04/06/2018 3:10 PM End time: 04/06/2018 3:20 PM Staffing Anesthesiologist: Freddrick March, MD Performed: anesthesiologist  Preanesthetic Checklist Completed: patient identified, surgical consent, pre-op evaluation, timeout performed, IV checked, risks and benefits discussed and monitors and equipment checked Spinal Block Patient position: sitting Prep: site prepped and draped and DuraPrep Patient monitoring: cardiac monitor, continuous pulse ox and blood pressure Approach: midline Location: L3-4 Injection technique: single-shot Needle Needle type: Pencan  Needle gauge: 24 G Needle length: 9 cm Assessment Sensory level: T6 Additional Notes Functioning IV was confirmed and monitors were applied. Sterile prep and drape, including hand hygiene and sterile gloves were used. The patient was positioned and the spine was prepped. The skin was anesthetized with lidocaine.  Free flow of clear CSF was obtained prior to injecting local anesthetic into the CSF.  The spinal needle aspirated freely following injection.  The needle was carefully withdrawn.  The patient tolerated the procedure well.

## 2018-04-06 NOTE — Progress Notes (Addendum)
Post Partum Day 1 Subjective: no complaints, up ad lib and voiding  Objective: Blood pressure 104/71, pulse 81, temperature 98.2 F (36.8 C), temperature source Oral, resp. rate 18, height 5\' 6"  (1.676 m), weight 113.9 kg, last menstrual period 07/05/2017, SpO2 100 %, unknown if currently breastfeeding.  Physical Exam:  General: alert, cooperative and appears stated age Lochia: appropriate Uterine Fundus: firm Incision:  DVT Evaluation: No evidence of DVT seen on physical exam.  Recent Labs    04/05/18 1351 04/06/18 0535  HGB 11.1* 10.7*  HCT 33.7* 31.9*    Assessment/Plan: Routine PP orders NPO BTL at 400pm   LOS: 1 day   Lori A Clemmons CNM 04/06/2018, 10:16 AM   I saw and examined patient at bedside and agree with above findings, assessment and plan.  She desires post partum bilateral tubal ligation via bilateral partial salpingectomy.  Bilateral tubal ligation to be perfomed via partial bilateral salpingectomy- modified Pomeroy method.  We discussed risks, benefits and alternatives of postpartum tubal ligation including but not limited to risks of bleeding, infection and damage to organs.  She also understood the risk of tubal regret but she states she is 100% sure she did not want any more children.  We discussed that the tubal ligation has a small risk of failure of less than 1% with an accompanying increased risk of ectopic pregnancy incase of failure.  She understood that there was an option for complete salpingectomy which could also potentially lower her future risk of ovarian and tubal cancer but she did not desire this option.  She understood that there were other kinds of birth control such as pills, patches, IUDs, vaginal rings and depo provera which were temporary but she did not desire them.  She understood there was also an option of female sterilization but she did not desire that option either.  She did not desire tubal fulguration or placement of tubal clips or  Essure sterilization.  All her questions were answered and she was consented for the procedure.  She expressed understanding that if she does have irregular or heavy bleeding after the sterilization she may require hormone control in the form of birth control devices or medication to regulate her cycles.    Dr. Alesia Richards.

## 2018-04-06 NOTE — Brief Op Note (Signed)
04/06/2018  8:19 PM  PATIENT:  Phyllis Dalton  27 y.o. female  PRE-OPERATIVE DIAGNOSIS:   PP BTL  POST-OPERATIVE DIAGNOSIS:   PP BTL  PROCEDURE:  Procedure(s): POST PARTUM TUBAL LIGATION (N/A)  SURGEON:  Surgeon(s) and Role:    * Waymon Amato, MD - Primary  ASSISTANTS: Yvonne Kendall, CNM   ANESTHESIA:   spinal  EBL:  10 mL   BLOOD ADMINISTERED:none  DRAINS: none   LOCAL MEDICATIONS USED:  LIDOCAINE with epinephrine, Amount: 10 ml  SPECIMEN:  Portions of left and right fallopian tubes.   DISPOSITION OF SPECIMEN:  PATHOLOGY  COUNTS:  YES  TOURNIQUET:  * No tourniquets in log *  DICTATION: .Note written in EPIC  PLAN OF CARE: Admit to inpatient   PATIENT DISPOSITION:  PACU - hemodynamically stable.   Delay start of Pharmacological VTE agent (>24hrs) due to surgical blood loss or risk of bleeding: not applicable  Dr. Alesia Richards 3/64/68 at 1700.

## 2018-04-06 NOTE — Progress Notes (Signed)
Patient states pain of 9 after tylenol, motrin and heat packs. Provider notified. Phyllis Dalton, CNM ordered oxycodone 5mg  Q6. Confirmed that patient is getting a BTL tomorrow. The time will be determined in the AM. Patient has been NPO since midnight.   [name], RN

## 2018-04-07 MED ORDER — IBUPROFEN 600 MG PO TABS: 600.0000 mg | ORAL_TABLET | Freq: Four times a day (QID) | ORAL | 0 refills | Status: DC | PRN

## 2018-04-07 NOTE — Discharge Instructions (Signed)
Postpartum Care After Vaginal Delivery °The period of time right after you deliver your newborn is called the postpartum period. °What kind of medical care will I receive? °· You may continue to receive fluids and medicines through an IV tube inserted into one of your veins. °· If an incision was made near your vagina (episiotomy) or if you had some vaginal tearing during delivery, cold compresses may be placed on your episiotomy or your tear. This helps to reduce pain and swelling. °· You may be given a squirt bottle to use when you go to the bathroom. You may use this until you are comfortable wiping as usual. To use the squirt bottle, follow these steps: °? Before you urinate, fill the squirt bottle with warm water. Do not use hot water. °? After you urinate, while you are sitting on the toilet, use the squirt bottle to rinse the area around your urethra and vaginal opening. This rinses away any urine and blood. °? You may do this instead of wiping. As you start healing, you may use the squirt bottle before wiping yourself. Make sure to wipe gently. °? Fill the squirt bottle with clean water every time you use the bathroom. °· You will be given sanitary pads to wear. °How can I expect to feel? °· You may not feel the need to urinate for several hours after delivery. °· You will have some soreness and pain in your abdomen and vagina. °· If you are breastfeeding, you may have uterine contractions every time you breastfeed for up to several weeks postpartum. Uterine contractions help your uterus return to its normal size. °· It is normal to have vaginal bleeding (lochia) after delivery. The amount and appearance of lochia is often similar to a menstrual period in the first week after delivery. It will gradually decrease over the next few weeks to a dry, yellow-brown discharge. For most women, lochia stops completely by 6-8 weeks after delivery. Vaginal bleeding can vary from woman to woman. °· Within the first few  days after delivery, you may have breast engorgement. This is when your breasts feel heavy, full, and uncomfortable. Your breasts may also throb and feel hard, tightly stretched, warm, and tender. After this occurs, you may have milk leaking from your breasts. Your health care provider can help you relieve discomfort due to breast engorgement. Breast engorgement should go away within a few days. °· You may feel more sad or worried than normal due to hormonal changes after delivery. These feelings should not last more than a few days. If these feelings do not go away after several days, speak with your health care provider. °How should I care for myself? °· Tell your health care provider if you have pain or discomfort. °· Drink enough water to keep your urine clear or pale yellow. °· Wash your hands thoroughly with soap and water for at least 20 seconds after changing your sanitary pads, after using the toilet, and before holding or feeding your baby. °· If you are not breastfeeding, avoid touching your breasts a lot. Doing this can make your breasts produce more milk. °· If you become weak or lightheaded, or you feel like you might faint, ask for help before: °? Getting out of bed. °? Showering. °· Change your sanitary pads frequently. Watch for any changes in your flow, such as a sudden increase in volume, a change in color, the passing of large blood clots. If you pass a blood clot from your vagina, save it   to show to your health care provider. Do not flush blood clots down the toilet without having your health care provider look at them.  Make sure that all your vaccinations are up to date. This can help protect you and your baby from getting certain diseases. You may need to have immunizations done before you leave the hospital.  If desired, talk with your health care provider about methods of family planning or birth control (contraception). How can I start bonding with my baby? Spending as much time as  possible with your baby is very important. During this time, you and your baby can get to know each other and develop a bond. Having your baby stay with you in your room (rooming in) can give you time to get to know your baby. Rooming in can also help you become comfortable caring for your baby. Breastfeeding can also help you bond with your baby. How can I plan for returning home with my baby?  Make sure that you have a car seat installed in your vehicle. ? Your car seat should be checked by a certified car seat installer to make sure that it is installed safely. ? Make sure that your baby fits into the car seat safely.  Ask your health care provider any questions you have about caring for yourself or your baby. Make sure that you are able to contact your health care provider with any questions after leaving the hospital. This information is not intended to replace advice given to you by your health care provider. Make sure you discuss any questions you have with your health care provider. Document Released: 06/05/2007 Document Revised: 01/11/2016 Document Reviewed: 07/13/2015 Elsevier Interactive Patient Education  2018 [place] Depression When a woman feels excessive sadness, anger, or anxiety during pregnancy or during the first 12 months after she gives birth, she has a condition called perinatal depression. Depression can interfere with work, school, relationships, and other everyday activities. If it is not managed properly, it can also cause problems in the mother and her baby. Sometimes, perinatal depression is left untreated because symptoms are thought to be normal mood swings during and right after pregnancy. If you have symptoms of depression, it is important to talk with your health care provider. What are the causes? The exact cause of this condition is not known. Hormonal changes during and after pregnancy may play a role in causing perinatal depression. What  increases the risk? You are more likely to develop this condition if:  You have a personal or family history of depression, anxiety, or mood disorders.  You experience a stressful life event during pregnancy, such as the death of a loved one.  You have a lot of regular life stress.  You do not have support from family members or loved ones, or you are in an abusive relationship.  What are the signs or symptoms? Symptoms of this condition include:  Feeling sad or hopeless.  Feelings of guilt.  Feeling irritable or overwhelmed.  Changes in your appetite.  Lack of energy or motivation.  Sleep problems.  Difficulty concentrating or completing tasks.  Loss of interest in hobbies or relationships.  Headaches or stomach problems that do not go away.  How is this diagnosed? This condition is diagnosed based on a physical exam and mental evaluation. In some cases, your health care provider may use a depression screening tool. These tools include a list of questions that can help a health care provider diagnose  depression. Your health care provider may refer you to a mental health expert who specializes in depression. How is this treated? This condition may be treated with:  Medicines. Your health care provider will only give you medicines that have been proven safe for pregnancy and breastfeeding.  Talk therapy with a mental health professional to help change your patterns of thinking (cognitive behavioral therapy).  Support groups.  Brain stimulation or light therapies.  Stress reduction therapies, such as mindfulness.  Follow these instructions at home: Lifestyle  Do not use any products that contain nicotine or tobacco, such as cigarettes and e-cigarettes. If you need help quitting, ask your health care provider.  Do not use alcohol when you are pregnant. After your baby is born, limit alcohol intake to no more than 1 drink a day. One drink equals 12 oz of beer, 5 oz of  wine, or 1 oz of hard liquor.  Consider joining a support group for new mothers. Ask your health care provider for recommendations.  Take good care of yourself. Make sure you: ? Get plenty of sleep. If you are having trouble sleeping, talk with your health care provider. ? Eat a healthy diet. This includes plenty of fruits and vegetables, whole grains, and lean proteins. ? Exercise regularly, as told by your health care provider. Ask your health care provider what exercises are safe for you. General instructions  Take over-the-counter and prescription medicines only as told by your health care provider.  Talk with your partner or family members about your feelings during pregnancy. Share any concerns or anxieties that you may have.  Ask for help with tasks or chores when you need it. Ask friends and family members to provide meals, watch your children, or help with cleaning.  Keep all follow-up visits as told by your health care provider. This is important. Contact a health care provider if:  You (or people close to you) notice that you have any symptoms of depression.  You have depression and your symptoms get worse.  You experience side effects from medicines, such as nausea or sleep problems. Get help right away if:  You feel like hurting yourself, your baby, or someone else. If you ever feel like you may hurt yourself or others, or have thoughts about taking your own life, get help right away. You can go to your nearest emergency department or call:  Your local emergency services (911 in the [place]  A suicide crisis helpline, such as the [place] at 747 661 1443. This is open 24 hours a day.  Summary  Perinatal depression is when a woman feels excessive sadness, anger, or anxiety during pregnancy or during the first 12 months after she gives birth.  If perinatal depression is not treated, it can lead to health problems for the mother and her  baby.  This condition is treated with medicines, talk therapy, stress reduction therapies, or a combination of two or more treatments.  Talk with your partner or family members about your feelings. Do not be afraid to ask for help. This information is not intended to replace advice given to you by your health care provider. Make sure you discuss any questions you have with your health care provider. Document Released: 10/05/2016 Document Revised: 10/05/2016 Document Reviewed: 10/05/2016 Elsevier Interactive Patient Education  2018 [place] When a woman feels excessive tension or worry (anxiety) during pregnancy or during the first 12 months after she gives birth, she has a condition  called perinatal anxiety. Anxiety can interfere with work, school, relationships, and other everyday activities. If it is not managed properly, it can also cause problems in the mother and her baby.  If you are pregnant and you have symptoms of an anxiety disorder, it is important to talk with your health care provider. What are the causes? The exact cause of this condition is not known. Hormonal changes during and after pregnancy may play a role in causing perinatal anxiety. What increases the risk? You are more likely to develop this condition if:  You have a personal or family history of depression, anxiety, or mood disorders.  You experience a stressful life event during pregnancy, such as the death of a loved one.  You have a lot of regular life stress, such as being a single parent.  You have thyroid problems.  What are the signs or symptoms? Perinatal anxiety can be different for everyone. It may include:  Panic attacks (panic disorder). These are intense episodes of fear or discomfort that may also cause sweating, nausea, shortness of breath, or fear of dying. They usually last 5-15 minutes.  Reliving an upsetting (traumatic) event through distressing thoughts, dreams, or  flashbacks (post-traumatic stress disorder, or PTSD).  Excessive worry about multiple problems (generalized anxiety disorder).  Fear and stress about leaving certain people or loved ones (separation anxiety).  Performing repetitive tasks (compulsions) to relieve stress or worry (obsessive compulsive disorder, or OCD).  Fear of certain objects or situations (phobias).  Excessive worrying, such as a constant feeling that something bad is going to happen.  Inability to relax.  Difficulty concentrating.  Sleep problems.  Frequent nightmares or disturbing thoughts.  How is this diagnosed? This condition is diagnosed based on a physical exam and mental evaluation. In some cases, your health care provider may use an anxiety screening tool. These tools include a list of questions that can help a health care provider diagnose anxiety. Your health care provider may refer you to a mental health expert who specializes in anxiety. How is this treated? This condition may be treated with:  Medicines. Your health care provider will only give you medicines that have been proven safe for pregnancy and breastfeeding.  Talk therapy with a mental health professional to help change your patterns of thinking (cognitive behavioral therapy).  Mindfulness-based stress reduction.  Other relaxation therapies, such as deep breathing or guided muscle relaxation.  Support groups.  Follow these instructions at home: Lifestyle  Do not use any products that contain nicotine or tobacco, such as cigarettes and e-cigarettes. If you need help quitting, ask your health care provider.  Do not use alcohol when you are pregnant. After your baby is born, limit alcohol intake to no more than 1 drink a day. One drink equals 12 oz of beer, 5 oz of wine, or 1 oz of hard liquor.  Consider joining a support group for new mothers. Ask your health care provider for recommendations.  Take good care of yourself. Make sure  you: ? Get plenty of sleep. If you are having trouble sleeping, talk with your health care provider. ? Eat a healthy diet. This includes plenty of fruits and vegetables, whole grains, and lean proteins. ? Exercise regularly, as told by your health care provider. Ask your health care provider what exercises are safe for you. General instructions  Take over-the-counter and prescription medicines only as told by your health care provider.  Talk with your partner or family members about your feelings during  pregnancy. Share any concerns or fears that you may have.  Ask for help with tasks or chores when you need it. Ask friends and family members to provide meals, watch your children, or help with cleaning.  Keep all follow-up visits as told by your health care provider. This is important. Contact a health care provider if:  You (or people close to you) notice that you have any symptoms of anxiety or depression.  You have anxiety and your symptoms get worse.  You experience side effects from medicines, such as nausea or sleep problems. Get help right away if:  You feel like hurting yourself, your baby, or someone else. If you ever feel like you may hurt yourself or others, or have thoughts about taking your own life, get help right away. You can go to your nearest emergency department or call:  Your local emergency services (911 in the [place]  A suicide crisis helpline, such as the [place] at (865)564-5953. This is open 24 hours a day.  Summary  Perinatal anxiety is when a woman feels excessive tension or worry during pregnancy or during the first 12 months after she gives birth.  Perinatal anxiety may include panic attacks, post-traumatic stress disorder, separation anxiety, phobias, or generalized anxiety.  Perinatal anxiety can cause physical health problems in the mother and baby if not properly managed.  This condition is treated with medicines,  talk therapy, stress reduction therapies, or a combination of two or more treatments.  Talk with your partner or family members about your concerns or fears. Do not be afraid to ask for help. This information is not intended to replace advice given to you by your health care provider. Make sure you discuss any questions you have with your health care provider. Document Released: 10/05/2016 Document Revised: 10/05/2016 Document Reviewed: 10/05/2016 Elsevier Interactive Patient Education  2018 [employer].   Contraception Choices Contraception, also called birth control, means things to use or ways to try not to get pregnant. Hormonal birth control This kind of birth control uses hormones. Here are some types of hormonal birth control:  A tube that is put under skin of the arm (implant). The tube can stay in for as long as 3 years.  Shots to get every 3 months (injections).  Pills to take every day (birth control pills).  A patch to change 1 time each week for 3 weeks (birth control patch). After that, the patch is taken off for 1 week.  A ring to put in the vagina. The ring is left in for 3 weeks. Then it is taken out of the vagina for 1 week. Then a new ring is put in.  Pills to take after unprotected sex (emergency birth control pills).  Barrier birth control Here are some types of barrier birth control:  A thin covering that is put on the penis before sex (female condom). The covering is thrown away after sex.  A soft, loose covering that is put in the vagina before sex (female condom). The covering is thrown away after sex.  A rubber bowl that sits over the cervix (diaphragm). The bowl must be made for you. The bowl is put into the vagina before sex. The bowl is left in for 6-8 hours after sex. It is taken out within 24 hours.  A small, soft cup that fits over the cervix (cervical cap). The cup must be made for you. The cup can be left in for 6-8 hours after sex.  It is taken out  within 48 hours.  A sponge that is put into the vagina before sex. It must be left in for at least 6 hours after sex. It must be taken out within 30 hours. Then it is thrown away.  A chemical that kills or stops sperm from getting into the uterus (spermicide). It may be a pill, cream, jelly, or foam to put in the vagina. The chemical should be used at least 10-15 minutes before sex.  IUD (intrauterine) birth control An IUD is a small, T-shaped piece of plastic. It is put inside the uterus. There are two kinds:  Hormone IUD. This kind can stay in for 3-5 years.  Copper IUD. This kind can stay in for 10 years.  Permanent birth control Here are some types of permanent birth control:  Surgery to block the fallopian tubes.  Having an insert put into each fallopian tube.  Surgery to tie off the tubes that carry sperm (vasectomy).  Natural planning birth control Here are some types of natural planning birth control:  Not having sex on the days the woman could get pregnant.  Using a calendar: ? To keep track of the length of each period. ? To find out what days pregnancy can happen. ? To plan to not have sex on days when pregnancy can happen.  Watching for symptoms of ovulation and not having sex during ovulation. One way the woman can check for ovulation is to check her temperature.  Waiting to have sex until after ovulation.  Summary  Contraception, also called birth control, means things to use or ways to try not to get pregnant.  Hormonal methods of birth control include implants, injections, pills, patches, vaginal rings, and emergency birth control pills.  Barrier methods of birth control can include female condoms, female condoms, diaphragms, cervical caps, sponges, and spermicides.  There are two types of IUD (intrauterine device) birth control. An IUD can be put in a woman's uterus to prevent pregnancy for 3-5 years.  Permanent sterilization can be done through a  procedure for males, females, or both.  Natural planning methods involve not having sex on the days when the woman could get pregnant. This information is not intended to replace advice given to you by your health care provider. Make sure you discuss any questions you have with your health care provider. Document Released: 06/05/2009 Document Revised: 08/18/2016 Document Reviewed: 08/18/2016 Elsevier Interactive Patient Education  2017 [employer].

## 2018-04-07 NOTE — Addendum Note (Signed)
Addendum  created 04/07/18 3225 by Hewitt Blade, CRNA   Sign clinical note

## 2018-04-07 NOTE — Lactation Note (Signed)
This note was copied from a baby's chart. Lactation Consultation Note  Patient Name: Phyllis Dalton OOILN'Z Date: 04/07/2018 Reason for consult: Follow-up assessment;NICU baby;Hyperbilirubinemia Baby was transferred to NICU this morning.  Symphony pump initiated.  Instructed to pump 8-12 times in 24 hours . [facility] loaner discussed for discharge.  Providing Breastmilk For Your Baby In NICU book left with mom.  Encouraged to call for assist prn.  Maternal Data    Feeding Feeding Type: Bottle Fed - Formula Nipple Type: Slow - flow  LATCH Score                   Interventions    Lactation Tools Discussed/Used WIC Program: Yes Pump Review: Setup, frequency, and cleaning;Milk Storage Initiated by:: LM Date initiated:: 04/07/18   Consult Status      MOULDEN, LAURA S 04/07/2018, 11:01 AM

## 2018-04-07 NOTE — Anesthesia Postprocedure Evaluation (Signed)
Anesthesia Post Note  Patient: Phyllis Dalton  Procedure(s) Performed: POST PARTUM TUBAL LIGATION (N/A )     Patient location during evaluation: Mother Baby Anesthesia Type: Spinal Level of consciousness: awake and alert Pain management: pain level controlled Vital Signs Assessment: post-procedure vital signs reviewed and stable Respiratory status: spontaneous breathing, nonlabored ventilation and respiratory function stable Cardiovascular status: stable Postop Assessment: no headache, no backache, spinal receding, able to ambulate, adequate PO intake, no apparent nausea or vomiting and patient able to bend at knees Anesthetic complications: no    Last Vitals:  Vitals:   04/06/18 2313 04/07/18 0340  BP: (!) 94/50 106/63  Pulse: 97 62  Resp:  18  Temp: 36.8 C 36.6 C  SpO2: 100% 99%    Last Pain:  Vitals:   04/07/18 0647  TempSrc:   PainSc: 8    Pain Goal:                 Jabier Mutton

## 2018-04-07 NOTE — Discharge Summary (Signed)
OB Discharge Summary     Patient Name: Phyllis Dalton DOB: Dec 17, 1990 MRN: [id number]  Date of admission: 04/05/2018 Delivering MD: Julianne Handler   Date of discharge: 04/07/2018  Admitting diagnosis: 76WKS LABOR Intrauterine pregnancy: [redacted]w[redacted]d     Secondary diagnosis:  Active Problems:   Prolonged latent phase of labor   SVD (spontaneous vaginal delivery)  Additional problems: Anxiety and depression in pregnancy     Discharge diagnosis: Term Pregnancy Delivered                                                                                                Post partum procedures:n/a  Augmentation: n/a  Complications: None  Hospital course:  Onset of Labor With Vaginal Delivery     27 y.o. yo G3P3003 at [redacted]w[redacted]d was admitted in Active Labor on 04/05/2018. Patient had an uncomplicated labor course as follows:  Membrane Rupture Time/Date: 6:09 PM ,04/05/2018   Intrapartum Procedures: Episiotomy: None [1]                                         Lacerations:  None [1]  Patient had a delivery of a Viable infant. 04/05/2018  Information for the patient's newborn:  Ahmyah, Gidley [[id number]]  Delivery Method: Vaginal, Spontaneous(Filed from Delivery Summary)    Pateint had an uncomplicated postpartum course.  She is ambulating, tolerating a regular diet, passing flatus, and urinating well. Patient is discharged home in stable condition on 04/07/18.   Physical exam  Vitals:   04/06/18 2219 04/06/18 2313 04/07/18 0340 04/07/18 0815  BP: 113/72 (!) 94/50 106/63 128/78  Pulse: 93 97 62 84  Resp: 18  18 18   Temp:  98.2 F (36.8 C) 97.8 F (36.6 C) 97.8 F (36.6 C)  TempSrc:  Oral Oral Oral  SpO2:  100% 99%   Weight:      Height:       General: alert, cooperative and no distress Lochia: appropriate Uterine Fundus: firm Incision: N/A DVT Evaluation: No evidence of DVT seen on physical exam. Negative Homan's sign. No cords or calf tenderness. No significant calf/ankle  edema.  Patient verbalizes she does not currently have any anxiety or depressive symptoms. Discussed the importance of keeping in contact with [facility] if these symptoms develop as we have support and medication we can offer her. Patient verbalized understanding.   Labs: Lab Results  Component Value Date   WBC 12.5 (H) 04/06/2018   HGB 10.7 (L) 04/06/2018   HCT 31.9 (L) 04/06/2018   MCV 77.4 (L) 04/06/2018   PLT 390 04/06/2018   CMP Latest Ref Rng & Units 11/01/2017  Glucose 70 - 140 mg/dL 101  BUN 7 - 26 mg/dL 6(L)  Creatinine 0.60 - 1.10 mg/dL 0.82  Sodium 136 - 145 mmol/L 136  Potassium 3.5 - 5.1 mmol/L 3.7  Chloride 98 - 109 mmol/L 102  CO2 22 - 29 mmol/L 26  Calcium 8.4 - 10.4 mg/dL 9.7  Total Protein 6.4 - 8.3 g/dL 7.5  Total Bilirubin  0.2 - 1.2 mg/dL 0.2  Alkaline Phos 40 - 150 U/L 90  AST 5 - 34 U/L 15  ALT 0 - 55 U/L 17    Discharge instruction: per After Visit Summary and "Baby and Me Booklet".  After visit meds:  Allergies as of 04/07/2018   No Known Allergies     Medication List    STOP taking these medications   metroNIDAZOLE 0.75 % vaginal gel Commonly known as:  METROGEL     TAKE these medications   ibuprofen 600 MG tablet Commonly known as:  ADVIL,MOTRIN Take 1 tablet (600 mg total) by mouth every 6 (six) hours as needed for mild pain or moderate pain.   ondansetron 8 MG disintegrating tablet Commonly known as:  ZOFRAN-ODT Take 1 tablet (8 mg total) by mouth 2 (two) times daily.   valACYclovir 500 MG tablet Commonly known as:  VALTREX Take 1 tablet (500 mg total) by mouth 2 (two) times daily.       Diet: routine diet  Activity: Advance as tolerated. Pelvic rest for 6 weeks.   Outpatient follow up:6 weeks Follow up Appt:No future appointments. Follow up Visit:No follow-ups on file.  Postpartum contraception: Undecided  Newborn Data: Live born female  Birth Weight: 6 lb 5.1 oz (2866 g) APGAR: 9, 10  Newborn Delivery   Birth date/time:   04/05/2018 18:09:00 Delivery type:  Vaginal, Spontaneous     Baby Feeding: Bottle Disposition:NICU   04/07/2018 Marikay Alar, CNM

## 2018-04-11 LAB — RPR, QUANT+TP ABS (REFLEX)
Rapid Plasma Reagin, Quant: 1:1 {titer} — ABNORMAL HIGH
T Pallidum Abs: POSITIVE — AB

## 2018-04-11 LAB — RPR: RPR Ser Ql: REACTIVE — AB

## 2018-04-12 ENCOUNTER — Inpatient Hospital Stay (HOSPITAL_COMMUNITY)
Admission: RE | Admit: 2018-04-12 | Discharge: 2018-04-12 | Disposition: A | Discharge status: Home / Self Care | Payer: Medicaid Other | Source: Ambulatory Visit | Attending: Obstetrics and Gynecology | Admitting: Obstetrics and Gynecology

## 2018-04-12 ENCOUNTER — Ambulatory Visit: Payer: Self-pay

## 2018-04-12 NOTE — Lactation Note (Addendum)
This note was copied from a baby's chart. Lactation Consultation Note  Patient Name: Phyllis Dalton [id number] Date: 04/12/2018   Baby in NICU.  RN called stating mother does not have pump at home. She does have DEBP kit she uses when she visits baby.   Suggest to patient she request NICU RN to give her hand pump. Also [place] faxed WIC pump referral and put note on [name] cart to see patient if possible. Mother states she does not have [place] appt until Sept.  Lactation to follow up later today. Mother states she does not have deposit for DEBP.     Maternal Data    Feeding Feeding Type: Breast Milk Nipple Type: Regular Length of feed: 10 min  LATCH Score                   Interventions    Lactation Tools Discussed/Used     Consult Status      [name] 04/12/2018, 9:54 AM

## 2018-04-12 NOTE — Lactation Note (Addendum)
This note was copied from a baby's chart. Lactation Consultation Note  Patient Name: Phyllis Dalton [id number] Date: 04/12/2018 Reason for consult: Follow-up assessment;NICU baby;Early term 37-38.6wks;Other (Comment)(S/P photo tx , for D/C today )  Baby is 45 days old  Per mom has been pumping 4 x' day when at the hospital.  Mom is really full this am, and is pumping at present. LC saw mom at baby's bedside and mom requested the [facility] check baby's tongue,  LC noted the upper lip to stretch well, also noted the labial frenulum to be just above the  Gum line.  Baby able to elevate the sides of the tongue , but not the mid section due to the anterior  Frenulum being tight close to the front. LC recommended when at the [facility] office have  The Dr. Assess the tongue and ask for a referral to a oral specialist. LC offered to place a request in the  [facility] clinic basket to call this mom to set up an appt. For feeding assessment.  Mom receptive and gave the [facility] permission with her phone number.  [place] placed the request.  Mom expressed a desire to be able to breast feed. For now will be pumping.  LC reviewed engorgement prevention and tx  And the importance of consistent. Pumping  To protect milk supply.  Prior [facility] faxed a [place] referral DEBP request, waiting for [facility] to call this [place].  Harmon Pier from [facility] called mom and she has appt. Today for 2:30 pm for a DEBP.  Mom attempting to latch but the baby doesn't stay latched.  LC reminded mom to make sure she is keeping up with her pumping.     Maternal Data    Feeding Feeding Type: Breast Milk Nipple Type: Regular Length of feed: 20 min  LATCH Score                   Interventions    Lactation Tools Discussed/Used Tools: Pump(mom really full , heaDING TO PUMP ) Breast pump type: Double-Electric Breast Pump WIC Program: Yes   Consult Status Consult Status: Follow-up Date: 04/12/18 Follow-up type: In-patient    [place] 04/12/2018, 11:56 AM

## 2018-04-17 ENCOUNTER — Ambulatory Visit: Payer: Self-pay

## 2018-04-17 NOTE — Lactation Note (Addendum)
This note was copied from a baby's chart.  04/17/2018  Name: Phyllis Dalton MRN: [id number] Date of Birth: 04/05/2019 Gestational Age: Gestational Age: [redacted]w[redacted]d Birth Weight: 101.1 oz Weight today:    6 pounds 12.3 ounces (3070 grams) with clean newborn diaper   Infant presents today with mom and 28 yo son for feeding assistance. Mom reports infant is having difficulty with latching. Mom reports infant has a tongue tie causing difficulty with latching.   Infant has gained 270 grams in the last 5 days with an average daily weight gain of 54 grams a day.   Latched infant to the left breast without the NS, infant latched easily with good swallows noted. Mom reports pain with feeding, nipple was rounded post feeding. Infant was removed due to choking and burped. We then applied the # 24 NS and infant relatched, infant with good swallows. Mom reports pain improved with feeding. Infant fed well. Mom pulled her off and infant still cueing to feed, enc mom to offer her the bottle, mom gave her a pacifier instead. Reviewed with mom that infant only took 46 ml and will need some supplement. Infant took bottle well. Infant would have easily relatched, mom was in a hurry to leave the appt and packed up and was ready to go.   Enc mom to use slow flow newborn nipple and to pace bottle feed infant. Mom's milk supply is abundant and infant did not have difficulty going back and forth with breast and bottle.   Infant with thick labial frenulum that inserts around the gum ridge, infant with divot to center of gum ridge. Upper lip tight an blanches with flanging. Infant with thin short anterior lingual frenulum, frenulum does not extend to posterior tongue, it is a separate piece of tissue. Infant with heart shaped tongue with extension. She has limited tongue elevation and lateralization is slightly decreased. Mom reports infant tires easily with feeding at the breast, infant with jaw quiver noted. Reviewed how  restrictions can effect BF and milk supply long term. Enc mom to have infant evaluated by Oral Specialist. Gave information on websites for tongue and lip restriction and information on Dr. Audie Pinto and Dr. Ronny Flurry who accept Medicaid. Mom reports she plans to have infant evaluated. She reports dad has a gap between his front teeth, her older 2 children did not have any issues. Mom BF her last child for 3 years.   Reviewed with mom that infants who BF vs bottle feed are known to eat more frequently at the breast. Reviewed that BF should be limited to 20-30 minutes and a bottle of pumped milk given if she is still hungry post BF. Reviewed with mom that she needs to continue pumping to protect milk supply until infant is feeding more consistently at the breast. Mom is pumping every 2 hours using a manual and electric breast pump. She reports some lumps to outer aspects of breasts when she is full that resolve with massage and pumping. Reviewed s/s Mastitis and Plugged ducts and when to call MD. Reviewed what to expect with transitioning to the breast after tongue/lip release.   Reviewed pre pumping to soften areola as needed and to pump off about an ounce prior to latch if infant still choking at the breast. Mom voiced understanding.   Infant to follow up with Dr. Sabra Heck tomorrow. [facility] has been out to see infant with no plans to return at his time. Mom did not wish to schedule follow up  Lactation appt. Enc mom to call back and reschedule post TT/LT release to learn suck training and to reassess transfer, mom voiced understanding.  Mom voiced she had no further questions/concerns mom aware she can call with any questions/concerns as needed.     General Information: Mother's reason for visit: feeding difficulties related to tongue tie per mom Consult: Initial Lactation consultant: Nonah Mattes RN,IBCLC Breastfeeding experience: will not latch well, pumping and bottle feeding Maternal medical  conditions: Gestational diabetes mellitus Maternal medications: (Enc mom to take PNV while BF)  Breastfeeding History: Frequency of breast feeding: 0 Duration of feeding: 0  Supplementation: Supplement method: bottle(Hospital Nipple)         Breast milk volume: 150 ml Breast milk frequency: every 4 hours Total breast milk volume per day: 900 ml Pump type: Symphony(manual and Symphony) Pump frequency: every 2 hours Pump volume: 240 ml  Infant Output Assessment: Voids per 24 hours: 8+ Urine color: Clear yellow Stools per 24 hours: 8+ Stool color: Yellow  Breast Assessment: Breast: Full, Compressible Nipple: Erect Pain level: 10(mom reports pain of 10 with latch without NS and pain of 7 with NS) Pain interventions: Bra  Feeding Assessment: Infant oral assessment: Variance Infant oral assessment comment: Infant with thick labial frenulum that inserts around the gum ridge, infant with divot to center of gum ridge. Upper lip tight an blanches with flanging. Infant with thin short anterior lingual frenulum, frenulum does not extend to posterior tongue, it is a separate piece of tissue. Infant with heart shaped tongue with extension. She has limited tongue elevation and lateralization is slightly decreased. Mom reports infant tires easily with feeding at the breast, infant with jaw quiver noted.  Positioning: Cross cradle(left breast) Latch: 2 - Grasps breast easily, tongue down, lips flanged, rhythmical sucking. Audible swallowing: 2 - Spontaneous and intermittent Type of nipple: 2 - Everted at rest and after stimulation Comfort: 1 - Filling, red/small blisters or bruises, mild/mod discomfort Hold: 1 - Assistance needed to correctly position infant at breast and maintain latch LATCH score: 8 Latch assessment: Deep Lips flanged: Yes Suck assessment: Displays both Tools: Nipple shield 24 mm Pre-feed weight: 3070 grams Post feed weight: 3116 grams Amount transferred: 46  ml Amount supplemented: 3 ounces  Additional Feeding Assessment:                                    Totals: Total amount transferred: 46 ml in 5 minutes of feeding Total supplement given: 3 ounces Total amount pumped post feed: did not pump here, wanted to go home to pump   Plan:  1. Offer the breast with feeding cues, try to breast feed at least 4 times a day 2. Use the # 24 Nipple Shield with feedings as needed for pain 3. Keep infant awake at the breast as needed 4. Massage/compress breast with feedings if infant sleepy at the breast 5. If infant keeps choking on the breast with let down, pump off 1 ounce prior to latching 6. Empty one breast before offering second breast 7. Offer infant bottle of pumped breast milk after breast feeding if still cueing to feed 8. Continue pumping about every 2-3 hours to empty the breasts to protect milk supply until infant is transferring better 9. Use a slow flow nipple for feeding such as Dr. Saul Fordyce or Medela nipples 10. Look up Paced bottle feeding method (video on [email or web address]) 11. Infant needs 9-75  ml (2-2.5 ounces) for 8 feedings a day or 450-600 ml (15-20 ounces) in 24 hours. She may take more or less depending on how often she feeds 12. Consider having infant evaluated by Oral Specialist 13. Keep up the good work 106. Thank you for allowing me to assist you today 15. Please call with any questions/concerns as needed [phone] 16. Follow up with Lactation 1-2 days post tongue/lip release  Donn Pierini RN, IBCLC                                                   Donn Pierini 04/17/2018, 3:40 PM

## 2018-07-25 ENCOUNTER — Encounter (HOSPITAL_COMMUNITY): Payer: Self-pay | Admitting: Emergency Medicine

## 2018-07-25 ENCOUNTER — Emergency Department (HOSPITAL_COMMUNITY)
Admission: EM | Admit: 2018-07-25 | Discharge: 2018-07-25 | Disposition: A | Discharge status: Home / Self Care | Payer: Medicaid Other | Attending: Emergency Medicine | Admitting: Emergency Medicine

## 2018-07-25 ENCOUNTER — Other Ambulatory Visit: Payer: Self-pay

## 2018-07-25 DIAGNOSIS — Z87891 Personal history of nicotine dependence: Secondary | ICD-10-CM | POA: Diagnosis not present

## 2018-07-25 DIAGNOSIS — M5441 Lumbago with sciatica, right side: Secondary | ICD-10-CM

## 2018-07-25 DIAGNOSIS — E119 Type 2 diabetes mellitus without complications: Secondary | ICD-10-CM | POA: Diagnosis not present

## 2018-07-25 DIAGNOSIS — M545 Low back pain: Secondary | ICD-10-CM | POA: Diagnosis present

## 2018-07-25 DIAGNOSIS — M25511 Pain in right shoulder: Secondary | ICD-10-CM | POA: Insufficient documentation

## 2018-07-25 DIAGNOSIS — Z79899 Other long term (current) drug therapy: Secondary | ICD-10-CM | POA: Diagnosis not present

## 2018-07-25 MED ORDER — CYCLOBENZAPRINE HCL 10 MG PO TABS: 10.0000 mg | ORAL_TABLET | Freq: Two times a day (BID) | ORAL | 0 refills | Status: DC | PRN

## 2018-07-25 MED ORDER — NAPROXEN 500 MG PO TABS: 500.0000 mg | ORAL_TABLET | Freq: Two times a day (BID) | ORAL | 0 refills | Status: DC

## 2018-07-25 MED ORDER — KETOROLAC TROMETHAMINE 60 MG/2ML IM SOLN
30.0000 mg | Freq: Once | INTRAMUSCULAR | Status: AC
  Administered 2018-07-25: 30 mg via INTRAMUSCULAR
  Filled 2018-07-25: qty 2

## 2018-07-25 MED ORDER — CYCLOBENZAPRINE HCL 10 MG PO TABS
10.0000 mg | ORAL_TABLET | Freq: Once | ORAL | Status: AC
  Administered 2018-07-25: 10 mg via ORAL
  Filled 2018-07-25: qty 1

## 2018-07-25 NOTE — ED Triage Notes (Addendum)
Patient c/o lower back pain radiating down leg and to right shoulder worsening x2 weeks. Hx sciatica. Ambulatory. Reports pain worsens with movement. Denies changes in bowel and bladder.

## 2018-07-25 NOTE — ED Provider Notes (Signed)
[place] [facility] DEPT Provider Note   CSN: [id number] Arrival date & time: 07/25/18  1802     History   Chief Complaint Chief Complaint  Patient presents with  . Back Pain    HPI Phyllis Dalton is a 27 y.o. female  With hx of right sciatica who presents to the ED with back pain. The pain is located in the lower back and radiates down the the right leg. Patient also c/o right shoulder pain x 2 weeks. Pain worse with movement. No loss of control of bladder or bowels, no UTI symptoms.   The history is provided by the patient. No language interpreter was used.  Back Pain   This is a new problem. The current episode started more than 1 week ago. The problem occurs constantly. The problem has been gradually worsening. The pain is associated with lifting heavy objects. The pain is present in the lumbar spine. The quality of the pain is described as burning and aching. The pain radiates to the right thigh. The pain is at a severity of 10/10. The symptoms are aggravated by bending and twisting. The pain is the same all the time. Associated symptoms include leg pain. Pertinent negatives include no chest pain, no fever, no headaches, no abdominal pain, no abdominal swelling, no bowel incontinence, no bladder incontinence, no dysuria and no pelvic pain. She has tried NSAIDs for the symptoms. The treatment provided no relief.    Past Medical History:  Diagnosis Date  . Depression   . Diabetes mellitus without complication ([place])   . Gestational diabetes   . Obesity   . Panic attack   . Thrombocytosis ([place]) 09/20/2017  . Trichomonal vaginitis     Patient Active Problem List   Diagnosis Date Noted  . 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 SVD (spontaneous vaginal delivery) 04/05/2018  . Major depressive disorder, single episode, severe, without mention of psychotic behavior--hospitalized during pregnancy at [facility] 03/21/2013  . Panic attack     Past Surgical  History:  Procedure Laterality Date  . MOUTH SURGERY    . TONSILLECTOMY    . TONSILLECTOMY AND ADENOIDECTOMY    . TUBAL LIGATION N/A 04/06/2018   Procedure: POST PARTUM TUBAL LIGATION;  Surgeon: Waymon Amato, MD;  Location: [facility];  Service: Gynecology;  Laterality: N/A;     OB History    Gravida  3   Para  3   Term  3   Preterm  0   AB  0   Living  3     SAB  0   TAB  0   Ectopic  0   Multiple  0   Live Births  3            Home Medications    Prior to Admission medications   Medication Sig Start Date End Date Taking? Authorizing Provider  cyclobenzaprine (FLEXERIL) 10 MG tablet Take 1 tablet (10 mg total) by mouth 2 (two) times daily as needed for muscle spasms. 07/25/18   Ashley Murrain, NP  ibuprofen (ADVIL,MOTRIN) 600 MG tablet Take 1 tablet (600 mg total) by mouth every 6 (six) hours as needed for mild pain or moderate pain. 04/07/18   Marikay Alar, CNM  naproxen (NAPROSYN) 500 MG tablet Take 1 tablet (500 mg total) by mouth 2 (two) times daily. 07/25/18   Ashley Murrain, NP  ondansetron (ZOFRAN ODT) 8 MG disintegrating tablet Take 1 tablet (8 mg total) by  mouth 2 (two) times daily. 01/08/18   Noralyn Pick, FNP    Family History Family History  Problem Relation Age of Onset  . Diabetes Mother   . HIV Mother   . Diabetes Other     Social History Social History   Tobacco Use  . Smoking status: Former Smoker    Types: Cigarettes    Last attempt to quit: 12/25/2012    Years since quitting: 5.5  . Smokeless tobacco: Never Used  Substance Use Topics  . Alcohol use: No    Comment: rare  . Drug use: No    Types: Marijuana    Comment: 11/28/17     Allergies   Patient has no known allergies.   Review of Systems Review of Systems  Constitutional: Negative for chills and fever.  HENT: Negative.   Eyes: Negative for visual disturbance.  Respiratory: Negative for cough and shortness of breath.   Cardiovascular: Negative for chest pain.    Gastrointestinal: Negative for abdominal pain, bowel incontinence, nausea and vomiting.  Genitourinary: Negative for bladder incontinence, dysuria and pelvic pain.  Musculoskeletal: Positive for arthralgias and back pain. Negative for neck pain.  Skin: Negative for rash.  Neurological: Negative for light-headedness and headaches.  Hematological: Negative for adenopathy.  Psychiatric/Behavioral: Negative for confusion.     Physical Exam Updated Vital Signs BP 128/80 (BP Location: Left Arm)   Pulse 97   Temp 98.4 F (36.9 C) (Oral)   Resp 15   Ht 5\' 10"  (1.778 m)   Wt 114.3 kg   SpO2 100%   BMI 36.16 kg/m   Physical Exam  Constitutional: She appears well-developed and well-nourished. No distress.  HENT:  Head: Normocephalic.  Eyes: EOM are normal.  Neck: Neck supple.  Cardiovascular: Normal rate and regular rhythm.  Pulmonary/Chest: Effort normal and breath sounds normal.  Abdominal: Soft. There is no tenderness.  Musculoskeletal:       Lumbar back: She exhibits tenderness and spasm. She exhibits normal pulse. Decreased range of motion: due to pain.       Back:  Neurological: She is alert. She has normal strength.  Reflex Scores:      Bicep reflexes are 2+ on the right side and 2+ on the left side.      Brachioradialis reflexes are 2+ on the right side and 2+ on the left side.      Patellar reflexes are 2+ on the right side and 2+ on the left side. Ambulatory without foot drag  Skin: Skin is warm and dry.  Psychiatric: She has a normal mood and affect. Her behavior is normal.  Nursing note and vitals reviewed.    ED Treatments / Results  Labs (all labs ordered are listed, but only abnormal results are displayed) Labs Reviewed - No data to display  Radiology No results found.  Procedures Procedures (including critical care time)  Medications Ordered in ED Medications  cyclobenzaprine (FLEXERIL) tablet 10 mg (10 mg Oral Given 07/25/18 2041)  ketorolac  (TORADOL) injection 30 mg (30 mg Intramuscular Given 07/25/18 2041)     Initial Impression / Assessment and Plan / ED Course  I have reviewed the triage vital signs and the nursing notes. Patient with back pain.  No neurological deficits and normal neuro exam.  Patient can walk but states is painful.  No loss of bowel or bladder control.  No concern for cauda equina.  No fever, night sweats, weight loss, h/o cancer, IVDU.  RICE protocol and pain medicine indicated  and discussed with patient.   Final Clinical Impressions(s) / ED Diagnoses   Final diagnoses:  Acute bilateral low back pain with right-sided sciatica    ED Discharge Orders         Ordered    cyclobenzaprine (FLEXERIL) 10 MG tablet  2 times daily PRN     07/25/18 2123    naproxen (NAPROSYN) 500 MG tablet  2 times daily     07/25/18 2123           Debroah Baller [place], [place] 07/25/18 2125    Charlesetta Shanks, MD 07/26/18 2156

## 2018-12-19 IMAGING — CR DG KNEE COMPLETE 4+V*L*
4 series · 4 of 4 positions shown · non-contrast
Comparison: None.

CLINICAL DATA: Fall after slipping on ice, left knee pain

EXAM:
LEFT KNEE - COMPLETE 4+ VIEW

[t knee obl left (1 of 2)]
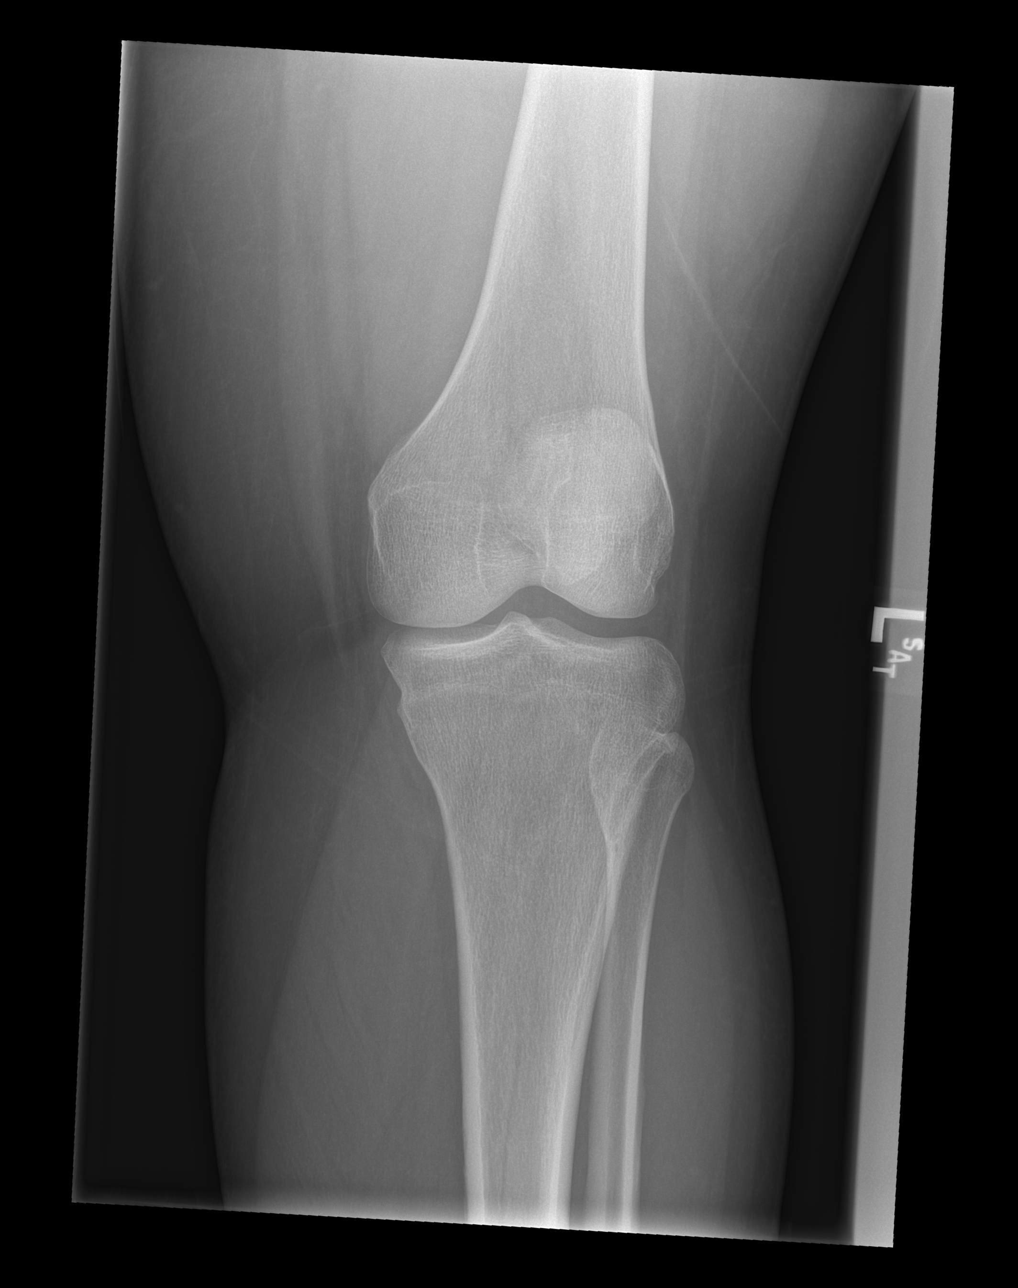

[t knee ap left]
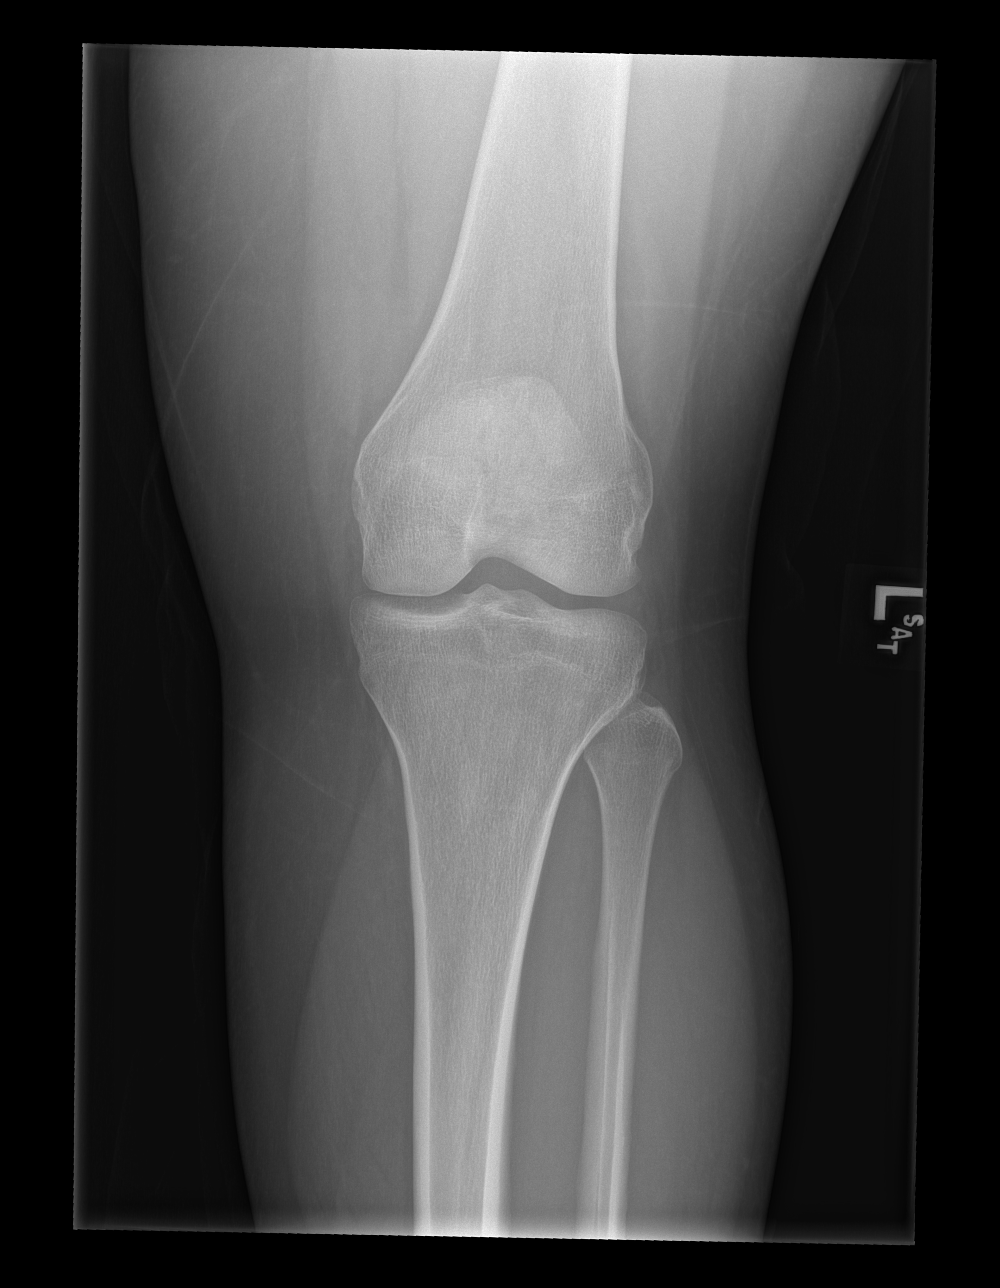

[t knee obl left (2 of 2)]
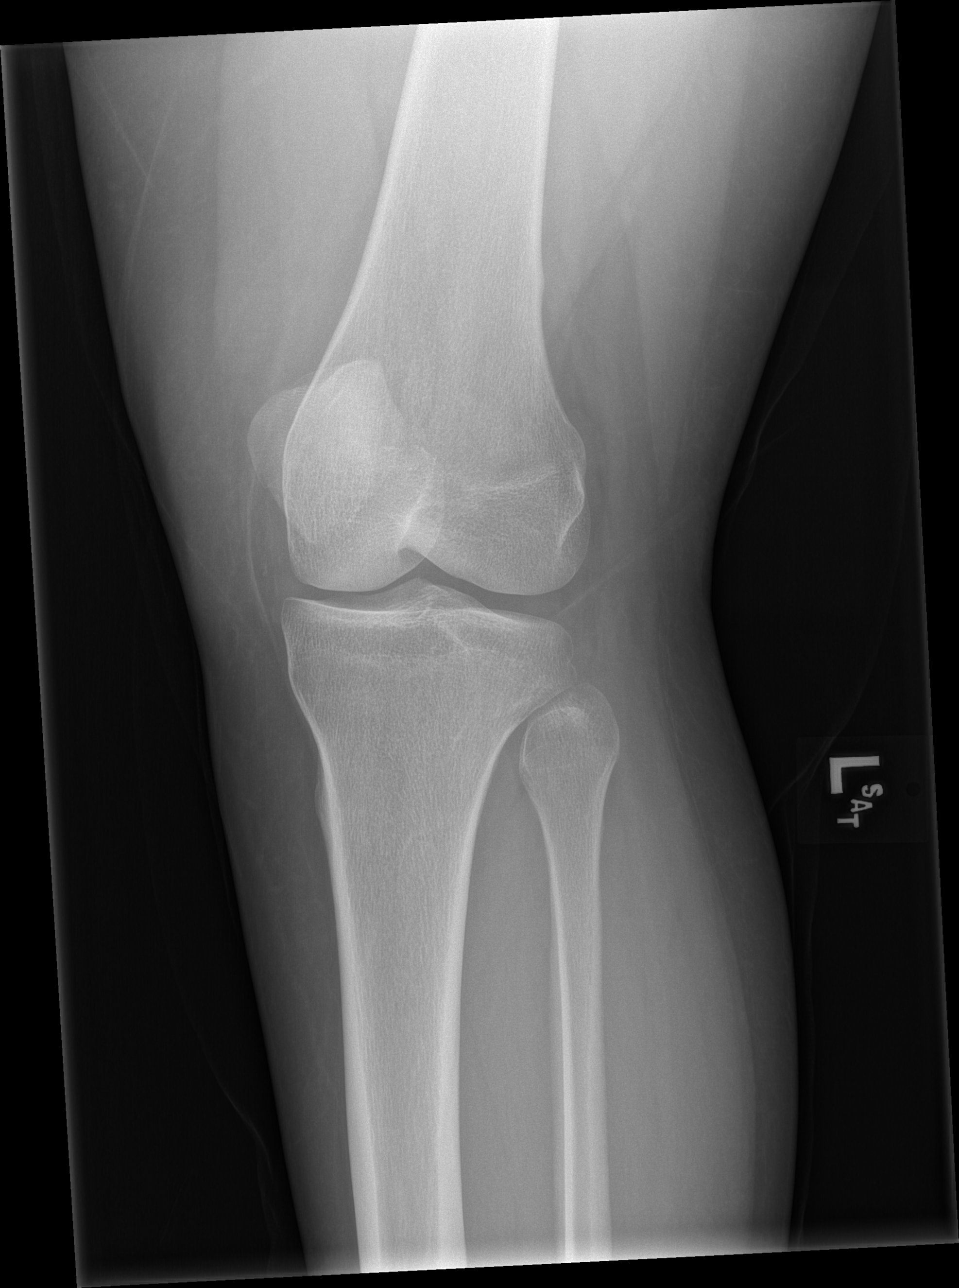

[t knee lat left]
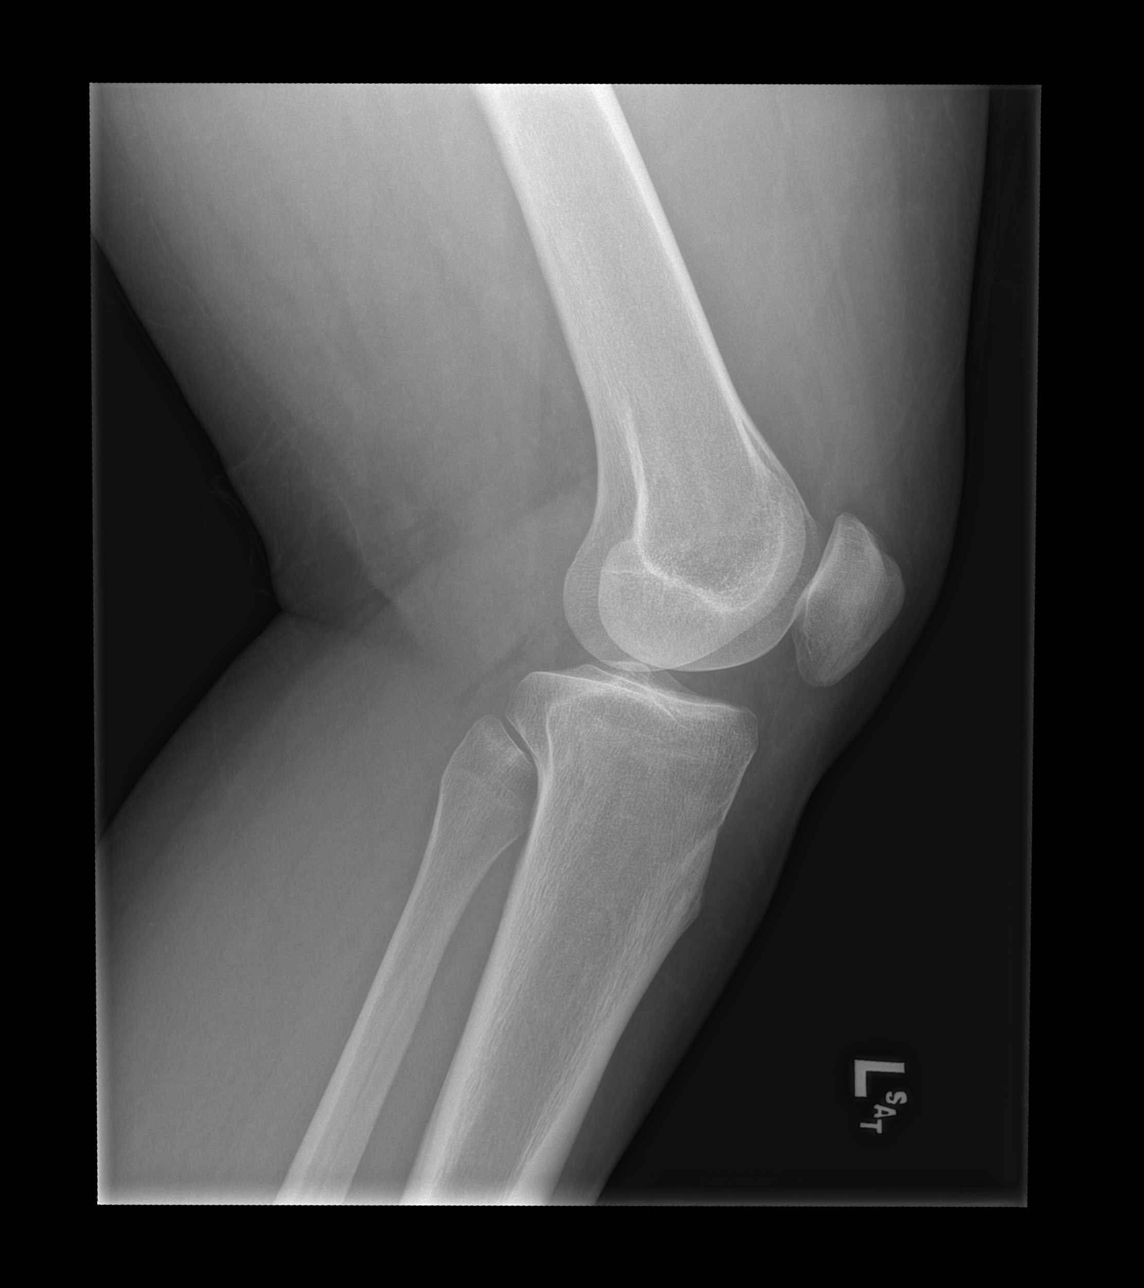

[4 of 4 positions shown; findings below may reference images not displayed]

FINDINGS: Four views of the left knee submitted. No acute fracture or
subluxation. No radiopaque foreign body. Tiny joint effusion.
Minimal narrowing of medial joint compartment.
IMPRESSION: No acute fracture or subluxation. Minimal narrowing of medial joint
compartment. Tiny joint effusion.

## 2018-12-19 IMAGING — CR DG SHOULDER 2+V*L*
3 series · 3 of 3 positions shown · non-contrast
Comparison: None.

CLINICAL DATA: Fell on ice 2 days ago. Left shoulder injury and
pain. Initial encounter.

EXAM:
LEFT SHOULDER - 2+ VIEW

[w shoulder internal left]
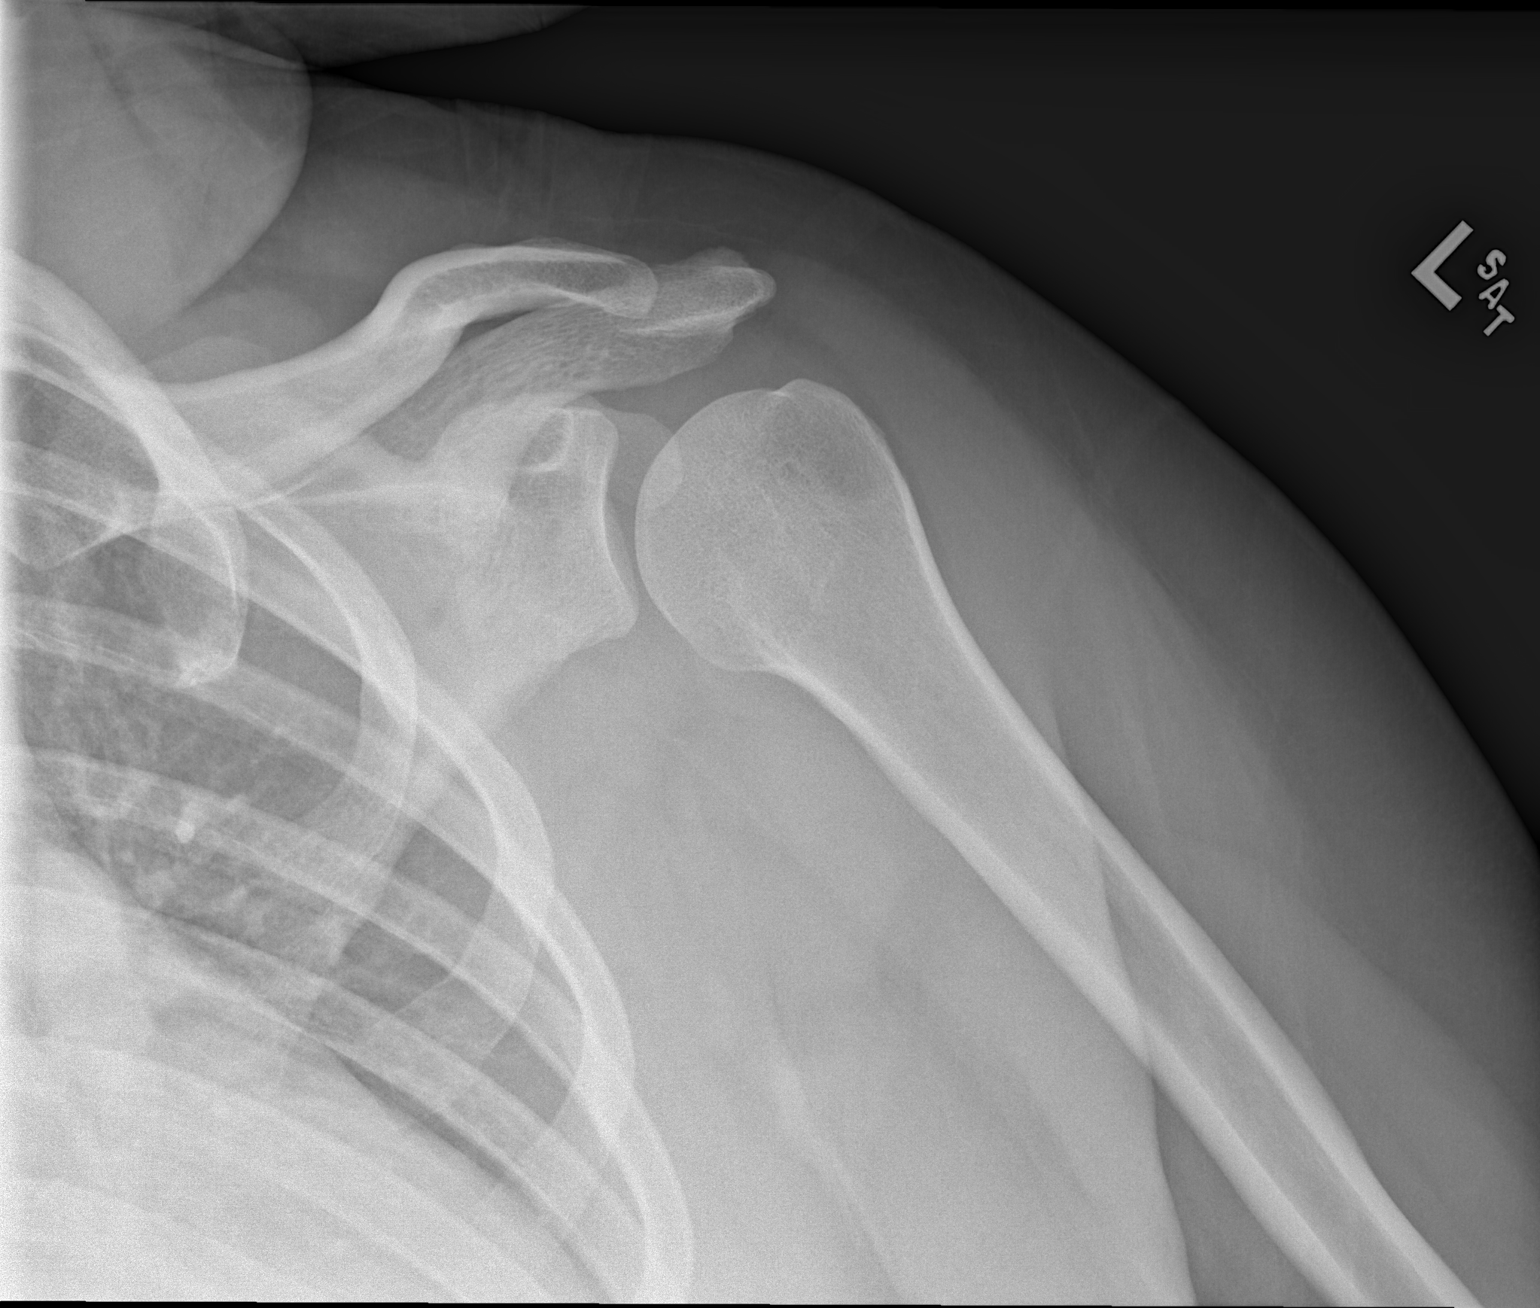

[w shoulder y-view left]
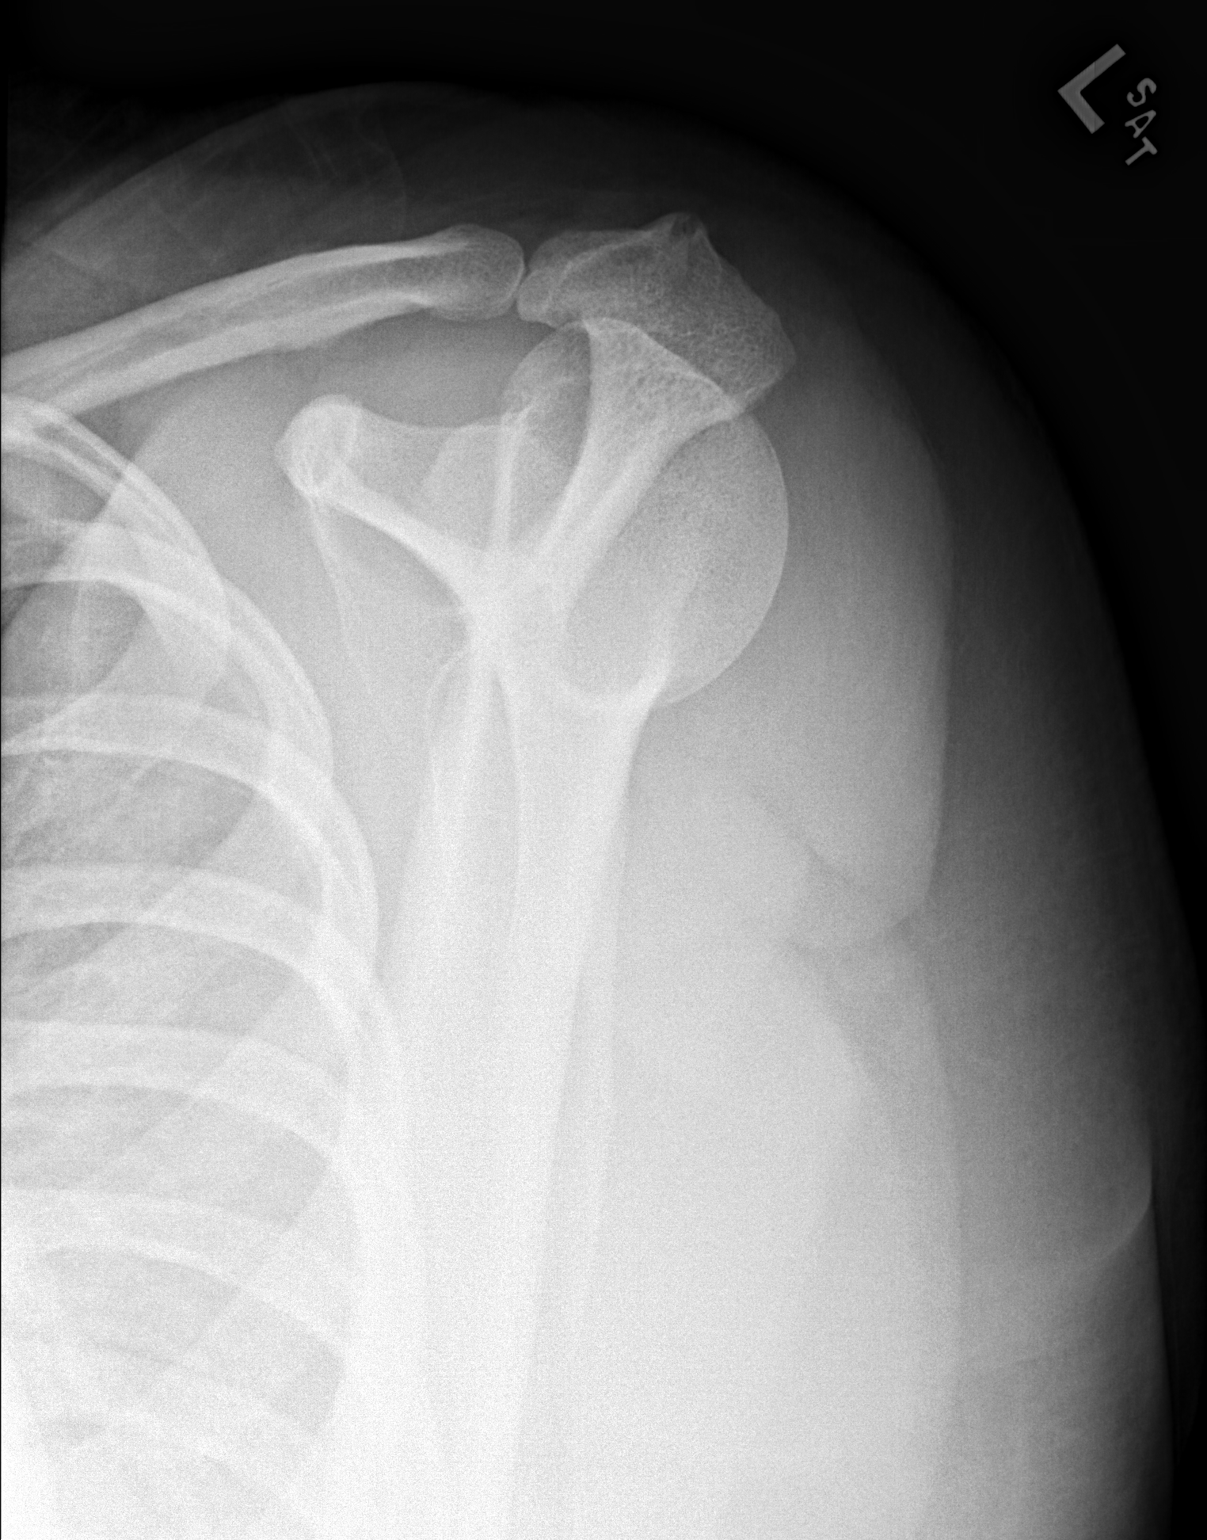

[x shoulder axillary left]
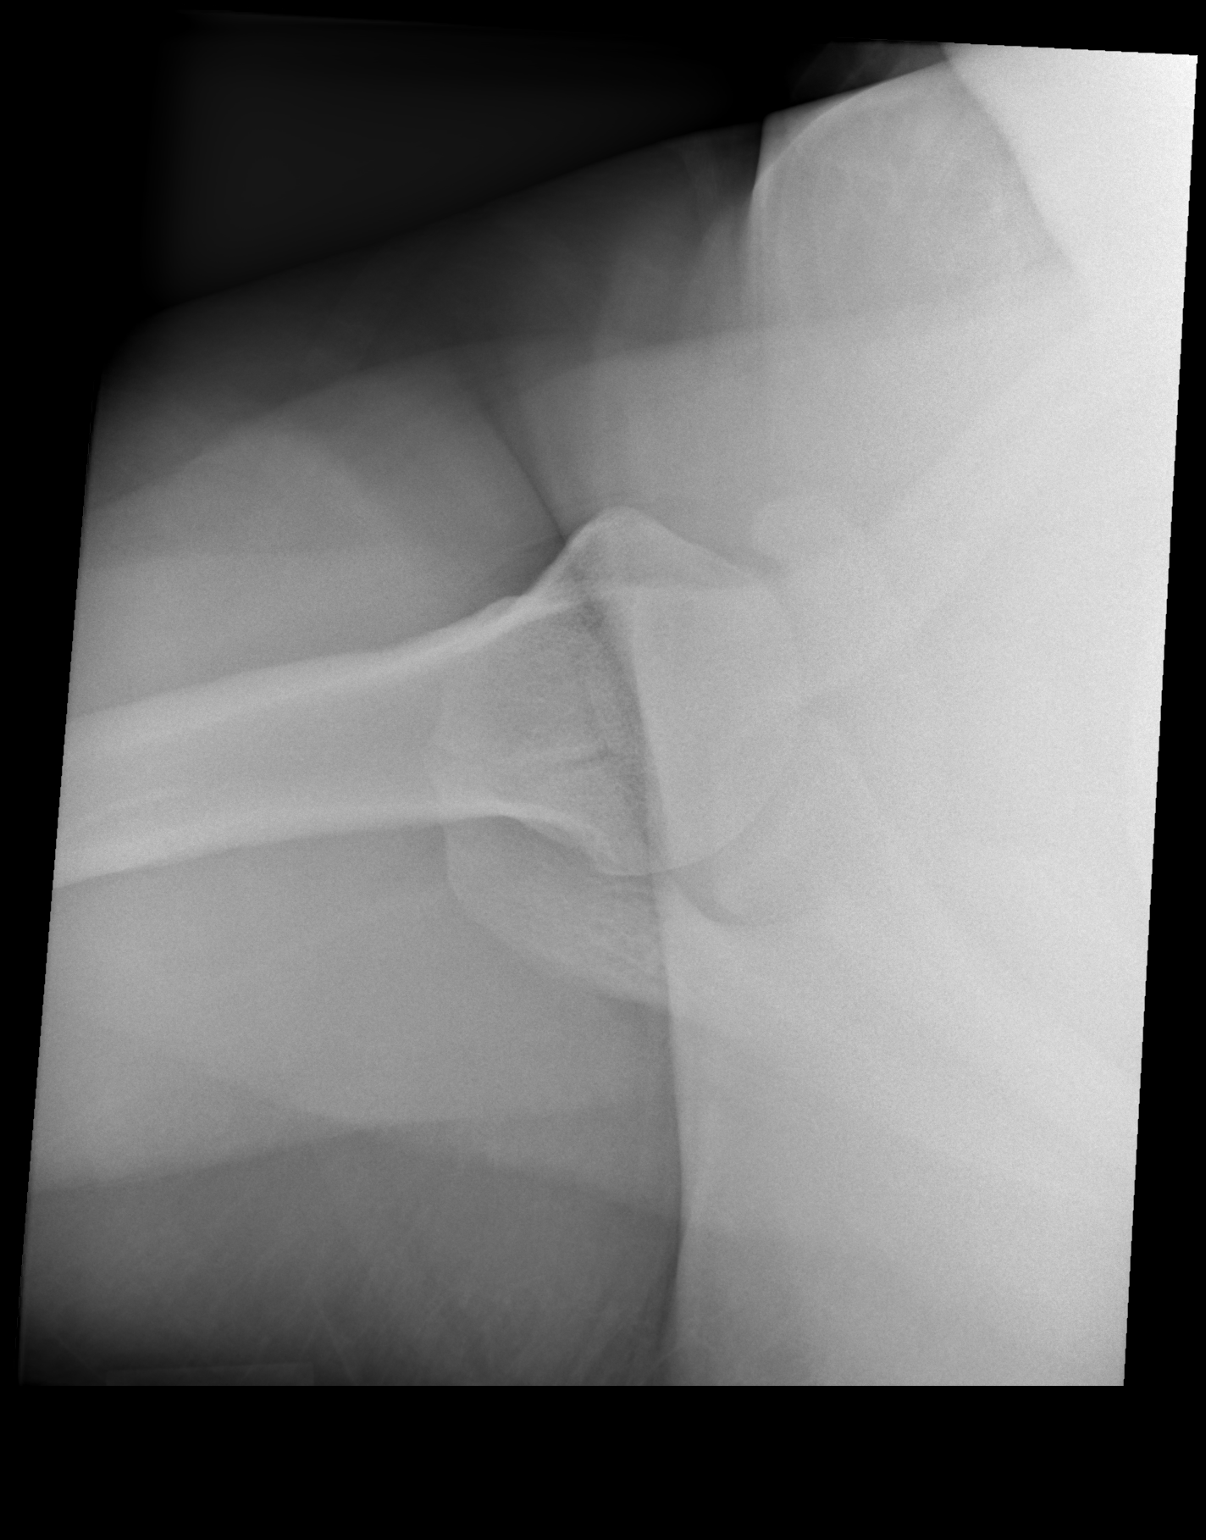

[3 of 3 positions shown; findings below may reference images not displayed]

FINDINGS: There is no evidence of fracture or dislocation. There is no
evidence of arthropathy or other focal bone abnormality. Soft
tissues are unremarkable.
IMPRESSION: Negative.

## 2018-12-26 ENCOUNTER — Other Ambulatory Visit: Payer: Self-pay

## 2018-12-26 ENCOUNTER — Encounter (HOSPITAL_COMMUNITY): Payer: Self-pay | Admitting: Emergency Medicine

## 2018-12-26 ENCOUNTER — Emergency Department (HOSPITAL_COMMUNITY): Payer: Medicaid Other

## 2018-12-26 ENCOUNTER — Emergency Department (HOSPITAL_COMMUNITY)
Admission: EM | Admit: 2018-12-26 | Discharge: 2018-12-26 | Disposition: A | Discharge status: Home / Self Care | Payer: Medicaid Other | Attending: Emergency Medicine | Admitting: Emergency Medicine

## 2018-12-26 DIAGNOSIS — Y9389 Activity, other specified: Secondary | ICD-10-CM | POA: Insufficient documentation

## 2018-12-26 DIAGNOSIS — Z87891 Personal history of nicotine dependence: Secondary | ICD-10-CM | POA: Insufficient documentation

## 2018-12-26 DIAGNOSIS — M545 Low back pain: Secondary | ICD-10-CM | POA: Insufficient documentation

## 2018-12-26 DIAGNOSIS — X500XXA Overexertion from strenuous movement or load, initial encounter: Secondary | ICD-10-CM | POA: Diagnosis not present

## 2018-12-26 DIAGNOSIS — M25512 Pain in left shoulder: Secondary | ICD-10-CM | POA: Diagnosis present

## 2018-12-26 DIAGNOSIS — Y999 Unspecified external cause status: Secondary | ICD-10-CM | POA: Diagnosis not present

## 2018-12-26 DIAGNOSIS — M62838 Other muscle spasm: Secondary | ICD-10-CM | POA: Insufficient documentation

## 2018-12-26 DIAGNOSIS — Y929 Unspecified place or not applicable: Secondary | ICD-10-CM | POA: Insufficient documentation

## 2018-12-26 DIAGNOSIS — E119 Type 2 diabetes mellitus without complications: Secondary | ICD-10-CM | POA: Diagnosis not present

## 2018-12-26 DIAGNOSIS — Z79899 Other long term (current) drug therapy: Secondary | ICD-10-CM | POA: Diagnosis not present

## 2018-12-26 MED ORDER — KETOROLAC TROMETHAMINE 60 MG/2ML IM SOLN
30.0000 mg | Freq: Once | INTRAMUSCULAR | Status: AC
  Administered 2018-12-26: 30 mg via INTRAMUSCULAR
  Filled 2018-12-26: qty 2

## 2018-12-26 NOTE — ED Triage Notes (Signed)
Patient is complaining of left shoulder pain and lower back pain. Patient states she thinks pulled something in the left shoulder. Patient has a condition with her back and she states she is in a lot of pain.

## 2018-12-26 NOTE — Discharge Instructions (Addendum)
You may use over-the-counter Motrin (Ibuprofen), Acetaminophen (Tylenol), topical muscle creams such as SalonPas, Icy Hot, Bengay, etc. Please stretch, apply heat, and have massage therapy for additional assistance. ° °

## 2018-12-26 NOTE — ED Provider Notes (Signed)
[facility] DEPT Provider Note  CSN: [id number] Arrival date & time: 12/26/18 5366  Chief Complaint(s) Back Pain and Shoulder Pain  HPI Phyllis [facility] is a 28 y.o. female with past medical history listed below who presents to the emergency department with 1 day of left shoulder girdle pain that began suddenly yesterday afternoon.  She reports that while trying to get in her car she dropped something and reached down to get it.  When she reached down she felt the muscles of her left shoulder girdle spasm causing pain in the left shoulder girdle and mid/lower back.  Pain is been constant since onset.  Exacerbated with movement and palpation.  Patient has not tried anything to alleviate the pain.  Denies any associated chest pain or shortness of breath.  No recent fevers or infections.  No cough or congestion.  No nausea or vomiting.  No abdominal pain.  No other physical symptoms.  HPI  Past Medical History Past Medical History:  Diagnosis Date  . Depression   . Diabetes mellitus without complication ([place])   . Gestational diabetes   . Obesity   . Panic attack   . Thrombocytosis ([place]) 09/20/2017  . Trichomonal vaginitis    Patient Active Problem List   Diagnosis Date Noted  . 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 SVD (spontaneous vaginal delivery) 04/05/2018  . Major depressive disorder, single episode, severe, without mention of psychotic behavior--hospitalized during pregnancy at [facility] 03/21/2013  . Panic attack    Home Medication(s) Prior to Admission medications   Medication Sig Start Date End Date Taking? Authorizing Provider  cyclobenzaprine (FLEXERIL) 10 MG tablet Take 1 tablet (10 mg total) by mouth 2 (two) times daily as needed for muscle spasms. 07/25/18   Ashley Murrain, NP  ibuprofen (ADVIL,MOTRIN) 600 MG tablet Take 1 tablet (600 mg total) by mouth every 6 (six) hours as needed for mild pain or moderate pain. 04/07/18   Marikay Alar,  CNM  naproxen (NAPROSYN) 500 MG tablet Take 1 tablet (500 mg total) by mouth 2 (two) times daily. 07/25/18   Ashley Murrain, NP  ondansetron (ZOFRAN ODT) 8 MG disintegrating tablet Take 1 tablet (8 mg total) by mouth 2 (two) times daily. 01/08/18   Noralyn Pick, [place]                                                                                                                                    Past Surgical History Past Surgical History:  Procedure Laterality Date  . MOUTH SURGERY    . TONSILLECTOMY    . TONSILLECTOMY AND ADENOIDECTOMY    . TUBAL LIGATION N/A 04/06/2018   Procedure: POST PARTUM TUBAL LIGATION;  Surgeon: Waymon Amato, MD;  Location: [facility];  Service: Gynecology;  Laterality: N/A;   Family History Family History  Problem Relation Age of Onset  . Diabetes Mother   .  HIV Mother   . Diabetes Other     Social History Social History   Tobacco Use  . Smoking status: Former Smoker    Types: Cigarettes    Last attempt to quit: 12/25/2012    Years since quitting: 6.0  . Smokeless tobacco: Never Used  Substance Use Topics  . Alcohol use: No    Comment: rare  . Drug use: No    Types: Marijuana    Comment: 11/28/17   Allergies Patient has no known allergies.  Review of Systems Review of Systems All other systems are reviewed and are negative for acute change except as noted in the HPI  Physical Exam Vital Signs  I have reviewed the triage vital signs BP 111/70 (BP Location: Left Arm)   Pulse 81   Temp 98.7 F (37.1 C) (Oral)   Resp 16   Ht 5\' 10"  (1.778 m)   Wt 113.4 kg   SpO2 100%   BMI 35.87 kg/m   Physical Exam Vitals signs reviewed.  Constitutional:      General: She is not in acute distress.    Appearance: She is well-developed. She is not diaphoretic.  HENT:     Head: Normocephalic and atraumatic.     Nose: Nose normal.  Eyes:     General: No scleral icterus.       Right eye: No discharge.        Left eye: No discharge.      Conjunctiva/sclera: Conjunctivae normal.     Pupils: Pupils are equal, round, and reactive to light.  Neck:     Musculoskeletal: Normal range of motion and neck supple.  Cardiovascular:     Rate and Rhythm: Normal rate and regular rhythm.     Heart sounds: No murmur. No friction rub. No gallop.   Pulmonary:     Effort: Pulmonary effort is normal. No respiratory distress.     Breath sounds: Normal breath sounds. No stridor. No rales.  Abdominal:     General: There is no distension.     Palpations: Abdomen is soft.     Tenderness: There is no abdominal tenderness.  Musculoskeletal:     Cervical back: She exhibits tenderness and spasm. She exhibits no bony tenderness.       Back:  Skin:    General: Skin is warm and dry.     Findings: No erythema or rash.  Neurological:     Mental Status: She is alert and oriented to person, place, and time.     ED Results and Treatments Labs (all labs ordered are listed, but only abnormal results are displayed) Labs Reviewed - No data to display                                                                                                                       EKG  EKG Interpretation  Date/Time:    Ventricular Rate:    PR Interval:    QRS Duration:   QT Interval:  QTC Calculation:   R Axis:     Text Interpretation:        Radiology Dg Shoulder Left  Result Date: 12/26/2018 CLINICAL DATA:  Left shoulder injury. EXAM: LEFT SHOULDER - 2+ VIEW COMPARISON:  09/11/2016 FINDINGS: There is no evidence of fracture or dislocation. There is no evidence of arthropathy or other focal bone abnormality. Soft tissues are unremarkable. IMPRESSION: Negative. Electronically Signed   By: Monte Fantasia M.D.   On: 12/26/2018 05:16   Pertinent labs & imaging results that were available during my care of the patient were reviewed by me and considered in my medical decision making (see chart for details).  Medications Ordered in ED Medications  ketorolac  (TORADOL) injection 30 mg (30 mg Intramuscular Given 12/26/18 5102)                                                                                                                                    Procedures Procedures  (including critical care time)  Medical Decision Making / ED Course I have reviewed the nursing notes for this encounter and the patient's prior records (if available in EHR or on provided paperwork).    Left shoulder girdle pain.  Muscle spasm most likely. Doubt cardiac etiology.  Chest x-ray without evidence suggestive of pneumonia, pneumothorax, pneumomediastinum.  No abnormal contour of the mediastinum to suggest dissection. No evidence of acute injuries.  Low suspicion for pulmonary embolism..  Provided with IM Toradol  Recommended additional supportive/symptomatic management outpatient.  The patient appears reasonably screened and/or stabilized for discharge and I doubt any other medical condition or other [facility] requiring further screening, evaluation, or treatment in the ED at this time prior to discharge.  The patient is safe for discharge with strict return precautions.   Final Clinical Impression(s) / ED Diagnoses Final diagnoses:  Muscle spasm of left shoulder area    Disposition: Discharge  Condition: Good  I have discussed the results, Dx and Tx plan with the patient who expressed understanding and agree(s) with the plan. Discharge instructions discussed at great length. The patient was given strict return precautions who verbalized understanding of the instructions. No further questions at time of discharge.    ED Discharge Orders    None       Follow Up: Primary care provider  Call  If you do not have a primary care physician, contact [facility] at [phone] for referral     This chart was dictated using voice recognition software.  Despite best efforts to proofread,  errors can occur which can change the documentation meaning.    Fatima Blank, MD 12/26/18 [phone]

## 2019-04-21 ENCOUNTER — Encounter (HOSPITAL_COMMUNITY): Payer: Self-pay

## 2019-04-21 ENCOUNTER — Other Ambulatory Visit: Payer: Self-pay

## 2019-04-21 DIAGNOSIS — Z79899 Other long term (current) drug therapy: Secondary | ICD-10-CM | POA: Diagnosis not present

## 2019-04-21 DIAGNOSIS — Z87891 Personal history of nicotine dependence: Secondary | ICD-10-CM | POA: Diagnosis not present

## 2019-04-21 DIAGNOSIS — L02219 Cutaneous abscess of trunk, unspecified: Secondary | ICD-10-CM | POA: Insufficient documentation

## 2019-04-21 NOTE — ED Triage Notes (Signed)
Pt reports abscess under her L breast. Reports a hx of same.

## 2019-04-22 ENCOUNTER — Emergency Department (HOSPITAL_COMMUNITY)
Admission: EM | Admit: 2019-04-22 | Discharge: 2019-04-22 | Disposition: A | Discharge status: Home / Self Care | Payer: Medicaid Other | Attending: Emergency Medicine | Admitting: Emergency Medicine

## 2019-04-22 DIAGNOSIS — L0291 Cutaneous abscess, unspecified: Secondary | ICD-10-CM

## 2019-04-22 MED ORDER — LIDOCAINE HCL (PF) 1 % IJ SOLN
10.0000 mL | Freq: Once | INTRAMUSCULAR | Status: AC
  Administered 2019-04-22: 10 mL
  Filled 2019-04-22: qty 30

## 2019-04-22 MED ORDER — SULFAMETHOXAZOLE-TRIMETHOPRIM 800-160 MG PO TABS: 1.0000 | ORAL_TABLET | Freq: Two times a day (BID) | ORAL | 0 refills | Status: DC

## 2019-04-22 MED ORDER — SULFAMETHOXAZOLE-TRIMETHOPRIM 800-160 MG PO TABS
1.0000 | ORAL_TABLET | Freq: Once | ORAL | Status: AC
  Administered 2019-04-22: 1 via ORAL
  Filled 2019-04-22: qty 1

## 2019-04-22 NOTE — Discharge Instructions (Signed)
Return if any problems.

## 2019-04-22 NOTE — ED Provider Notes (Signed)
[facility] DEPT Provider Note   CSN: [id number] Arrival date & time: 04/21/19  2146     History   Chief Complaint Chief Complaint  Patient presents with  . Abscess    HPI Phyllis Dalton is a 28 y.o. female.     The history is provided by the patient. No language interpreter was used.  Abscess Location:  Torso Torso abscess location:  L breast Size:  2 Abscess quality: fluctuance   Red streaking: no   Duration:  2 days Progression:  Worsening Chronicity:  New Relieved by:  Nothing Worsened by:  Nothing Associated symptoms: no fever     Past Medical History:  Diagnosis Date  . Depression   . Diabetes mellitus without complication ([place])   . Gestational diabetes   . Obesity   . Panic attack   . Thrombocytosis ([place]) 09/20/2017  . Trichomonal vaginitis     Patient Active Problem List   Diagnosis Date Noted  . 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 SVD (spontaneous vaginal delivery) 04/05/2018  . Major depressive disorder, single episode, severe, without mention of psychotic behavior--hospitalized during pregnancy at [facility] 03/21/2013  . Panic attack     Past Surgical History:  Procedure Laterality Date  . MOUTH SURGERY    . TONSILLECTOMY    . TONSILLECTOMY AND ADENOIDECTOMY    . TUBAL LIGATION N/A 04/06/2018   Procedure: POST PARTUM TUBAL LIGATION;  Surgeon: Waymon Amato, MD;  Location: [facility];  Service: Gynecology;  Laterality: N/A;     OB History    Gravida  3   Para  3   Term  3   Preterm  0   AB  0   Living  3     SAB  0   TAB  0   Ectopic  0   Multiple  0   Live Births  3            Home Medications    Prior to Admission medications   Medication Sig Start Date End Date Taking? Authorizing Provider  cyclobenzaprine (FLEXERIL) 10 MG tablet Take 1 tablet (10 mg total) by mouth 2 (two) times daily as needed for muscle spasms. 07/25/18   Ashley Murrain, NP  ibuprofen (ADVIL,MOTRIN)  600 MG tablet Take 1 tablet (600 mg total) by mouth every 6 (six) hours as needed for mild pain or moderate pain. 04/07/18   Marikay Alar, CNM  naproxen (NAPROSYN) 500 MG tablet Take 1 tablet (500 mg total) by mouth 2 (two) times daily. 07/25/18   Ashley Murrain, NP  ondansetron (ZOFRAN ODT) 8 MG disintegrating tablet Take 1 tablet (8 mg total) by mouth 2 (two) times daily. 01/08/18   Noralyn Pick, FNP  sulfamethoxazole-trimethoprim (BACTRIM DS) 800-160 MG tablet Take 1 tablet by mouth 2 (two) times daily. 04/22/19   Fransico Meadow, PA-C    Family History Family History  Problem Relation Age of Onset  . Diabetes Mother   . HIV Mother   . Diabetes Other     Social History Social History   Tobacco Use  . Smoking status: Former Smoker    Types: Cigarettes    Quit date: 12/25/2012    Years since quitting: 6.3  . Smokeless tobacco: Never Used  Substance Use Topics  . Alcohol use: No    Comment: rare  . Drug use: No    Types: Marijuana    Comment: 11/28/17     Allergies  Patient has no known allergies.   Review of Systems Review of Systems  Constitutional: Negative for fever.  Skin: Positive for wound.  All other systems reviewed and are negative.    Physical Exam Updated Vital Signs BP 115/69 (BP Location: Left Arm)   Pulse 86   Temp 98.3 F (36.8 C) (Oral)   Resp 16   SpO2 98%   Physical Exam Vitals signs reviewed.  Musculoskeletal: Normal range of motion.  Skin:    Comments: 3cm swollen area under left breast   Neurological:     General: No focal deficit present.     Mental Status: She is alert.  Psychiatric:        Mood and Affect: Mood normal.      ED Treatments / Results  Labs (all labs ordered are listed, but only abnormal results are displayed) Labs Reviewed - No data to display  EKG None  Radiology No results found.  Procedures .Marland KitchenIncision and Drainage  Date/Time: 04/22/2019 12:57 AM Performed by: Fransico Meadow, PA-C Authorized by:  Fransico Meadow, PA-C   Consent:    Consent obtained:  Verbal   Consent given by:  Patient   Risks discussed:  Infection   Alternatives discussed:  No treatment Location:    Type:  Abscess   Size:  2   Location:  Trunk   Trunk location:  L breast Pre-procedure details:    Skin preparation:  Betadine Anesthesia (see MAR for exact dosages):    Anesthesia method:  Local infiltration   Local anesthetic:  Lidocaine 2% w/o epi Procedure type:    Complexity:  Simple Procedure details:    Needle aspiration: no     Incision types:  Stab incision   Incision depth:  Dermal   Scalpel blade:  11   Wound management:  Probed and deloculated   Drainage amount:  Moderate   Wound treatment:  Wound left open Post-procedure details:    Patient tolerance of procedure:  Tolerated well, no immediate complications   (including critical care time)  Medications Ordered in ED Medications  lidocaine (PF) (XYLOCAINE) 1 % injection 10 mL (has no administration in time range)  sulfamethoxazole-trimethoprim (BACTRIM DS) 800-160 MG per tablet 1 tablet (has no administration in time range)     Initial Impression / Assessment and Plan / ED Course  I have reviewed the triage vital signs and the nursing notes.  Pertinent labs & imaging results that were available during my care of the patient were reviewed by me and considered in my medical decision making (see chart for details).        MDM  Pt counseled on wound care.  Pt give bactrim here.  Rx for bactrim   Final Clinical Impressions(s) / ED Diagnoses   Final diagnoses:  Abscess    ED Discharge Orders         Ordered    sulfamethoxazole-trimethoprim (BACTRIM DS) 800-160 MG tablet  2 times daily     04/22/19 0044        An After Visit Summary was printed and given to the patient.    Fransico Meadow, [place] 04/22/19 0059    Orpah Greek, MD 04/22/19 [phone]

## 2020-08-10 ENCOUNTER — Emergency Department (HOSPITAL_BASED_OUTPATIENT_CLINIC_OR_DEPARTMENT_OTHER)
Admission: EM | Admit: 2020-08-10 | Discharge: 2020-08-11 | Disposition: A | Discharge status: Home / Self Care | Payer: Medicaid Other | Attending: Emergency Medicine | Admitting: Emergency Medicine

## 2020-08-10 ENCOUNTER — Encounter (HOSPITAL_BASED_OUTPATIENT_CLINIC_OR_DEPARTMENT_OTHER): Payer: Self-pay | Admitting: *Deleted

## 2020-08-10 ENCOUNTER — Other Ambulatory Visit: Payer: Self-pay

## 2020-08-10 DIAGNOSIS — Z87891 Personal history of nicotine dependence: Secondary | ICD-10-CM | POA: Diagnosis not present

## 2020-08-10 DIAGNOSIS — R0789 Other chest pain: Secondary | ICD-10-CM | POA: Diagnosis present

## 2020-08-10 DIAGNOSIS — E119 Type 2 diabetes mellitus without complications: Secondary | ICD-10-CM | POA: Insufficient documentation

## 2020-08-10 DIAGNOSIS — L03313 Cellulitis of chest wall: Secondary | ICD-10-CM | POA: Insufficient documentation

## 2020-08-10 MED ORDER — DOXYCYCLINE HYCLATE 100 MG PO TABS
100.0000 mg | ORAL_TABLET | Freq: Once | ORAL | Status: AC
  Administered 2020-08-11: 100 mg via ORAL
  Filled 2020-08-10: qty 1

## 2020-08-10 MED ORDER — DOXYCYCLINE HYCLATE 100 MG PO CAPS: 100.0000 mg | ORAL_CAPSULE | Freq: Two times a day (BID) | ORAL | 0 refills | Status: DC

## 2020-08-10 NOTE — Discharge Instructions (Addendum)
You were seen today for potential abscess of the left chest wall.  You do not have evidence of abscess at this time.  You likely have some early cellulitis.  Sometimes this can develop into an abscess.  Take antibiotics as prescribed.  If you develop worsening pain, redness, drainage, or fevers, you need to be reevaluated.

## 2020-08-10 NOTE — ED Triage Notes (Signed)
C/o left breast abscess x 4 days

## 2020-08-10 NOTE — ED Provider Notes (Signed)
[facility] Provider Note   CSN: [id number] Arrival date & time: 08/10/20  2009     History Chief Complaint  Patient presents with  . Abscess    Phyllis Dalton is a 29 y.o. female.  HPI     This is a 29 year old female with a history of diabetes, obesity who presents with concern for abscess of the left chest wall breast.  Patient reports 3 to 4-day history of pain and tenderness to the left chest wall underneath her left breast.  She is not noted any drainage.  No significant redness.  She has a history of abscess and feels that this is consistent.  She is not had any fevers or systemic symptoms.  She is not currently lactating.  She rates her pain at 7 out of 10.  She is not taking anything for her symptoms.  Denies nipple drainage.  Past Medical History:  Diagnosis Date  . Depression   . Diabetes mellitus without complication ([place])   . Gestational diabetes   . Obesity   . Panic attack   . Thrombocytosis 09/20/2017  . Trichomonal vaginitis     Patient Active Problem List   Diagnosis Date Noted  . 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 SVD (spontaneous vaginal delivery) 04/05/2018  . Major depressive disorder, single episode, severe, without mention of psychotic behavior--hospitalized during pregnancy at [facility] 03/21/2013  . Panic attack     Past Surgical History:  Procedure Laterality Date  . MOUTH SURGERY    . TONSILLECTOMY    . TONSILLECTOMY AND ADENOIDECTOMY    . TUBAL LIGATION N/A 04/06/2018   Procedure: POST PARTUM TUBAL LIGATION;  Surgeon: Waymon Amato, MD;  Location: [facility];  Service: Gynecology;  Laterality: N/A;     OB History    Gravida  3   Para  3   Term  3   Preterm  0   AB  0   Living  3     SAB  0   IAB  0   Ectopic  0   Multiple  0   Live Births  3           Family History  Problem Relation Age of Onset  . Diabetes Mother   . HIV Mother   . Diabetes Other     Social  History   Tobacco Use  . Smoking status: Former Smoker    Types: Cigarettes    Quit date: 12/25/2012    Years since quitting: 7.6  . Smokeless tobacco: Never Used  Vaping Use  . Vaping Use: Never used  Substance Use Topics  . Alcohol use: No    Comment: rare  . Drug use: No    Types: Marijuana    Comment: 11/28/17    Home Medications Prior to Admission medications   Medication Sig Start Date End Date Taking? Authorizing Provider  cyclobenzaprine (FLEXERIL) 10 MG tablet Take 1 tablet (10 mg total) by mouth 2 (two) times daily as needed for muscle spasms. 07/25/18   Ashley Murrain, NP  doxycycline (VIBRAMYCIN) 100 MG capsule Take 1 capsule (100 mg total) by mouth 2 (two) times daily. 08/10/20   [name], Barbette Hair, MD  ibuprofen (ADVIL,MOTRIN) 600 MG tablet Take 1 tablet (600 mg total) by mouth every 6 (six) hours as needed for mild pain or moderate pain. 04/07/18   Marikay Alar, CNM  naproxen (NAPROSYN) 500 MG tablet Take 1 tablet (500 mg total)  by mouth 2 (two) times daily. 07/25/18   Ashley Murrain, NP  ondansetron (ZOFRAN ODT) 8 MG disintegrating tablet Take 1 tablet (8 mg total) by mouth 2 (two) times daily. 01/08/18   Noralyn Pick, FNP  sulfamethoxazole-trimethoprim (BACTRIM DS) 800-160 MG tablet Take 1 tablet by mouth 2 (two) times daily. 04/22/19   Fransico Meadow, PA-C    Allergies    Patient has no known allergies.  Review of Systems   Review of Systems  Constitutional: Negative for fever.  Respiratory: Negative for shortness of breath.   Cardiovascular: Negative for chest pain.  Gastrointestinal: Negative for abdominal pain, nausea and vomiting.  Skin: Positive for color change.  All other systems reviewed and are negative.   Physical Exam Updated Vital Signs BP 110/76   Pulse 85   Temp 98.4 F (36.9 C) (Oral)   Resp 18   Ht 1.803 m (5\' 11" )   Wt 121.1 kg   LMP 07/14/2020   SpO2 100%   BMI 37.24 kg/m   Physical Exam Vitals and nursing note reviewed.   Constitutional:      Appearance: She is well-developed and well-nourished. She is obese. She is not ill-appearing.  HENT:     Head: Normocephalic and atraumatic.     Nose: Nose normal.     Mouth/Throat:     Mouth: Mucous membranes are moist.  Eyes:     Pupils: Pupils are equal, round, and reactive to light.  Cardiovascular:     Rate and Rhythm: Normal rate and regular rhythm.     Heart sounds: Normal heart sounds.  Pulmonary:     Effort: Pulmonary effort is normal. No respiratory distress.     Breath sounds: No wheezing.  Chest:     Chest wall: Tenderness present.  Breasts:     Right: No inverted nipple, mass or nipple discharge.     Left: Skin change and tenderness present. No inverted nipple, mass or nipple discharge.        Comments: Slight area of induration at the left lateral chest wall under the crease of the breast, no fluctuance noted, slight erythema, no spontaneous drainage Abdominal:     Palpations: Abdomen is soft.     Tenderness: There is no abdominal tenderness.  Musculoskeletal:     Cervical back: Neck supple.  Skin:    General: Skin is warm and dry.  Neurological:     Mental Status: She is alert and oriented to person, place, and time.  Psychiatric:        Mood and Affect: Mood and affect and mood normal.     ED Results / Procedures / Treatments   Labs (all labs ordered are listed, but only abnormal results are displayed) Labs Reviewed - No data to display  EKG None  Radiology No results found.  Procedures Procedures (including critical care time)  Medications Ordered in ED Medications  doxycycline (VIBRA-TABS) tablet 100 mg (has no administration in time range)    ED Course  I have reviewed the triage vital signs and the nursing notes.  Pertinent labs & imaging results that were available during my care of the patient were reviewed by me and considered in my medical decision making (see chart for details).    MDM  Rules/Calculators/A&P                          Patient presents with concern for possible abscess to the left chest wall and  underneath the left breast.  She is overall nontoxic and vital signs are reassuring.  No obvious drainable abscess on exam.  There is slight induration and erythema suggestive of cellulitis.  Do not feel she has a drainable abscess at this time.  She has a history of abscesses in the past.  She is not toxic appearing.  Will start on doxycycline.  Discussed with the patient that sometimes a drainable abscess will develop and she will need to be reevaluated if she notes increasing redness, pain, drainage, or any systemic symptoms.  Patient stated understanding.  After history, exam, and medical workup I feel the patient has been appropriately medically screened and is safe for discharge home. Pertinent diagnoses were discussed with the patient. Patient was given return precautions.  Final Clinical Impression(s) / ED Diagnoses Final diagnoses:  Cellulitis of chest wall    Rx / DC Orders ED Discharge Orders         Ordered    doxycycline (VIBRAMYCIN) 100 MG capsule  2 times daily        08/10/20 2355           Merryl Hacker, MD 08/10/20 2359

## 2020-11-29 ENCOUNTER — Emergency Department (HOSPITAL_COMMUNITY): Payer: Medicaid Other

## 2020-11-29 ENCOUNTER — Other Ambulatory Visit: Payer: Self-pay

## 2020-11-29 ENCOUNTER — Emergency Department (HOSPITAL_COMMUNITY)
Admission: EM | Admit: 2020-11-29 | Discharge: 2020-11-29 | Disposition: A | Discharge status: Home / Self Care | Payer: Medicaid Other | Attending: Emergency Medicine | Admitting: Emergency Medicine

## 2020-11-29 ENCOUNTER — Encounter (HOSPITAL_COMMUNITY): Payer: Self-pay

## 2020-11-29 DIAGNOSIS — Q760 Spina bifida occulta: Secondary | ICD-10-CM | POA: Diagnosis not present

## 2020-11-29 DIAGNOSIS — M5441 Lumbago with sciatica, right side: Secondary | ICD-10-CM | POA: Diagnosis not present

## 2020-11-29 DIAGNOSIS — N9489 Other specified conditions associated with female genital organs and menstrual cycle: Secondary | ICD-10-CM | POA: Insufficient documentation

## 2020-11-29 DIAGNOSIS — M5136 Other intervertebral disc degeneration, lumbar region: Secondary | ICD-10-CM | POA: Diagnosis not present

## 2020-11-29 DIAGNOSIS — M545 Low back pain, unspecified: Secondary | ICD-10-CM | POA: Diagnosis present

## 2020-11-29 DIAGNOSIS — Y92009 Unspecified place in unspecified non-institutional (private) residence as the place of occurrence of the external cause: Secondary | ICD-10-CM | POA: Diagnosis not present

## 2020-11-29 DIAGNOSIS — Z87891 Personal history of nicotine dependence: Secondary | ICD-10-CM | POA: Diagnosis not present

## 2020-11-29 DIAGNOSIS — X501XXA Overexertion from prolonged static or awkward postures, initial encounter: Secondary | ICD-10-CM | POA: Diagnosis not present

## 2020-11-29 LAB — CBC WITH DIFFERENTIAL/PLATELET
Abs Immature Granulocytes: 0.06 10*3/uL (ref 0.00–0.07)
Basophils Absolute: 0.1 10*3/uL (ref 0.0–0.1)
Basophils Relative: 1 %
Eosinophils Absolute: 0 10*3/uL (ref 0.0–0.5)
Eosinophils Relative: 0 %
HCT: 36.3 % (ref 36.0–46.0)
Hemoglobin: 11.5 g/dL — ABNORMAL LOW (ref 12.0–15.0)
Immature Granulocytes: 1 %
Lymphocytes Relative: 15 %
Lymphs Abs: 1.7 10*3/uL (ref 0.7–4.0)
MCH: 25.3 pg — ABNORMAL LOW (ref 26.0–34.0)
MCHC: 31.7 g/dL (ref 30.0–36.0)
MCV: 79.8 fL — ABNORMAL LOW (ref 80.0–100.0)
Monocytes Absolute: 0.7 10*3/uL (ref 0.1–1.0)
Monocytes Relative: 6 %
Neutro Abs: 9.4 10*3/uL — ABNORMAL HIGH (ref 1.7–7.7)
Neutrophils Relative %: 77 %
Platelets: 473 10*3/uL — ABNORMAL HIGH (ref 150–400)
RBC: 4.55 MIL/uL (ref 3.87–5.11)
RDW: 14.9 % (ref 11.5–15.5)
WBC: 12 10*3/uL — ABNORMAL HIGH (ref 4.0–10.5)
nRBC: 0 % (ref 0.0–0.2)

## 2020-11-29 LAB — COMPREHENSIVE METABOLIC PANEL
ALT: 22 U/L (ref 0–44)
AST: 20 U/L (ref 15–41)
Albumin: 3.6 g/dL (ref 3.5–5.0)
Alkaline Phosphatase: 85 U/L (ref 38–126)
Anion gap: 10 (ref 5–15)
BUN: 7 mg/dL (ref 6–20)
CO2: 24 mmol/L (ref 22–32)
Calcium: 9.2 mg/dL (ref 8.9–10.3)
Chloride: 103 mmol/L (ref 98–111)
Creatinine, Ser: 0.76 mg/dL (ref 0.44–1.00)
GFR, Estimated: >60 mL/min (ref >60)
Glucose, Bld: 94 mg/dL (ref 70–99)
Potassium: 3.9 mmol/L (ref 3.5–5.1)
Sodium: 137 mmol/L (ref 135–145)
Total Bilirubin: 0.6 mg/dL (ref 0.3–1.2)
Total Protein: 7 g/dL (ref 6.5–8.1)

## 2020-11-29 LAB — I-STAT BETA HCG BLOOD, ED (MC, WL, AP ONLY): I-stat hCG, quantitative: <5 m[IU]/mL (ref <5)

## 2020-11-29 MED ORDER — OXYCODONE-ACETAMINOPHEN 5-325 MG PO TABS
1.0000 | ORAL_TABLET | Freq: Once | ORAL | Status: AC
  Administered 2020-11-29: 1 via ORAL
  Filled 2020-11-29: qty 1

## 2020-11-29 MED ORDER — LIDOCAINE 5 % EX PTCH
1.0000 | MEDICATED_PATCH | CUTANEOUS | Status: DC
  Administered 2020-11-29: 1 via TRANSDERMAL
  Filled 2020-11-29: qty 1

## 2020-11-29 MED ORDER — LIDOCAINE 5 % EX PTCH: 1.0000 | MEDICATED_PATCH | CUTANEOUS | 0 refills | Status: DC

## 2020-11-29 MED ORDER — METHOCARBAMOL 1000 MG/10ML IJ SOLN: 1000.0000 mg | Freq: Once | INTRAMUSCULAR | Status: DC

## 2020-11-29 MED ORDER — PREDNISONE 10 MG PO TABS: 40.0000 mg | ORAL_TABLET | Freq: Every day | ORAL | 0 refills | Status: AC

## 2020-11-29 MED ORDER — METHOCARBAMOL 1000 MG/10ML IJ SOLN
1000.0000 mg | Freq: Once | INTRAVENOUS | Status: AC
  Administered 2020-11-29: 1000 mg via INTRAVENOUS
  Filled 2020-11-29: qty 10

## 2020-11-29 MED ORDER — MORPHINE SULFATE (PF) 4 MG/ML IV SOLN: 4.0000 mg | Freq: Once | INTRAVENOUS | Status: DC

## 2020-11-29 MED ORDER — HYDROMORPHONE HCL 1 MG/ML IJ SOLN
1.0000 mg | Freq: Once | INTRAMUSCULAR | Status: AC
  Administered 2020-11-29: 1 mg via INTRAVENOUS
  Filled 2020-11-29: qty 1

## 2020-11-29 MED ORDER — METHOCARBAMOL 500 MG PO TABS: 500.0000 mg | ORAL_TABLET | Freq: Two times a day (BID) | ORAL | 0 refills | Status: AC

## 2020-11-29 MED ORDER — DEXAMETHASONE SODIUM PHOSPHATE 10 MG/ML IJ SOLN
10.0000 mg | Freq: Once | INTRAMUSCULAR | Status: AC
  Administered 2020-11-29: 10 mg via INTRAVENOUS
  Filled 2020-11-29: qty 1

## 2020-11-29 NOTE — ED Notes (Signed)
Patient transported to MRI 

## 2020-11-29 NOTE — ED Provider Notes (Signed)
Care handoff received from [facility] PA-C at shift change please see previous provider note for full details of visit.  In short 30 year old female presented with acute persistent lower and right lower back pain after she heard something pop just prior to arrival.  Pain radiates down her right leg, history of sciatic pain worse today.  Patient reporting an inability to feel or move her leg.  No saddle anesthesia, incontinence or retention.  At time of shift change patient's lumbar x-ray is pending.  Patient has received IV muscle relaxers along with other pain medication.  Plan of care is to follow-up on those x-rays and if pain is not significantly improved to obtain MRI for further evaluation. Physical Exam  BP (!) 104/57   Pulse 87   Temp 97.9 F (36.6 C) (Oral)   Resp 17   Ht 5\' 7"  (1.702 m)   Wt 117.9 kg   SpO2 94%   BMI 40.72 kg/m   Physical Exam Constitutional:      General: She is not in acute distress.    Appearance: Normal appearance. She is well-developed. She is not ill-appearing or diaphoretic.  HENT:     Head: Normocephalic and atraumatic.  Eyes:     General: Vision grossly intact. Gaze aligned appropriately.     Pupils: Pupils are equal, round, and reactive to light.  Neck:     Trachea: Trachea and phonation normal.  Pulmonary:     Effort: Pulmonary effort is normal. No respiratory distress.  Abdominal:     General: There is no distension.     Palpations: Abdomen is soft.     Tenderness: There is no abdominal tenderness. There is no guarding or rebound.  Musculoskeletal:        General: Normal range of motion.     Cervical back: Normal range of motion.     Comments: No midline C/T/L spinal tenderness to palpation, no deformity, crepitus, or step-off noted. - Right gluteal and right lumbar paraspinal muscular tenderness.  Skin:    General: Skin is warm and dry.  Neurological:     Mental Status: She is alert.     GCS: GCS eye subscore is 4. GCS verbal  subscore is 5. GCS motor subscore is 6.     Comments: Speech is clear and goal oriented, follows commands Major Cranial nerves without deficit, no facial droop Normal strength in upper and lower extremities bilaterally including dorsiflexion and plantar flexion, strong and equal grip strength Sensation normal to light and sharp touch Moves extremities without ataxia, coordination intact  Psychiatric:        Behavior: Behavior normal.     ED Course/Procedures   Clinical Course as of 11/29/20 1157  Sun Nov 29, 2020  0551 Patient with improvement in pain after medications ordered.  Is beginning to move her right leg some.  Continues to report that it is numb. [HM]    Clinical Course User Index [HM] Muthersbaugh, Jarrett Soho, PA-C    Procedures  MDM  Additional history obtained from: 1. Nursing notes from this visit. 2. Review of electronic medical records. ---------------- CMP within normal limits, no emergent electrolyte derangement, AKI, LFT elevations or gap. CBC shows mild leukocytosis of 12.0, mild anemia 11.2, no thrombocytopenia.  Platelets 473.  These abnormalities are similar to prior around 2 years ago. Pregnancy test negative  DG Lumbar Spine:  IMPRESSION:  Transitional lumbosacral vertebra with assimilation joints  bilaterally at this level. Spina bifida occulta at this level is  stable.  No fracture or spondylolisthesis. No appreciable  arthropathy.  - Patient was reassessed continued severe pain.  She does have sensation in all toes, strong equal pedal pulses.  Will obtain MRI. - MRI Thoracic/Lumbar:  IMPRESSION:  MR THORACIC SPINE IMPRESSION    Normal.    MR LUMBAR SPINE IMPRESSION    1. No acute finding.  2. Disc and facet degeneration without neural impingement. The  facets at L4-5 are particularly degenerated.  3. Partially covered pelvic cyst measuring up to 7.5 cm. Usually of  ovarian origin, pelvic ultrasound follow-up is recommended at this  size.   = Patient reassessed resting comfortably no acute distress pain improving.  Suspect patient symptoms secondary to sciatica today, will start on muscle relaxer, prednisone and Lidoderm patches.  Patient denies any history of adverse reaction to prednisone and denies history of diabetes as well.  Patient made aware of muscle relaxer precautions and states understanding today.  Patient encouraged to call primary care provider to schedule follow-up visit.  Patient was advised of the pelvic cyst seen today and that she will need a follow-up ultrasound she plans to call her OB/GYN/PCP today to schedule that in the near future.  Patient was also given referral to on-call orthopedic surgeon Dr. Doreatha Martin for follow-up regarding her disc and facet degeneration  At this time there does not appear to be any evidence of an acute emergency medical condition and the patient appears stable for discharge with appropriate outpatient follow up. Diagnosis was discussed with patient who verbalizes understanding of care plan and is agreeable to discharge. I have discussed return precautions with patient and husband who verbalizes understanding. Patient encouraged to follow-up with their PCP. All questions answered.  Patient's case discussed with Dr. Tyrone Nine who agrees with plan to discharge with follow-up.   Note: Portions of this report may have been transcribed using voice recognition software. Every effort was made to ensure accuracy; however, inadvertent computerized transcription errors may still be present.   Deliah Boston, PA-C 11/29/20 [place], [place], DO 11/29/20 1357

## 2020-11-29 NOTE — ED Notes (Signed)
Pt returned from MRI °

## 2020-11-29 NOTE — Discharge Instructions (Signed)
At this time there does not appear to be the presence of an emergent medical condition, however there is always the potential for conditions to change. Please read and follow the below instructions.  Please return to the Emergency Department immediately for any new or worsening symptoms. Please be sure to follow up with your Primary Care Provider within one week regarding your visit today; please call their office to schedule an appointment even if you are feeling better for a follow-up visit. You may use the muscle relaxer Robaxin as prescribed to help with your symptoms.  Do not drive or operate heavy machinery while taking Robaxin as it will make you drowsy.  Do not drink alcohol or take other sedating medications while taking Robaxin as this will worsen side effects. You may use the Lidoderm patch as prescribed to help with your symptoms.  Lidoderm may be expensive so you may speak with your pharmacist about finding over-the-counter medications that work similarly. You were given a pain medication in the ER which will make you drowsy.  Do not drive, drink alcohol or perform any potentially dangerous activities for the rest of the day. You may use the steroid medication prednisone as prescribed to help with sciatica symptoms. Your x-ray today showed spina bifida occulta.  Please discuss this with your primary care provider at your follow-up visit.  Your MRI today showed degeneration of your spine which may be contributing to your pain today.  You may follow-up with your primary care provider or the on-call orthopedic specialist Dr. Doreatha Martin for follow-up visit. Additionally your MRI today showed a part of a large cyst in your pelvis this will need a follow-up picture with an ultrasound.  Please discuss this with your primary care provider or OB/GYN today to schedule that ultrasound.  Go to the nearest Emergency Department immediately if: You have fever or chills You cannot control when you pee  (urinate) or poop (have a bowel movement). You have weakness in any of these areas and it gets worse: Lower back. The area between your hip bones. Butt. Legs. You have redness or swelling of your back. You have a burning feeling when you pee. You have any new/concerning or worsening of symptoms.  Please read the additional information packets attached to your discharge summary.  Do not take your medicine if  develop an itchy rash, swelling in your mouth or lips, or difficulty breathing; call 911 and seek immediate emergency medical attention if this occurs.  You may review your lab tests and imaging results in their entirety on your MyChart account.  Please discuss all results of fully with your primary care provider and other specialist at your follow-up visit.  Note: Portions of this text may have been transcribed using voice recognition software. Every effort was made to ensure accuracy; however, inadvertent computerized transcription errors may still be present.

## 2020-11-29 NOTE — ED Triage Notes (Signed)
Brought by EMS from home. Laying in bed and went to turn over and twisted her back. States that she felt a pop. Now has pain in rt lower back and down rt leg. Hx of sciatica in same leg. Per pt she is unable to move her rt leg. States that she can't move anything below her waist on her rt side.

## 2020-11-29 NOTE — ED Notes (Signed)
Patient transported to X-ray 

## 2020-11-29 NOTE — ED Notes (Signed)
Provider at bedside at this time

## 2020-11-29 NOTE — ED Provider Notes (Signed)
[facility] EMERGENCY DEPARTMENT Provider Note   CSN: [id number] Arrival date & time: 11/29/20  6440     History Chief Complaint  Patient presents with  . Back Pain    Phyllis Dalton is a 30 y.o. female presents to the Emergency Department complaining of acute, persistent lower and right lower back pain onset just prior to arrival.  Patient reports she was laying in the bed and when she turned over she heard something "pop."  Patient reports she had immediate pain in her low back that radiates down her right leg.  She reports a history of right-sided sciatica however tonight's pain seems worse.  She reports inability to feel or move her right leg.  She denies saddle anesthesia, loss of bowel or bladder control.  No treatments prior to arrival.  Patient denies history of IV drug use, falls or trauma, previous back surgery or anticoagulant usage.  The history is provided by the patient and medical records. No language interpreter was used.       Past Medical History:  Diagnosis Date  . Depression   . Diabetes mellitus without complication ([place])   . Gestational diabetes   . Obesity   . Panic attack   . Thrombocytosis 09/20/2017  . Trichomonal vaginitis     Patient Active Problem List   Diagnosis Date Noted  . 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 SVD (spontaneous vaginal delivery) 04/05/2018  . Major depressive disorder, single episode, severe, without mention of psychotic behavior--hospitalized during pregnancy at [facility] 03/21/2013  . Panic attack     Past Surgical History:  Procedure Laterality Date  . MOUTH SURGERY    . TONSILLECTOMY    . TONSILLECTOMY AND ADENOIDECTOMY    . TUBAL LIGATION N/A 04/06/2018   Procedure: POST PARTUM TUBAL LIGATION;  Surgeon: Waymon Amato, MD;  Location: [facility];  Service: Gynecology;  Laterality: N/A;     OB History    Gravida  3   Para  3   Term  3   Preterm  0   AB  0   Living  3     SAB   0   IAB  0   Ectopic  0   Multiple  0   Live Births  3           Family History  Problem Relation Age of Onset  . Diabetes Mother   . HIV Mother   . Diabetes Other     Social History   Tobacco Use  . Smoking status: Former Smoker    Packs/day: 0.50    Types: Cigarettes    Quit date: 12/25/2012    Years since quitting: 7.9  . Smokeless tobacco: Never Used  Vaping Use  . Vaping Use: Never used  Substance Use Topics  . Alcohol use: No    Comment: rare  . Drug use: No    Types: Marijuana    Comment: 11/28/17    Home Medications Prior to Admission medications   Medication Sig Start Date End Date Taking? Authorizing Provider  cyclobenzaprine (FLEXERIL) 10 MG tablet Take 1 tablet (10 mg total) by mouth 2 (two) times daily as needed for muscle spasms. 07/25/18   Ashley Murrain, NP  doxycycline (VIBRAMYCIN) 100 MG capsule Take 1 capsule (100 mg total) by mouth 2 (two) times daily. 08/10/20   [name], Barbette Hair, MD  ibuprofen (ADVIL,MOTRIN) 600 MG tablet Take 1 tablet (600 mg total) by mouth every 6 (  six) hours as needed for mild pain or moderate pain. 04/07/18   Marikay Alar, CNM  naproxen (NAPROSYN) 500 MG tablet Take 1 tablet (500 mg total) by mouth 2 (two) times daily. 07/25/18   Ashley Murrain, NP  ondansetron (ZOFRAN ODT) 8 MG disintegrating tablet Take 1 tablet (8 mg total) by mouth 2 (two) times daily. 01/08/18   Noralyn Pick, FNP  sulfamethoxazole-trimethoprim (BACTRIM DS) 800-160 MG tablet Take 1 tablet by mouth 2 (two) times daily. 04/22/19   Fransico Meadow, PA-C    Allergies    Patient has no known allergies.  Review of Systems   Review of Systems  Constitutional: Negative for appetite change, diaphoresis, fatigue, fever and unexpected weight change.  HENT: Negative for mouth sores.   Eyes: Negative for visual disturbance.  Respiratory: Negative for cough, chest tightness, shortness of breath and wheezing.   Cardiovascular: Negative for chest pain.   Gastrointestinal: Negative for abdominal pain, constipation, diarrhea, nausea and vomiting.  Endocrine: Negative for polydipsia, polyphagia and polyuria.  Genitourinary: Negative for dysuria, frequency, hematuria and urgency.  Musculoskeletal: Positive for back pain and gait problem. Negative for neck stiffness.  Skin: Negative for rash.  Allergic/Immunologic: Negative for immunocompromised state.  Neurological: Positive for weakness and numbness. Negative for syncope, light-headedness and headaches.  Hematological: Does not bruise/bleed easily.  Psychiatric/Behavioral: Negative for sleep disturbance. The patient is not nervous/anxious.     Physical Exam Updated Vital Signs BP 102/68 (BP Location: Right Arm)   Pulse 95   Temp 98.4 F (36.9 C) (Oral)   Resp (!) 26   Ht 5\' 7"  (1.702 m)   Wt 117.9 kg   SpO2 100%   BMI 40.72 kg/m   Physical Exam Vitals and nursing note reviewed.  Constitutional:      General: She is not in acute distress.    Appearance: She is not diaphoretic.     Comments: Patient lying prone in bed crying.  HENT:     Head: Normocephalic.  Eyes:     General: No scleral icterus.    Conjunctiva/sclera: Conjunctivae normal.  Cardiovascular:     Rate and Rhythm: Normal rate and regular rhythm.     Pulses: Normal pulses.          Radial pulses are 2+ on the right side and 2+ on the left side.  Pulmonary:     Effort: No tachypnea, accessory muscle usage, prolonged expiration, respiratory distress or retractions.     Breath sounds: No stridor.     Comments: Equal chest rise. No increased work of breathing. Abdominal:     General: There is no distension.     Palpations: Abdomen is soft.     Tenderness: There is no abdominal tenderness. There is no guarding or rebound.  Musculoskeletal:     Cervical back: Normal and normal range of motion.     Thoracic back: Normal.     Lumbar back: Tenderness and bony tenderness present. Decreased range of motion.      Comments: Moves his upper extremities without difficulty.  Moves left lower extremity without difficulty.  Does not move right extremity.  Skin:    General: Skin is warm and dry.     Capillary Refill: Capillary refill takes less than 2 seconds.  Neurological:     Mental Status: She is alert.     GCS: GCS eye subscore is 4. GCS verbal subscore is 5. GCS motor subscore is 6.     Comments: Speech is clear  and goal oriented. Strength 5/5 in the bilateral upper extremities Strength 5/5 in the left lower extremity; patient does not move right lower extremity Subjective sensation intact to the bilateral upper extremities and left lower extremity.  Patient reports she cannot feel anything on her right lower extremity. Gait testing deferred as patient reports she cannot walk  Psychiatric:        Mood and Affect: Mood normal.     ED Results / Procedures / Treatments   Labs (all labs ordered are listed, but only abnormal results are displayed) Labs Reviewed  CBC WITH DIFFERENTIAL/PLATELET - Abnormal; Notable for the following components:      Result Value   WBC 12.0 (*)    Hemoglobin 11.5 (*)    MCV 79.8 (*)    MCH 25.3 (*)    Platelets 473 (*)    Neutro Abs 9.4 (*)    All other components within normal limits  COMPREHENSIVE METABOLIC PANEL  I-STAT BETA HCG BLOOD, ED ([facility], [facility], [facility] ONLY)    EKG None  Radiology No results found.  Procedures Procedures   Medications Ordered in ED Medications  HYDROmorphone (DILAUDID) injection 1 mg (1 mg Intravenous Given 11/29/20 0527)  dexamethasone (DECADRON) injection 10 mg (10 mg Intravenous Given 11/29/20 0527)  methocarbamol (ROBAXIN) 1,000 mg in dextrose 5 % 100 mL IVPB (0 mg Intravenous Stopped 11/29/20 0973)    ED Course  I have reviewed the triage vital signs and the nursing notes.  Pertinent labs & imaging results that were available during my care of the patient were reviewed by me and considered in my medical decision making (see chart  for details).  Clinical Course as of 11/29/20 0707  Sun Nov 29, 2020  5329 Patient with improvement in pain after medications ordered.  Is beginning to move her right leg some.  Continues to report that it is numb. [HM]    Clinical Course User Index [HM] Analiese Krupka, Gwenlyn Perking   MDM Rules/Calculators/A&P                           Patient presents with low back pain similar to previous episodes of sciatica.  Reports that she is unable to feel her right leg or move her right leg.  States she cannot walk.  Will give pain control and reassess.  7:06 AM Patient with slightly improved pain.  Is beginning to move right leg.  Labs reassuring.  Awaiting plain films.  Patient remains with neurologic deficits after adequate pain control will need MRI.  At shift change care was transferred to [facility] who will follow pending studies, re-evaulate and determine disposition.     Final Clinical Impression(s) / ED Diagnoses Final diagnoses:  Acute right-sided low back pain with right-sided sciatica    Rx / DC Orders ED Discharge Orders    None       Jaysun Wessels, Gwenlyn Perking 92/42/68 3419    Delora Fuel, MD 62/22/97 [phone]

## 2021-05-30 ENCOUNTER — Emergency Department (HOSPITAL_BASED_OUTPATIENT_CLINIC_OR_DEPARTMENT_OTHER)
Admission: EM | Admit: 2021-05-30 | Discharge: 2021-05-31 | Disposition: A | Discharge status: Home / Self Care | Payer: Medicaid Other | Attending: Emergency Medicine | Admitting: Emergency Medicine

## 2021-05-30 ENCOUNTER — Other Ambulatory Visit: Payer: Self-pay

## 2021-05-30 ENCOUNTER — Encounter (HOSPITAL_BASED_OUTPATIENT_CLINIC_OR_DEPARTMENT_OTHER): Payer: Self-pay | Admitting: Emergency Medicine

## 2021-05-30 DIAGNOSIS — J029 Acute pharyngitis, unspecified: Secondary | ICD-10-CM | POA: Insufficient documentation

## 2021-05-30 DIAGNOSIS — Z20822 Contact with and (suspected) exposure to covid-19: Secondary | ICD-10-CM | POA: Insufficient documentation

## 2021-05-30 DIAGNOSIS — R000 Tachycardia, unspecified: Secondary | ICD-10-CM | POA: Diagnosis not present

## 2021-05-30 DIAGNOSIS — J069 Acute upper respiratory infection, unspecified: Secondary | ICD-10-CM | POA: Insufficient documentation

## 2021-05-30 DIAGNOSIS — E119 Type 2 diabetes mellitus without complications: Secondary | ICD-10-CM | POA: Diagnosis not present

## 2021-05-30 DIAGNOSIS — Z87891 Personal history of nicotine dependence: Secondary | ICD-10-CM | POA: Diagnosis not present

## 2021-05-30 DIAGNOSIS — R059 Cough, unspecified: Secondary | ICD-10-CM | POA: Diagnosis present

## 2021-05-30 LAB — RESP PANEL BY RT-PCR (FLU A&B, COVID) ARPGX2
Influenza A by PCR: NEGATIVE
Influenza B by PCR: NEGATIVE
SARS Coronavirus 2 by RT PCR: NEGATIVE

## 2021-05-30 LAB — GROUP A STREP BY PCR: Group A Strep by PCR: NOT DETECTED

## 2021-05-30 NOTE — ED Triage Notes (Signed)
Pt c/o sore throat, HA, cough, nasal congestion

## 2021-05-30 NOTE — Discharge Instructions (Addendum)
Please follow-up with your primary care provider if your symptoms worsen or fail to improve.  Please return for further evaluation in the emergency department if you begin developing chest pain, shortness of breath, inability to swallow solid food.  Use ibuprofen, Tylenol, Mucinex as needed for symptomatic relief.

## 2021-05-30 NOTE — ED Provider Notes (Signed)
[facility] Provider Note   CSN: [id number] Arrival date & time: 05/30/21  2116     History Chief Complaint  Patient presents with   Cough   Sore Throat    Phyllis Dalton is a 30 y.o. female with no significant past medical history presents with 1 day of sore throat, headache, cough, nasal congestion.  Patient reports that she has taken some Mucinex, and Tylenol with minimal relief.  She denies fever, chills.  Patient reports that headache began gradually, is present all over the head.  Patient has no vision changes, weakness, or other neurologic deficit.  Patient is not immunosuppressed, no neck stiffness.  Patient denies any recent sick contacts.  Patient does report some ear pain bilaterally.   Cough Associated symptoms: headaches and rhinorrhea   Sore Throat Associated symptoms include headaches.      Past Medical History:  Diagnosis Date   Depression    Diabetes mellitus without complication ([place])    Gestational diabetes    Obesity    Panic attack    Thrombocytosis 09/20/2017   Trichomonal vaginitis     Patient Active Problem List   Diagnosis Date Noted   Prolonged latent phase of labor 04/05/2018   SVD (spontaneous vaginal delivery) 04/05/2018   Major depressive disorder, single episode, severe, without mention of psychotic behavior--hospitalized during pregnancy at [facility] 03/21/2013   Panic attack     Past Surgical History:  Procedure Laterality Date   [place] N/A 04/06/2018   Procedure: POST PARTUM TUBAL LIGATION;  Surgeon: Waymon Amato, MD;  Location: [facility];  Service: Gynecology;  Laterality: N/A;     OB History     Gravida  3   Para  3   Term  3   Preterm  0   AB  0   Living  3      SAB  0   IAB  0   Ectopic  0   Multiple  0   Live Births  3           Family History  Problem Relation Age of Onset    Diabetes Mother    HIV Mother    Diabetes Other     Social History   Tobacco Use   Smoking status: Former    Packs/day: 0.50    Types: Cigarettes    Quit date: 12/25/2012    Years since quitting: 8.4   Smokeless tobacco: Never  Vaping Use   Vaping Use: Never used  Substance Use Topics   Alcohol use: No    Comment: rare   Drug use: No    Types: Marijuana    Comment: 11/28/17    Home Medications Prior to Admission medications   Medication Sig Start Date End Date Taking? Authorizing Provider  lidocaine (LIDODERM) 5 % Place 1 patch onto the skin daily. Remove & Discard patch within 12 hours or as directed by MD 11/29/20   Deliah Boston, PA-C    Allergies    Patient has no known allergies.  Review of Systems   Review of Systems  HENT:  Positive for rhinorrhea.   Respiratory:  Positive for cough.   Neurological:  Positive for headaches.  All other systems reviewed and are negative.  Physical Exam Updated Vital Signs BP 105/76   Pulse (!) 101   Temp 98.1 F (36.7 C) (  Oral)   Resp (!) 21   Ht 5\' 10"  (1.778 m)   Wt 119.3 kg   LMP 05/23/2021   SpO2 96%   BMI 37.74 kg/m   Physical Exam Vitals and nursing note reviewed.  Constitutional:      General: She is not in acute distress.    Appearance: Normal appearance.  HENT:     Head: Normocephalic and atraumatic.     Right Ear: Tympanic membrane and ear canal normal. No drainage. Tympanic membrane is not erythematous.     Left Ear: Tympanic membrane and ear canal normal. No drainage. Tympanic membrane is not erythematous.     Nose: Rhinorrhea present.     Mouth/Throat:     Tonsils: No tonsillar exudate or tonsillar abscesses. 1+ on the right. 1+ on the left.  Eyes:     General:        Right eye: No discharge.        Left eye: No discharge.  Cardiovascular:     Rate and Rhythm: Normal rate and regular rhythm.     Comments: Tachycardia and tachypnea on triage vitals, normal heart rate, normal respiratory rate on  my evaluation. Pulmonary:     Effort: Pulmonary effort is normal. No respiratory distress.  Musculoskeletal:        General: No deformity.     Cervical back: Normal range of motion and neck supple.  Lymphadenopathy:     Cervical: No cervical adenopathy.  Skin:    General: Skin is warm and dry.     Capillary Refill: Capillary refill takes less than 2 seconds.  Neurological:     General: No focal deficit present.     Mental Status: She is alert and oriented to person, place, and time.     Cranial Nerves: No cranial nerve deficit.  Psychiatric:        Mood and Affect: Mood normal.        Behavior: Behavior normal.    ED Results / Procedures / Treatments   Labs (all labs ordered are listed, but only abnormal results are displayed) Labs Reviewed  RESP PANEL BY RT-PCR (FLU A&B, COVID) [id number]  GROUP A STREP BY PCR    EKG None  Radiology No results found.  Procedures Procedures   Medications Ordered in ED Medications - No data to display  ED Course  I have reviewed the triage vital signs and the nursing notes.  Pertinent labs & imaging results that were available during my care of the patient were reviewed by me and considered in my medical decision making (see chart for details).    MDM Rules/Calculators/A&P                         Patient with signs and symptoms of an upper respiratory infection.  Strep, COVID, flu tests are all negative.  Patient does have a headache at this time, however headache started gradually, is located all over, is not associated with any neurologic deficit.  Patient does not have any red flag symptoms including immunosuppression, neck pain, fever, or neurologic deficits at this time.  Patient has no evidence of tonsillar exudate, peritonsillar abscess, or other intraoral abnormality.  Recommend symptomatic control with Tylenol, ibuprofen, Mucinex, encourage rest, fluids.  Discussed she may still have an early case of COVID, flu that was not  detected on PCR today versus other viral upper respiratory illness.  Discussed this is a self-limited infection and should resolve with no  difficulty, with time.  Recommend reevaluation if she begins to develop significant difficulty swallowing, shortness of breath, or chest pain.  Return precautions given, patient discharged in stable condition. Final Clinical Impression(s) / ED Diagnoses Final diagnoses:  Viral upper respiratory tract infection  Sore throat    Rx / DC Orders ED Discharge Orders     None        Anselmo Pickler, PA-C 05/30/21 2343    Orpah Greek, MD 05/31/21 [id number]

## 2021-06-24 ENCOUNTER — Encounter (HOSPITAL_BASED_OUTPATIENT_CLINIC_OR_DEPARTMENT_OTHER): Payer: Self-pay | Admitting: *Deleted

## 2021-06-24 ENCOUNTER — Emergency Department (HOSPITAL_BASED_OUTPATIENT_CLINIC_OR_DEPARTMENT_OTHER)
Admission: EM | Admit: 2021-06-24 | Discharge: 2021-06-25 | Disposition: A | Discharge status: Home / Self Care | Payer: Medicaid Other | Attending: Emergency Medicine | Admitting: Emergency Medicine

## 2021-06-24 ENCOUNTER — Other Ambulatory Visit: Payer: Self-pay

## 2021-06-24 DIAGNOSIS — J101 Influenza due to other identified influenza virus with other respiratory manifestations: Secondary | ICD-10-CM | POA: Diagnosis not present

## 2021-06-24 DIAGNOSIS — Z20822 Contact with and (suspected) exposure to covid-19: Secondary | ICD-10-CM | POA: Diagnosis not present

## 2021-06-24 DIAGNOSIS — R509 Fever, unspecified: Secondary | ICD-10-CM | POA: Diagnosis present

## 2021-06-24 DIAGNOSIS — E119 Type 2 diabetes mellitus without complications: Secondary | ICD-10-CM | POA: Diagnosis not present

## 2021-06-24 DIAGNOSIS — Z87891 Personal history of nicotine dependence: Secondary | ICD-10-CM | POA: Diagnosis not present

## 2021-06-24 LAB — RESP PANEL BY RT-PCR (FLU A&B, COVID) ARPGX2
Influenza A by PCR: POSITIVE — AB
Influenza B by PCR: NEGATIVE
SARS Coronavirus 2 by RT PCR: NEGATIVE

## 2021-06-24 NOTE — ED Triage Notes (Signed)
[place] fever and vomiting x 4 days , exposed to flu

## 2021-06-25 MED ORDER — ONDANSETRON 4 MG PO TBDP: 8.0000 mg | ORAL_TABLET | Freq: Once | ORAL | Status: DC

## 2021-06-25 MED ORDER — ONDANSETRON 8 MG PO TBDP: 8.0000 mg | ORAL_TABLET | Freq: Three times a day (TID) | ORAL | 0 refills | Status: DC | PRN

## 2021-06-25 NOTE — ED Provider Notes (Signed)
[facility] Provider Note: Georgena Spurling, MD, FACEP  CSN: [id number] MRN: [id number] ARRIVAL: 06/24/21 at 2100 ROOM: [place]  Fever   HISTORY OF PRESENT ILLNESS  06/25/21 1:03 AM Phyllis Dalton is a 30 y.o. female with 5 days of low-grade fever, cough, mild shortness of breath, vomiting (unclear if this is posttussive) and diarrhea.  Symptoms are mild to moderate.  She has not been taking anything for her symptoms.  She has been exposed to flu and both of her children who accompany her tested positive for flu.   Past Medical History:  Diagnosis Date   Depression    Diabetes mellitus without complication ([place])    Gestational diabetes    Obesity    Panic attack    Thrombocytosis 09/20/2017   Trichomonal vaginitis     Past Surgical History:  Procedure Laterality Date   MOUTH SURGERY     TONSILLECTOMY     TONSILLECTOMY AND ADENOIDECTOMY     TUBAL LIGATION N/A 04/06/2018   Procedure: POST PARTUM TUBAL LIGATION;  Surgeon: Waymon Amato, MD;  Location: [facility];  Service: Gynecology;  Laterality: N/A;    Family History  Problem Relation Age of Onset   Diabetes Mother    HIV Mother    Diabetes Other     Social History   Tobacco Use   Smoking status: Former    Packs/day: 0.50    Types: Cigarettes    Quit date: 12/25/2012    Years since quitting: 8.5   Smokeless tobacco: Never  Vaping Use   Vaping Use: Never used  Substance Use Topics   Alcohol use: No    Comment: rare   Drug use: No    Types: Marijuana    Comment: 11/28/17    Prior to Admission medications   Medication Sig Start Date End Date Taking? Authorizing Provider  ondansetron (ZOFRAN ODT) 8 MG disintegrating tablet Take 1 tablet (8 mg total) by mouth every 8 (eight) hours as needed for nausea or vomiting. 06/25/21  Yes Naryah Clenney, MD  lidocaine (LIDODERM) 5 % Place 1 patch onto the skin daily. Remove & Discard patch within 12 hours or as directed by MD 11/29/20    Deliah Boston, PA-C    Allergies Patient has no known allergies.   REVIEW OF SYSTEMS  Negative except as noted here or in the History of Present Illness.   PHYSICAL EXAMINATION  Initial Vital Signs Blood pressure 104/77, pulse (!) 106, temperature 98.2 F (36.8 C), temperature source Oral, resp. rate 20, weight 118.4 kg, last menstrual period 06/21/2021, SpO2 96 %, unknown if currently breastfeeding.  Examination General: Well-developed, well-nourished female in no acute distress; appearance consistent with age of record HENT: normocephalic; atraumatic Eyes: Appearance Neck: supple Heart: regular rate and rhythm Lungs: clear to auscultation bilaterally Abdomen: soft; nondistended; nontender; bowel sounds present Extremities: No deformity; full range of motion Neurologic: Awake, alert and oriented; motor function intact in all extremities and symmetric; no facial droop Skin: Warm and dry Psychiatric: Normal mood and affect   RESULTS  Summary of this visit's results, reviewed and interpreted by myself:   EKG Interpretation  Date/Time:    Ventricular Rate:    PR Interval:    QRS Duration:   QT Interval:    QTC Calculation:   R Axis:     Text Interpretation:         Laboratory Studies: Results for orders placed or performed during the hospital encounter  of 06/24/21 (from the past 24 hour(s))  Resp Panel by RT-PCR (Flu A&B, Covid) Nasopharyngeal Swab     Status: Abnormal   Collection Time: 06/24/21 10:46 PM   Specimen: Nasopharyngeal Swab; Nasopharyngeal(NP) swabs in vial transport medium  Result Value Ref Range   SARS Coronavirus 2 by RT PCR NEGATIVE NEGATIVE   Influenza A by PCR POSITIVE (A) NEGATIVE   Influenza B by PCR NEGATIVE NEGATIVE   Imaging Studies: No results found.  ED COURSE and MDM  Nursing notes, initial and subsequent vitals signs, including pulse oximetry, reviewed and interpreted by myself.  Vitals:   06/24/21 2113 06/24/21 2114  BP:  104/77   Pulse: (!) 106   Resp: 20   Temp: 98.2 F (36.8 C)   TempSrc: Oral   SpO2: 96%   Weight:  118.4 kg   Medications  ondansetron (ZOFRAN-ODT) disintegrating tablet 8 mg (has no administration in time range)    We will treat with Zofran for nausea and vomiting.  She was advised she may take over-the-counter cough/cold medications and Imodium as needed for symptomatic relief.  PROCEDURES  Procedures   ED DIAGNOSES     ICD-10-CM   1. Influenza A  J10.1          Genoa Freyre, MD 06/25/21 5945

## 2022-02-02 ENCOUNTER — Ambulatory Visit (HOSPITAL_COMMUNITY)
Admission: EM | Admit: 2022-02-02 | Discharge: 2022-02-02 | Disposition: A | Discharge status: Home / Self Care | Payer: Medicaid Other | Attending: Family Medicine | Admitting: Family Medicine

## 2022-02-02 ENCOUNTER — Other Ambulatory Visit: Payer: Self-pay

## 2022-02-02 ENCOUNTER — Encounter (HOSPITAL_COMMUNITY): Payer: Self-pay | Admitting: Emergency Medicine

## 2022-02-02 DIAGNOSIS — J069 Acute upper respiratory infection, unspecified: Secondary | ICD-10-CM | POA: Diagnosis not present

## 2022-02-02 DIAGNOSIS — Z20822 Contact with and (suspected) exposure to covid-19: Secondary | ICD-10-CM | POA: Diagnosis not present

## 2022-02-02 DIAGNOSIS — J029 Acute pharyngitis, unspecified: Secondary | ICD-10-CM | POA: Insufficient documentation

## 2022-02-02 DIAGNOSIS — R059 Cough, unspecified: Secondary | ICD-10-CM | POA: Insufficient documentation

## 2022-02-02 LAB — SARS CORONAVIRUS 2 (TAT 6-24 HRS): SARS Coronavirus 2: NEGATIVE

## 2022-02-02 MED ORDER — BENZONATATE 100 MG PO CAPS: 100.0000 mg | ORAL_CAPSULE | Freq: Three times a day (TID) | ORAL | 0 refills | Status: AC | PRN

## 2022-02-02 NOTE — ED Triage Notes (Signed)
On Monday cough started.  Throat is starting to hurt, ears are itching, has a headache, and is coughing so hard she vomits at times.  Patient has not taken any medications for symptoms

## 2022-02-02 NOTE — ED Provider Notes (Signed)
[facility]    CSN: [id number] Arrival date & time: 02/02/22  8315      History   Chief Complaint Chief Complaint  Patient presents with   Cough    HPI CAILIE BOSSHART is a 31 y.o. female.   Cough Started 2 days ago No fevers  Endorses congestion, rhinorrhea, sore throat Has had few post-tussive emesis episodes Denies  fatigue, myalgias, nausea, vomiting, diarrhea, chest pain, shortness of breath Has been drinking normally, has normal UOP  Children sick with similar symptoms Has not been tested for COVID      Past Medical History:  Diagnosis Date   Depression    Diabetes mellitus without complication ([place])    Gestational diabetes    Obesity    Panic attack    Thrombocytosis 09/20/2017   Trichomonal vaginitis     Patient Active Problem List   Diagnosis Date Noted   Prolonged latent phase of labor 04/05/2018   SVD (spontaneous vaginal delivery) 04/05/2018   Major depressive disorder, single episode, severe, without mention of psychotic behavior--hospitalized during pregnancy at [facility] 03/21/2013   Panic attack     Past Surgical History:  Procedure Laterality Date   MOUTH SURGERY     TONSILLECTOMY     TONSILLECTOMY AND ADENOIDECTOMY     TUBAL LIGATION N/A 04/06/2018   Procedure: POST PARTUM TUBAL LIGATION;  Surgeon: Waymon Amato, MD;  Location: [facility];  Service: Gynecology;  Laterality: N/A;    OB History     Gravida  3   Para  3   Term  3   Preterm  0   AB  0   Living  3      SAB  0   IAB  0   Ectopic  0   Multiple  0   Live Births  3            Home Medications    Prior to Admission medications   Medication Sig Start Date End Date Taking? Authorizing Provider  benzonatate (TESSALON) 100 MG capsule Take 1 capsule (100 mg total) by mouth 3 (three) times daily as needed for cough. 02/02/22  Yes Akash Winski, Bernita Raisin, DO  clonazePAM (KLONOPIN) 0.5 MG tablet Take 0.5 mg by mouth 2 (two) times daily as needed.  09/20/21   [provider]  cyclobenzaprine (FLEXERIL) 10 MG tablet Take 10 mg by mouth 3 (three) times daily as needed. 11/16/21   [provider]  lidocaine (LIDODERM) 5 % Place 1 patch onto the skin daily. Remove & Discard patch within 12 hours or as directed by MD Patient not taking: Reported on 02/02/2022 11/29/20   Nuala Alpha A, PA-C  ondansetron (ZOFRAN ODT) 8 MG disintegrating tablet Take 1 tablet (8 mg total) by mouth every 8 (eight) hours as needed for nausea or vomiting. Patient not taking: Reported on 02/02/2022 06/25/21   Molpus, John, MD  oxyCODONE-acetaminophen (PERCOCET) 10-325 MG tablet Take 1 tablet by mouth 3 (three) times daily as needed. 01/19/22   [provider]  QUEtiapine (SEROQUEL) 50 MG tablet Take 50 mg by mouth at bedtime. 01/13/22   [provider]  sertraline (ZOLOFT) 50 MG tablet Take 50 mg by mouth every morning. 09/24/21   [provider]    Family History Family History  Problem Relation Age of Onset   Diabetes Mother    HIV Mother    Diabetes Other     Social History Social History   Tobacco Use  Smoking status: Former    Packs/day: 0.50    Types: Cigarettes    Quit date: 12/25/2012    Years since quitting: 9.1   Smokeless tobacco: Never  Vaping Use   Vaping Use: Never used  Substance Use Topics   Alcohol use: Yes    Comment: rare   Drug use: No    Types: Marijuana    Comment: 11/28/17     Allergies   Patient has no known allergies.   Review of Systems Review of Systems  All other systems reviewed and are negative.  Per HPI  Physical Exam Triage Vital Signs ED Triage Vitals  Enc Vitals Group     BP 02/02/22 0925 116/71     Pulse Rate 02/02/22 0925 79     Resp 02/02/22 0925 20     Temp 02/02/22 0925 98.1 F (36.7 C)     Temp Source 02/02/22 0925 Oral     SpO2 02/02/22 0925 100 %     Weight --      Height --      Head Circumference --      Peak Flow --      Pain Score 02/02/22 0921  8     Pain Loc --      Pain Edu? --      Excl. in [place]? --    No data found.  Updated Vital Signs BP 116/71 (BP Location: Left Arm) Comment (BP Location): large cuff  Pulse 79   Temp 98.1 F (36.7 C) (Oral)   Resp 20   LMP 01/26/2022   SpO2 100%   Visual Acuity Right Eye Distance:   Left Eye Distance:   Bilateral Distance:    Right Eye Near:   Left Eye Near:    Bilateral Near:     Physical Exam Constitutional:      General: She is not in acute distress.    Appearance: She is well-developed. She is not ill-appearing or toxic-appearing.  HENT:     Head: Normocephalic and atraumatic.     Right Ear: Tympanic membrane, ear canal and external ear normal.     Left Ear: Tympanic membrane, ear canal and external ear normal.     Nose: Congestion and rhinorrhea present.     Mouth/Throat:     Mouth: Mucous membranes are moist.     Pharynx: No oropharyngeal exudate or posterior oropharyngeal erythema.  Eyes:     Conjunctiva/sclera: Conjunctivae normal.  Cardiovascular:     Rate and Rhythm: Normal rate.  Pulmonary:     Effort: Pulmonary effort is normal. No respiratory distress.     Breath sounds: Normal breath sounds. No wheezing, rhonchi or rales.  Musculoskeletal:     Cervical back: Neck supple. No rigidity or tenderness.  Lymphadenopathy:     Cervical: No cervical adenopathy.  Skin:    General: Skin is warm and dry.     Capillary Refill: Capillary refill takes less than 2 seconds.  Neurological:     Mental Status: She is alert and oriented to person, place, and time.      UC Treatments / Results  Labs (all labs ordered are listed, but only abnormal results are displayed) Labs Reviewed  SARS CORONAVIRUS 2 (TAT 6-24 HRS)    EKG   Radiology No results found.  Procedures Procedures (including critical care time)  Medications Ordered in UC Medications - No data to display  Initial Impression / Assessment and Plan / UC Course  I have reviewed the  triage  vital signs and the nursing notes.  Pertinent labs & imaging results that were available during my care of the patient were reviewed by me and considered in my medical decision making (see chart for details).     VSS.  COVID test performed.  POC Influenza test deferred as would not change management.  Rx given for tessalon perles.  Advised of OTC treatments and ED precautions, see AVS.   Final Clinical Impressions(s) / UC Diagnoses   Final diagnoses:  Viral URI with cough     Discharge Instructions      We have tested you for COVID and the results will likely come back tomorrow.  If you have difficulty breathing, chest pain, you are vomiting and can't keep any liquids down and you aren't urinating at least 50% of your normal amount, you should be seen at the emergency room right away.  If you aren't improving over the next week, please follow up with your regular medical provider.  For your congestion, you can use nasal saline spray.  You can also use a humidifier.  You can also use honey as needed for cough by the spoonful or in a warm liquid (do not give honey to an infant less than a year old).       ED Prescriptions     Medication Sig Dispense Auth. Provider   benzonatate (TESSALON) 100 MG capsule Take 1 capsule (100 mg total) by mouth 3 (three) times daily as needed for cough. 21 capsule Safir Michalec, Bernita Raisin, DO      PDMP not reviewed this encounter.   Selena Swaminathan, Bernita Raisin, DO 02/02/22 1002

## 2022-02-02 NOTE — Discharge Instructions (Signed)
We have tested you for COVID and the results will likely come back tomorrow.  If you have difficulty breathing, chest pain, you are vomiting and can't keep any liquids down and you aren't urinating at least 50% of your normal amount, you should be seen at the emergency room right away.  If you aren't improving over the next week, please follow up with your regular medical provider.  For your congestion, you can use nasal saline spray.  You can also use a humidifier.  You can also use honey as needed for cough by the spoonful or in a warm liquid (do not give honey to an infant less than a year old).

## 2022-02-04 ENCOUNTER — Encounter (HOSPITAL_COMMUNITY): Payer: Self-pay

## 2022-02-04 ENCOUNTER — Ambulatory Visit (HOSPITAL_COMMUNITY)
Admission: EM | Admit: 2022-02-04 | Discharge: 2022-02-04 | Disposition: A | Discharge status: Home / Self Care | Payer: Medicaid Other | Attending: Emergency Medicine | Admitting: Emergency Medicine

## 2022-02-04 DIAGNOSIS — J069 Acute upper respiratory infection, unspecified: Secondary | ICD-10-CM

## 2022-02-04 LAB — POCT RAPID STREP A, ED / UC: Streptococcus, Group A Screen (Direct): NEGATIVE

## 2022-02-04 MED ORDER — IBUPROFEN 800 MG PO TABS
ORAL_TABLET | ORAL | Status: AC
  Filled 2022-02-04: qty 1

## 2022-02-04 MED ORDER — GUAIFENESIN ER 600 MG PO TB12: 600.0000 mg | ORAL_TABLET | Freq: Every day | ORAL | 1 refills | Status: AC

## 2022-02-04 MED ORDER — IBUPROFEN 800 MG PO TABS
800.0000 mg | ORAL_TABLET | Freq: Once | ORAL | Status: AC
  Administered 2022-02-04: 800 mg via ORAL

## 2022-02-04 NOTE — Discharge Instructions (Addendum)
Try daily mucinex for congestion. Continue taking the cough medicine 3 times a day. Use ibuprofen every 6 hours for headache and pain.  If your symptoms worsen, please go to the emergency department.

## 2022-02-04 NOTE — ED Triage Notes (Addendum)
Pt states seen and tx'd here 2 days ago for cough, sore throat, congestion, and ear pain. States not feeling any better.states needs an extended work note to be out til Monday.

## 2022-02-04 NOTE — ED Provider Notes (Signed)
[place]    CSN: [id number] Arrival date & time: 02/04/22  0841     History   Chief Complaint Chief Complaint  Patient presents with   Cough    HPI Phyllis Dalton is a 31 y.o. female.  Presents for continued cough.  She was seen 2 days ago for same.  Prescribed Tessalon which she has taken 2-3 times.  Does not have any improvement in symptoms.  COVID-negative at that visit.  She reports sore throat that is 10 out of 10 pain.  Also reports headache, 10/10 pain.  She has not tried any pain medicines.  Has not tried any decongestants. Denies fever, shortness of breath, chest pain, abdominal pain, vomiting/diarrhea.  Has continued to drink fluids.  Children at home sick with similar. History of T&A. Takes percocet daily Requesting note for work  Past Medical History:  Diagnosis Date   Depression    Diabetes mellitus without complication ([place])    Gestational diabetes    Obesity    Panic attack    Thrombocytosis 09/20/2017   Trichomonal vaginitis     Patient Active Problem List   Diagnosis Date Noted   Prolonged latent phase of labor 04/05/2018   SVD (spontaneous vaginal delivery) 04/05/2018   Major depressive disorder, single episode, severe, without mention of psychotic behavior--hospitalized during pregnancy at [facility] 03/21/2013   Panic attack     Past Surgical History:  Procedure Laterality Date   [place] N/A 04/06/2018   Procedure: POST PARTUM TUBAL LIGATION;  Surgeon: Waymon Amato, MD;  Location: [facility];  Service: Gynecology;  Laterality: N/A;    OB History     Gravida  3   Para  3   Term  3   Preterm  0   AB  0   Living  3      SAB  0   IAB  0   Ectopic  0   Multiple  0   Live Births  3            Home Medications    Prior to Admission medications   Medication Sig Start Date End Date Taking? Authorizing Provider  guaiFENesin  (MUCINEX) 600 MG 12 hr tablet Take 1 tablet (600 mg total) by mouth daily. 02/04/22  Yes Shayden Bobier, Wells Guiles, PA-C  benzonatate (TESSALON) 100 MG capsule Take 1 capsule (100 mg total) by mouth 3 (three) times daily as needed for cough. 02/02/22   Meccariello, Bernita Raisin, DO  clonazePAM (KLONOPIN) 0.5 MG tablet Take 0.5 mg by mouth 2 (two) times daily as needed. 09/20/21   [provider]  cyclobenzaprine (FLEXERIL) 10 MG tablet Take 10 mg by mouth 3 (three) times daily as needed. 11/16/21   [provider]  oxyCODONE-acetaminophen (PERCOCET) 10-325 MG tablet Take 1 tablet by mouth 3 (three) times daily as needed. 01/19/22   [provider]  QUEtiapine (SEROQUEL) 50 MG tablet Take 50 mg by mouth at bedtime. 01/13/22   [provider]  sertraline (ZOLOFT) 50 MG tablet Take 50 mg by mouth every morning. 09/24/21   [provider]    Family History Family History  Problem Relation Age of Onset   Diabetes Mother    HIV Mother    Diabetes Other     Social History Social History   Tobacco Use   Smoking status: Former    Packs/day:  0.50    Types: Cigarettes    Quit date: 12/25/2012    Years since quitting: 9.1   Smokeless tobacco: Never  Vaping Use   Vaping Use: Never used  Substance Use Topics   Alcohol use: Yes    Comment: rare   Drug use: No    Types: Marijuana    Comment: 11/28/17     Allergies   Patient has no known allergies.   Review of Systems Review of Systems  Respiratory:  Positive for cough.    Per HPI  Physical Exam Triage Vital Signs ED Triage Vitals [02/04/22 0942]  Enc Vitals Group     BP 96/68     Pulse Rate 86     Resp 18     Temp 98.6 F (37 C)     Temp Source Oral     SpO2 96 %     Weight      Height      Head Circumference      Peak Flow      Pain Score 10     Pain Loc      Pain Edu?      Excl. in [place]?    No data found.  Updated Vital Signs BP 96/68 (BP Location: Left Arm)   Pulse 86   Temp 98.6 F (37 C)  (Oral)   Resp 18   LMP 01/26/2022   SpO2 96%   Breastfeeding No   Physical Exam Vitals and nursing note reviewed.  Constitutional:      General: She is not in acute distress. HENT:     Right Ear: Tympanic membrane and ear canal normal.     Left Ear: Tympanic membrane and ear canal normal.     Nose: Congestion present.     Mouth/Throat:     Mouth: Mucous membranes are moist.     Pharynx: Oropharynx is clear. No oropharyngeal exudate or posterior oropharyngeal erythema.  Eyes:     Extraocular Movements: Extraocular movements intact.     Conjunctiva/sclera: Conjunctivae normal.     Pupils: Pupils are equal, round, and reactive to light.  Cardiovascular:     Rate and Rhythm: Normal rate and regular rhythm.     Heart sounds: Normal heart sounds.  Pulmonary:     Effort: Pulmonary effort is normal. No respiratory distress.     Breath sounds: Normal breath sounds. No rhonchi or rales.  Abdominal:     Palpations: Abdomen is soft.     Tenderness: There is no abdominal tenderness.  Musculoskeletal:        General: Normal range of motion.     Cervical back: Normal range of motion. No rigidity.  Lymphadenopathy:     Cervical: No cervical adenopathy.  Neurological:     General: No focal deficit present.     Mental Status: She is alert and oriented to person, place, and time.     Cranial Nerves: No facial asymmetry.     Sensory: Sensation is intact. No sensory deficit.     Motor: Motor function is intact. No weakness.     Coordination: Coordination is intact. Coordination normal.     Gait: Gait is intact. Gait normal.     UC Treatments / Results  Labs (all labs ordered are listed, but only abnormal results are displayed) Labs Reviewed  POCT RAPID STREP A, ED / UC   EKG  Radiology No results found.  Procedures Procedures  Medications Ordered in UC Medications  ibuprofen (ADVIL) tablet  800 mg (800 mg Oral Given 02/04/22 1036)    Initial Impression / Assessment and Plan  / UC Course  I have reviewed the triage vital signs and the nursing notes.  Pertinent labs & imaging results that were available during my care of the patient were reviewed by me and considered in my medical decision making (see chart for details).  Physical exam unremarkable. Neurologically intact.  Strep swab per patient request is negative. Dose of ibuprofen given for headache.  Improvement of pain in head and throat. Discussed with patient to try daily decongestant to help with congestion.  I recommend Mucinex.  She should alternate Tylenol and ibuprofen every 6 hours for throat pain and headache.  Continue to drink lots of fluids.  We discussed return precautions and patient agrees to plan.  She is discharged in stable condition.  Final Clinical Impressions(s) / UC Diagnoses   Final diagnoses:  Viral URI with cough     Discharge Instructions      Try daily mucinex for congestion. Continue taking the cough medicine 3 times a day. Use ibuprofen every 6 hours for headache and pain.  If your symptoms worsen, please go to the emergency department.    ED Prescriptions     Medication Sig Dispense Auth. Provider   guaiFENesin (MUCINEX) 600 MG 12 hr tablet Take 1 tablet (600 mg total) by mouth daily. 30 tablet Iliya Spivack, Wells Guiles, PA-C      PDMP not reviewed this encounter.   Destiny Hagin, Wells Guiles, [place] 02/04/22 1103

## 2022-09-29 ENCOUNTER — Encounter: Payer: Self-pay | Admitting: Gastroenterology

## 2022-10-25 ENCOUNTER — Ambulatory Visit: Payer: Medicaid Other | Admitting: Gastroenterology

## 2022-11-07 ENCOUNTER — Ambulatory Visit: Payer: Medicaid Other | Admitting: Gastroenterology

## 2022-11-14 ENCOUNTER — Ambulatory Visit: Payer: Medicaid Other | Admitting: Gastroenterology

## 2022-12-29 ENCOUNTER — Ambulatory Visit: Payer: Medicaid Other | Admitting: Gastroenterology

## 2023-04-24 ENCOUNTER — Emergency Department (HOSPITAL_COMMUNITY)
Admission: EM | Admit: 2023-04-24 | Discharge: 2023-04-24 | Disposition: A | Discharge status: Home / Self Care | Payer: Medicaid Other | Attending: Emergency Medicine | Admitting: Emergency Medicine

## 2023-04-24 ENCOUNTER — Emergency Department (HOSPITAL_COMMUNITY): Payer: Medicaid Other

## 2023-04-24 ENCOUNTER — Encounter (HOSPITAL_COMMUNITY): Payer: Self-pay

## 2023-04-24 ENCOUNTER — Other Ambulatory Visit: Payer: Self-pay

## 2023-04-24 DIAGNOSIS — X58XXXA Exposure to other specified factors, initial encounter: Secondary | ICD-10-CM | POA: Diagnosis not present

## 2023-04-24 DIAGNOSIS — M79674 Pain in right toe(s): Secondary | ICD-10-CM | POA: Diagnosis present

## 2023-04-24 NOTE — Discharge Instructions (Signed)
Please follow-up with your primary care provider regarding recent symptoms and ER visit.  Today your exam was negative for any fractures and had a reassuring physical exam.  You are given a postop shoe for your pain however you will need to follow-up with a primary care provider.  You can take Tylenol every 6 hours needed for pain and ice your toe.  If symptoms change or worsen please return to ER.
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
## 2024-01-29 ENCOUNTER — Emergency Department (HOSPITAL_BASED_OUTPATIENT_CLINIC_OR_DEPARTMENT_OTHER)

## 2024-01-29 ENCOUNTER — Encounter (HOSPITAL_BASED_OUTPATIENT_CLINIC_OR_DEPARTMENT_OTHER): Payer: Self-pay | Admitting: Emergency Medicine

## 2024-01-29 ENCOUNTER — Emergency Department (HOSPITAL_BASED_OUTPATIENT_CLINIC_OR_DEPARTMENT_OTHER): Admission: EM | Admit: 2024-01-29 | Discharge: 2024-01-29 | Disposition: A | Discharge status: Home / Self Care

## 2024-01-29 ENCOUNTER — Other Ambulatory Visit: Payer: Self-pay

## 2024-01-29 DIAGNOSIS — K219 Gastro-esophageal reflux disease without esophagitis: Secondary | ICD-10-CM | POA: Diagnosis not present

## 2024-01-29 DIAGNOSIS — R0789 Other chest pain: Secondary | ICD-10-CM | POA: Diagnosis present

## 2024-01-29 DIAGNOSIS — E119 Type 2 diabetes mellitus without complications: Secondary | ICD-10-CM | POA: Insufficient documentation

## 2024-01-29 LAB — CBC
HCT: 39 % (ref 36.0–46.0)
Hemoglobin: 12.5 g/dL (ref 12.0–15.0)
MCH: 25.6 pg — ABNORMAL LOW (ref 26.0–34.0)
MCHC: 32.1 g/dL (ref 30.0–36.0)
MCV: 79.8 fL — ABNORMAL LOW (ref 80.0–100.0)
Platelets: 506 10*3/uL — ABNORMAL HIGH (ref 150–400)
RBC: 4.89 MIL/uL (ref 3.87–5.11)
RDW: 13.6 % (ref 11.5–15.5)
WBC: 8.8 10*3/uL (ref 4.0–10.5)
nRBC: 0 % (ref 0.0–0.2)

## 2024-01-29 LAB — BASIC METABOLIC PANEL WITH GFR
Anion gap: 12 (ref 5–15)
BUN: 14 mg/dL (ref 6–20)
CO2: 23 mmol/L (ref 22–32)
Calcium: 9.4 mg/dL (ref 8.9–10.3)
Chloride: 103 mmol/L (ref 98–111)
Creatinine, Ser: 0.97 mg/dL (ref 0.44–1.00)
GFR, Estimated: >60 mL/min (ref >60)
Glucose, Bld: 99 mg/dL (ref 70–99)
Potassium: 3.7 mmol/L (ref 3.5–5.1)
Sodium: 138 mmol/L (ref 135–145)

## 2024-01-29 LAB — RESP PANEL BY RT-PCR (RSV, FLU A&B, COVID)  RVPGX2
Influenza A by PCR: NEGATIVE
Influenza B by PCR: NEGATIVE
Resp Syncytial Virus by PCR: NEGATIVE
SARS Coronavirus 2 by RT PCR: NEGATIVE

## 2024-01-29 LAB — PREGNANCY, URINE: Preg Test, Ur: NEGATIVE

## 2024-01-29 LAB — TROPONIN T, HIGH SENSITIVITY: Troponin T High Sensitivity: <15 ng/L (ref <19)

## 2024-01-29 LAB — HCG, QUANTITATIVE, PREGNANCY: hCG, Beta Chain, Quant, S: <1 m[IU]/mL (ref <5)

## 2024-01-29 MED ORDER — LIDOCAINE VISCOUS HCL 2 % MT SOLN
15.0000 mL | Freq: Once | OROMUCOSAL | Status: AC
  Administered 2024-01-29: 15 mL via ORAL
  Filled 2024-01-29: qty 15

## 2024-01-29 MED ORDER — MAALOX MAX 400-400-40 MG/5ML PO SUSP: 10.0000 mL | Freq: Four times a day (QID) | ORAL | 0 refills | Status: AC | PRN

## 2024-01-29 MED ORDER — PANTOPRAZOLE SODIUM 40 MG PO TBEC: 40.0000 mg | DELAYED_RELEASE_TABLET | Freq: Every day | ORAL | 0 refills | Status: AC

## 2024-01-29 MED ORDER — ALUM & MAG HYDROXIDE-SIMETH 200-200-20 MG/5ML PO SUSP
30.0000 mL | Freq: Once | ORAL | Status: AC
  Administered 2024-01-29: 30 mL via ORAL
  Filled 2024-01-29: qty 30

## 2024-01-29 NOTE — ED Notes (Signed)
 Lab called to add on hcg lab.
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
## 2024-01-29 NOTE — ED Provider Notes (Signed)
 [place] EMERGENCY DEPARTMENT AT [facility] Provider Note   CSN: [id number] Arrival date & time: 01/29/24  1345     History  Chief Complaint  Patient presents with   Chest Pain    Phyllis Dalton is a 33 y.o. female with a history of diabetes mellitus, thrombocytosis, and panic attacks presents the ED today for chest pain.  Patient reports intermittent heavy pressure at the center of her chest for the past week, pain is worse when she wakes up in the morning with associated cough.  Patient states that pain radiates down her left arm and to her back as well.  Pain is worse with stretching and with deep breathing.  Endorses family history of cardiac disease but denies any personal history.    Home Medications Prior to Admission medications   Medication Sig Start Date End Date Taking? Authorizing Provider  alum & mag hydroxide-simeth (MAALOX MAX) 400-400-40 MG/5ML suspension Take 10 mLs by mouth every 6 (six) hours as needed for up to 7 days for indigestion. 01/29/24 02/05/24 Yes Sonnie Dusky, PA-C  pantoprazole  (PROTONIX ) 40 MG tablet Take 1 tablet (40 mg total) by mouth daily for 14 days. 01/29/24 02/12/24 Yes Sonnie Dusky, PA-C  benzonatate  (TESSALON ) 100 MG capsule Take 1 capsule (100 mg total) by mouth 3 (three) times daily as needed for cough. 02/02/22   Meccariello, Joann Mu, MD  clonazePAM (KLONOPIN) 0.5 MG tablet Take 0.5 mg by mouth 2 (two) times daily as needed. 09/20/21   [provider]  cyclobenzaprine  (FLEXERIL ) 10 MG tablet Take 10 mg by mouth 3 (three) times daily as needed. 11/16/21   [provider]  guaiFENesin  (MUCINEX ) 600 MG 12 hr tablet Take 1 tablet (600 mg total) by mouth daily. 02/04/22   Rising, Ivette Marks, PA-C  oxyCODONE -acetaminophen  (PERCOCET) 10-325 MG tablet Take 1 tablet by mouth 3 (three) times daily as needed. 01/19/22   [provider]  QUEtiapine (SEROQUEL) 50 MG tablet Take 50 mg by mouth at bedtime. 01/13/22   [provider]  sertraline (ZOLOFT) 50 MG tablet Take 50 mg by mouth every morning. 09/24/21   [provider]      Allergies    Patient has no known allergies.    Review of Systems   Review of Systems  Cardiovascular:  Positive for chest pain.  All other systems reviewed and are negative.   Physical Exam Updated Vital Signs BP 105/62   Pulse 73   Temp 98 F (36.7 C) (Oral)   Resp 20   Wt 119.3 kg   LMP 12/26/2023 (Approximate)   SpO2 98%   BMI 37.74 kg/m  Physical Exam  ED Results / Procedures / Treatments   Labs (all labs ordered are listed, but only abnormal results are displayed) Labs Reviewed  CBC - Abnormal; Notable for the following components:      Result Value   MCV 79.8 (*)    MCH 25.6 (*)    Platelets 506 (*)    All other components within normal limits  RESP PANEL BY RT-PCR (RSV, FLU A&B, COVID)  RVPGX2  BASIC METABOLIC PANEL WITH GFR  HCG, QUANTITATIVE, PREGNANCY  PREGNANCY, URINE  TROPONIN T, HIGH SENSITIVITY    EKG EKG Interpretation Date/Time:  Monday January 29 2024 13:55:42 EDT Ventricular Rate:  99 PR Interval:  134 QRS Duration:  92 QT Interval:  347 QTC Calculation: 446 R Axis:   19  Text Interpretation: Sinus rhythm LVH by voltage Confirmed by Linder Revere,  Sherolyn Dixon [id number]) on 01/29/2024 3:10:18 PM  Radiology DG Chest 2 View Result Date: 01/29/2024 CLINICAL DATA:  One-week history of intermittent mid chest pain EXAM: CHEST - 2 VIEW COMPARISON:  Chest radiograph dated 01/12/2017 FINDINGS: Normal lung volumes. No focal consolidations. No pleural effusion or pneumothorax. The heart size and mediastinal contours are within normal limits. No acute osseous abnormality. IMPRESSION: No active cardiopulmonary disease. Electronically Signed   By: Limin  Xu M.D.   On: 01/29/2024 15:09    Procedures Procedures    Medications Ordered in ED Medications  alum & mag hydroxide-simeth (MAALOX/MYLANTA) 200-200-20 MG/5ML suspension 30 mL (30 mLs Oral Given  01/29/24 1526)    And  lidocaine  (XYLOCAINE ) 2 % viscous mouth solution 15 mL (15 mLs Oral Given 01/29/24 1526)    ED Course/ Medical Decision Making/ A&P                                 Medical Decision Making Amount and/or Complexity of Data Reviewed Labs: ordered. Radiology: ordered.  Risk OTC drugs. Prescription drug management.   This patient presents to the ED for concern of chest pain, this involves an extensive number of treatment options, and is a complaint that carries with it a high risk of complications and morbidity.   Differential diagnosis includes: ACS, costochondritis, GERD, muscle strain, anxiety, etc. PERC negative - low suspicion for pulmonary embolism Low suspicion for dissection - no history of hypertension, symptoms are intermittent.   Comorbidities  See HPI above   Additional History  Additional history obtained from prior records   Cardiac Monitoring / EKG  The patient was maintained on a cardiac monitor.  I personally viewed and interpreted the cardiac monitored which showed: sinus rhythm with a heart rate of 99 bpm.   Lab Tests  I ordered and personally interpreted labs.  The pertinent results include:   Negative respiratory panel Negative troponin BMP is unremarkable CBC is within normal limits Negative pregnancy test   Imaging Studies  I ordered imaging studies including CXR  I independently visualized and interpreted imaging which showed:  No active cardiopulmonary disease I agree with the radiologist interpretation   Problem List / ED Course / Critical Interventions / Medication Management  Patient has been having intermittent episodes of chest pain going to the back as well as down the left arm for the past week.  States that she does a lot of physical activity at work,and movement makes her pain worse, as well as deep breathing.  Patient reports that pain is worse in the morning and associated with coughing.  Symptoms could  possibly be due to acid reflux.  Symptoms are worse with movement, could possibly be associated with muscle strain or costochondritis. Since patient's symptoms are worse with deep breathing and with movement as well as the fact that they are intermittent, low suspicion for dissection.  She denies any personal history of hypertension or any cardiac conditions. I ordered medications including: Maalox and Lidocaine  for chest discomfort  Reevaluation of the patient after these medicines showed that the patient's chest pain improved and back pain resolved. I have reviewed the patients home medicines and have made adjustments as needed   Social Determinants of Health  Access to healthcare   Test / Admission - Considered  Discussed findings with patient. All questions answered.  She is stable and safe for discharge home. Advised close PCP follow up for reevaluation. Return precautions provided.  Final Clinical Impression(s) / ED Diagnoses Final diagnoses:  Atypical chest pain  Gastroesophageal reflux disease without esophagitis    Rx / DC Orders ED Discharge Orders          Ordered    alum & mag hydroxide-simeth (MAALOX MAX) 400-400-40 MG/5ML suspension  Every 6 hours PRN        01/29/24 1646    pantoprazole  (PROTONIX ) 40 MG tablet  Daily        01/29/24 1646              Sonnie Dusky, PA-C 01/29/24 1649    Sallyanne Creamer, DO 02/01/24 2234

## 2024-01-29 NOTE — Discharge Instructions (Addendum)
 As discussed, your labs and imaging are reassuring.  Your symptoms could be due to acid reflux as they are worse in the morning and associated with coughing.  Take Protonix  40 mg once a day in the morning.  You can take Maalox every 6 hours as needed for additional pain relief.  Follow-up with your primary care provider in the next week for reevaluation.  Return to the ED if your symptoms worsen in the interim.

## 2024-05-16 ENCOUNTER — Encounter (HOSPITAL_BASED_OUTPATIENT_CLINIC_OR_DEPARTMENT_OTHER): Payer: Self-pay

## 2024-05-16 ENCOUNTER — Emergency Department (HOSPITAL_BASED_OUTPATIENT_CLINIC_OR_DEPARTMENT_OTHER)
Admission: EM | Admit: 2024-05-16 | Discharge: 2024-05-16 | Discharge status: Against Medical Advice | Attending: Emergency Medicine | Admitting: Emergency Medicine

## 2024-05-16 ENCOUNTER — Other Ambulatory Visit: Payer: Self-pay

## 2024-05-16 DIAGNOSIS — N611 Abscess of the breast and nipple: Secondary | ICD-10-CM | POA: Diagnosis present

## 2024-05-16 DIAGNOSIS — Z5321 Procedure and treatment not carried out due to patient leaving prior to being seen by health care provider: Secondary | ICD-10-CM | POA: Diagnosis not present

## 2024-05-16 NOTE — ED Triage Notes (Signed)
 Pt states she has had an abscess under rt. Breast x 1 week

## 2024-05-17 ENCOUNTER — Emergency Department (HOSPITAL_COMMUNITY)
Admission: EM | Admit: 2024-05-17 | Discharge: 2024-05-17 | Disposition: A | Discharge status: Home / Self Care | Attending: Emergency Medicine | Admitting: Emergency Medicine

## 2024-05-17 DIAGNOSIS — N611 Abscess of the breast and nipple: Secondary | ICD-10-CM | POA: Insufficient documentation

## 2024-05-17 DIAGNOSIS — N61 Mastitis without abscess: Secondary | ICD-10-CM | POA: Diagnosis not present

## 2024-05-17 DIAGNOSIS — E119 Type 2 diabetes mellitus without complications: Secondary | ICD-10-CM | POA: Insufficient documentation

## 2024-05-17 DIAGNOSIS — L02213 Cutaneous abscess of chest wall: Secondary | ICD-10-CM

## 2024-05-17 MED ORDER — DOXYCYCLINE HYCLATE 100 MG PO CAPS: 100.0000 mg | ORAL_CAPSULE | Freq: Two times a day (BID) | ORAL | 0 refills | Status: AC

## 2024-05-17 MED ORDER — LIDOCAINE-EPINEPHRINE (PF) 2 %-1:200000 IJ SOLN
10.0000 mL | Freq: Once | INTRAMUSCULAR | Status: AC
Start: 2024-05-17 — End: 2024-05-17
  Administered 2024-05-17: 5 mL
  Filled 2024-05-17: qty 20

## 2024-05-17 NOTE — ED Provider Notes (Signed)
 [place] EMERGENCY DEPARTMENT AT [facility] Provider Note   CSN: [id number] Arrival date & time: 05/17/24  9150     Patient presents with: Lung Abcess   Phyllis Dalton is a 33 y.o. female.   Patient is a 33 year old female with a history of a panic attack, diabetes who is presenting today with 2 weeks of worsening abscess under the right breast.  She reports she has been doing warm soaks but it is not helping and only getting worse.  Is very painful at this time and now she has noticed spreading redness onto her breast.  It has not drained at all but she has had this happen in the past.  The history is provided by the patient.       Prior to Admission medications   Medication Sig Start Date End Date Taking? Authorizing Provider  doxycycline  (VIBRAMYCIN ) 100 MG capsule Take 1 capsule (100 mg total) by mouth 2 (two) times daily. 05/17/24  Yes Doretha Folks, MD  benzonatate  (TESSALON ) 100 MG capsule Take 1 capsule (100 mg total) by mouth 3 (three) times daily as needed for cough. 02/02/22   Meccariello, Con PARAS, MD  clonazePAM (KLONOPIN) 0.5 MG tablet Take 0.5 mg by mouth 2 (two) times daily as needed. 09/20/21   [provider]  cyclobenzaprine  (FLEXERIL ) 10 MG tablet Take 10 mg by mouth 3 (three) times daily as needed. 11/16/21   [provider]  guaiFENesin  (MUCINEX ) 600 MG 12 hr tablet Take 1 tablet (600 mg total) by mouth daily. 02/04/22   Rising, Asberry, PA-C  oxyCODONE -acetaminophen  (PERCOCET) 10-325 MG tablet Take 1 tablet by mouth 3 (three) times daily as needed. 01/19/22   [provider]  pantoprazole  (PROTONIX ) 40 MG tablet Take 1 tablet (40 mg total) by mouth daily for 14 days. 01/29/24 02/12/24  Waddell Sluder, PA-C  QUEtiapine (SEROQUEL) 50 MG tablet Take 50 mg by mouth at bedtime. 01/13/22   [provider]  sertraline (ZOLOFT) 50 MG tablet Take 50 mg by mouth every morning. 09/24/21   [provider]    Allergies:  Patient has no known allergies.    Review of Systems  Updated Vital Signs BP (!) 149/105 (BP Location: Left Arm) Comment: Simultaneous filing. User may not have seen previous data. Comment (BP Location): Simultaneous filing. User may not have seen previous data.  Pulse (!) 104 Comment: Simultaneous filing. User may not have seen previous data.  Temp 98.7 F (37.1 C) (Oral)   Resp 18 Comment: Simultaneous filing. User may not have seen previous data.  Ht 5' 10 (1.778 m)   Wt 120.2 kg   LMP 05/05/2024 (Exact Date)   SpO2 100% Comment: Simultaneous filing. User may not have seen previous data.  BMI 38.02 kg/m   Physical Exam Vitals and nursing note reviewed.  Constitutional:      General: She is not in acute distress.    Appearance: She is well-developed.  HENT:     Head: Normocephalic and atraumatic.  Eyes:     Pupils: Pupils are equal, round, and reactive to light.  Cardiovascular:     Rate and Rhythm: Normal rate.  Pulmonary:     Effort: Pulmonary effort is normal. No respiratory distress.  Chest:    Musculoskeletal:        General: No tenderness. Normal range of motion.     Comments: No edema  Skin:    General: Skin is warm and dry.     Findings: No rash.  Neurological:     Mental Status: She is alert and oriented to person, place, and time.     Cranial Nerves: No cranial nerve deficit.  Psychiatric:        Behavior: Behavior normal.     (all labs ordered are listed, but only abnormal results are displayed) Labs Reviewed - No data to display  EKG: None  Radiology: No results found.   Procedures  INCISION AND DRAINAGE Performed by: Benton Shone Consent: Verbal consent obtained. Risks and benefits: risks, benefits and alternatives were discussed Type: abscess  Body area: right chest wall under the breast  Anesthesia: local infiltration  Incision was made with a scalpel.  Local anesthetic: lidocaine  1% with epinephrine   Anesthetic total: 6  ml  Complexity: complex Blunt dissection to break up loculations  Drainage: purulent  Drainage amount: 25mL  Packing material: 1/4 in iodoform gauze  Patient tolerance: Patient tolerated the procedure well with no immediate complications.   Medications Ordered in the ED  lidocaine -EPINEPHrine  (XYLOCAINE  W/EPI) 2 %-1:200000 (PF) injection 10 mL (5 mLs Infiltration Given by Other 05/17/24 0949)                                    Medical Decision Making Risk Prescription drug management.  Patient presenting today with an abscess and localized cellulitis.  I&D as above with significant purulent drainage.  Patient is not systemically ill at this time.  Will start doxycycline  for the surrounding cellulitis patient has instructions for post I&D care.  She was given return precautions.  She is comfortable with this plan and ready to go home.      Final diagnoses:  Abscess of chest wall  Cellulitis of right breast    ED Discharge Orders          Ordered    doxycycline  (VIBRAMYCIN ) 100 MG capsule  2 times daily        05/17/24 0955               Shone Benton, MD 05/17/24 1015

## 2024-05-17 NOTE — Discharge Instructions (Addendum)
 If the packing is still present in 3 days you can pull it out.  It is still okay to shower and run warm water over the area.  Start the antibiotic to help with the redness.  If anything starts getting worse and not better return to the emergency room

## 2024-05-17 NOTE — ED Triage Notes (Addendum)
 C/o abscess under right breast. No drainage, fevers, N/V. Hx of same in past requiring drainage. Also c/o dental pain on the left lower side. Sensitive to temperature changes.
